# Patient Record
Sex: Male | Born: 1935 | Race: White | Hispanic: No | Smoking: Former smoker
Health system: Southern US, Community
[De-identification: ages and names within clinical notes are randomized; demographics above are authoritative.]

## PROBLEM LIST (undated history)

## (undated) DIAGNOSIS — Z8739 Personal history of other diseases of the musculoskeletal system and connective tissue: Secondary | ICD-10-CM

## (undated) DIAGNOSIS — N400 Enlarged prostate without lower urinary tract symptoms: Secondary | ICD-10-CM

## (undated) DIAGNOSIS — G5600 Carpal tunnel syndrome, unspecified upper limb: Secondary | ICD-10-CM

## (undated) DIAGNOSIS — K219 Gastro-esophageal reflux disease without esophagitis: Secondary | ICD-10-CM

## (undated) DIAGNOSIS — F419 Anxiety disorder, unspecified: Secondary | ICD-10-CM

## (undated) DIAGNOSIS — M25521 Pain in right elbow: Secondary | ICD-10-CM

## (undated) DIAGNOSIS — I951 Orthostatic hypotension: Secondary | ICD-10-CM

## (undated) DIAGNOSIS — I480 Paroxysmal atrial fibrillation: Secondary | ICD-10-CM

## (undated) DIAGNOSIS — N529 Male erectile dysfunction, unspecified: Secondary | ICD-10-CM

## (undated) DIAGNOSIS — R51 Headache: Secondary | ICD-10-CM

## (undated) DIAGNOSIS — G8929 Other chronic pain: Secondary | ICD-10-CM

## (undated) DIAGNOSIS — W57XXXA Bitten or stung by nonvenomous insect and other nonvenomous arthropods, initial encounter: Secondary | ICD-10-CM

## (undated) DIAGNOSIS — E782 Mixed hyperlipidemia: Secondary | ICD-10-CM

## (undated) DIAGNOSIS — M542 Cervicalgia: Secondary | ICD-10-CM

## (undated) DIAGNOSIS — E039 Hypothyroidism, unspecified: Secondary | ICD-10-CM

## (undated) DIAGNOSIS — M1990 Unspecified osteoarthritis, unspecified site: Secondary | ICD-10-CM

## (undated) DIAGNOSIS — R001 Bradycardia, unspecified: Secondary | ICD-10-CM

## (undated) DIAGNOSIS — I10 Essential (primary) hypertension: Secondary | ICD-10-CM

## (undated) DIAGNOSIS — I2510 Atherosclerotic heart disease of native coronary artery without angina pectoris: Secondary | ICD-10-CM

## (undated) DIAGNOSIS — E119 Type 2 diabetes mellitus without complications: Secondary | ICD-10-CM

## (undated) HISTORY — DX: Type 2 diabetes mellitus without complications: E11.9

## (undated) HISTORY — PX: CARPAL TUNNEL RELEASE: SHX101

## (undated) HISTORY — PX: OTHER SURGICAL HISTORY: SHX169

## (undated) HISTORY — DX: Male erectile dysfunction, unspecified: N52.9

## (undated) HISTORY — DX: Bradycardia, unspecified: R00.1

## (undated) HISTORY — DX: Essential (primary) hypertension: I10

## (undated) HISTORY — DX: Hypothyroidism, unspecified: E03.9

## (undated) HISTORY — PX: HERNIA REPAIR: SHX51

## (undated) HISTORY — PX: CARDIAC CATHETERIZATION: SHX172

## (undated) HISTORY — DX: Atherosclerotic heart disease of native coronary artery without angina pectoris: I25.10

## (undated) HISTORY — PX: CERVICAL FUSION: SHX112

## (undated) HISTORY — DX: Orthostatic hypotension: I95.1

## (undated) HISTORY — DX: Mixed hyperlipidemia: E78.2

## (undated) HISTORY — DX: Paroxysmal atrial fibrillation: I48.0

---

## 1999-11-19 ENCOUNTER — Encounter: Payer: Self-pay | Admitting: Neurosurgery

## 1999-11-21 ENCOUNTER — Inpatient Hospital Stay (HOSPITAL_COMMUNITY): Admission: RE | Admit: 1999-11-21 | Discharge: 1999-11-22 | Payer: Self-pay | Admitting: Neurosurgery

## 1999-11-21 ENCOUNTER — Encounter: Payer: Self-pay | Admitting: Neurosurgery

## 2000-03-01 ENCOUNTER — Encounter: Admission: RE | Admit: 2000-03-01 | Discharge: 2000-03-01 | Payer: Self-pay | Admitting: Neurosurgery

## 2000-03-01 ENCOUNTER — Encounter: Payer: Self-pay | Admitting: Neurosurgery

## 2004-02-28 ENCOUNTER — Inpatient Hospital Stay (HOSPITAL_BASED_OUTPATIENT_CLINIC_OR_DEPARTMENT_OTHER): Admission: RE | Admit: 2004-02-28 | Discharge: 2004-02-28 | Payer: Self-pay | Admitting: Cardiology

## 2004-03-03 ENCOUNTER — Inpatient Hospital Stay (HOSPITAL_COMMUNITY): Admission: RE | Admit: 2004-03-03 | Discharge: 2004-03-06 | Payer: Self-pay | Admitting: *Deleted

## 2004-03-19 ENCOUNTER — Ambulatory Visit: Payer: Self-pay | Admitting: Cardiology

## 2004-03-24 ENCOUNTER — Ambulatory Visit: Payer: Self-pay | Admitting: Cardiology

## 2004-06-24 ENCOUNTER — Ambulatory Visit: Payer: Self-pay | Admitting: Cardiology

## 2004-10-17 ENCOUNTER — Ambulatory Visit: Payer: Self-pay | Admitting: *Deleted

## 2004-10-17 ENCOUNTER — Inpatient Hospital Stay (HOSPITAL_BASED_OUTPATIENT_CLINIC_OR_DEPARTMENT_OTHER): Admission: RE | Admit: 2004-10-17 | Discharge: 2004-10-17 | Payer: Self-pay | Admitting: *Deleted

## 2004-10-31 ENCOUNTER — Ambulatory Visit: Payer: Self-pay | Admitting: Cardiology

## 2005-01-30 ENCOUNTER — Ambulatory Visit: Payer: Self-pay | Admitting: Cardiology

## 2005-05-27 ENCOUNTER — Ambulatory Visit: Payer: Self-pay | Admitting: Cardiology

## 2006-08-16 ENCOUNTER — Ambulatory Visit: Payer: Self-pay | Admitting: Physician Assistant

## 2006-08-16 ENCOUNTER — Encounter: Payer: Self-pay | Admitting: Cardiology

## 2007-04-14 ENCOUNTER — Ambulatory Visit: Payer: Self-pay | Admitting: Cardiology

## 2007-04-18 ENCOUNTER — Encounter: Payer: Self-pay | Admitting: Cardiology

## 2007-05-02 ENCOUNTER — Encounter: Payer: Self-pay | Admitting: Cardiology

## 2007-08-11 ENCOUNTER — Encounter: Payer: Self-pay | Admitting: Cardiology

## 2008-03-07 ENCOUNTER — Ambulatory Visit: Payer: Self-pay | Admitting: Cardiology

## 2009-03-28 ENCOUNTER — Ambulatory Visit: Payer: Self-pay | Admitting: Cardiology

## 2009-03-28 DIAGNOSIS — F528 Other sexual dysfunction not due to a substance or known physiological condition: Secondary | ICD-10-CM | POA: Insufficient documentation

## 2009-03-28 DIAGNOSIS — E785 Hyperlipidemia, unspecified: Secondary | ICD-10-CM

## 2009-03-28 DIAGNOSIS — I2510 Atherosclerotic heart disease of native coronary artery without angina pectoris: Secondary | ICD-10-CM | POA: Insufficient documentation

## 2009-03-28 DIAGNOSIS — I259 Chronic ischemic heart disease, unspecified: Secondary | ICD-10-CM | POA: Insufficient documentation

## 2009-03-28 DIAGNOSIS — I10 Essential (primary) hypertension: Secondary | ICD-10-CM | POA: Insufficient documentation

## 2009-03-28 DIAGNOSIS — E119 Type 2 diabetes mellitus without complications: Secondary | ICD-10-CM | POA: Insufficient documentation

## 2009-03-28 DIAGNOSIS — E039 Hypothyroidism, unspecified: Secondary | ICD-10-CM | POA: Insufficient documentation

## 2009-10-21 ENCOUNTER — Ambulatory Visit: Payer: Self-pay | Admitting: Cardiology

## 2010-06-12 NOTE — Assessment & Plan Note (Signed)
Summary: 6 mo fu per may reminder   Visit Type:  Follow-up Primary Provider:  none   History of Present Illness: the patient is a 77 year old male with a history of drug-eluting stent placement in 2005. The patient had nonobstructive coronary disease by catheterization in 2006. He is doing quite well. He denies any chest pain shortness of breath orthopnea PND. 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 He has not noticed any decrease in exercise tolerance. He reports no palpitations or syncope. The patient does report that he has frequent tick bites, but reports no fever or chills. He also reports no headache. The patient is intolerant to statins due to severe myalgia and weakness. His blood pressure is well under control. He was elevated in the office today but the patient takes his blood pressure at home several times during the week and it has been within normal limits.  Preventive Screening-Counseling & Management  Alcohol-Tobacco     Smoking Status: quit     Year Quit: 1970  Current Medications (verified): 1)  Actos 45 Mg Tabs (Pioglitazone Hcl) .... Take 1  Tablet By Mouth Once A Day 2)  Aspirin 81 Mg Tbec (Aspirin) .... Take 1 Tablet By Mouth Once A Day 3)  Nexium 40 Mg Cpdr (Esomeprazole Magnesium) .... Take 1 Tablet By Mouth Once A Day 4)  Levothyroxine Sodium 75 Mcg Tabs (Levothyroxine Sodium) .... Take 1 Tablet By Mouth Once A Day 5)  Cialis 10 Mg Tabs (Tadalafil) .... Take One Tab X One Prior To Sexual Activity.  Maximum of 20mg  in A 24 Hour Time Frame. 6)  Losartan Potassium 50 Mg Tabs (Losartan Potassium) .... Take 1 Tablet By Mouth Once A Day 7)  Hydrochlorothiazide 25 Mg Tabs (Hydrochlorothiazide) .... Take One Tablet By Mouth Daily. 8)  Amlodipine Besylate 5 Mg Tabs (Amlodipine Besylate) .... Take 1 Tablet By Mouth Once A Day  Allergies (verified): 1)  * Statins 2)  * Zetia  Comments:  Nurse/Medical Assistant: The patient's medication list and  allergies were reviewed with the patient and were updated in the Medication and Allergy Lists.  Past History:  Past Medical History: Last updated: 03/28/2009 1. Coronary artery disease.  The patient is stable.  He does not need     any testing at this time. 2. History of drug-eluting stent to the OM and right coronary in 2005     with followup cath in 2006 with only minimal disease and no     significant stent stenoses. 3. Dyslipidemia.  He is intolerant to statins and Zetia and red yeast     rice.  I have recommended fish oil to him. 4. Normal LV function. 5. Diabetes. 6. Asymptomatic sinus bradycardia and therefore he is off beta-blocker     therapy. 7. Hypothyroidism.  This is treated by Dr. Virgina Organ. 8. Hypertension.  Blood pressure is nicely controlled. 9. History of erectile dysfunction.   Family History: Last updated: 03/06/2008 noncontributory  Social History: Last updated: 03/06/2008 Tobacco Use - Yes.   Risk Factors: Smoking Status: quit (10/21/2009)  Vital Signs:  Patient profile:   77 year old male Height:      72 inches Weight:      237 pounds Pulse rate:   61 / minute BP sitting:   150 / 79  (left arm) Cuff size:   large  Vitals Entered By: Carlye Grippe (October 21, 2009 9:14 AM)  Physical Exam  Additional Exam:  General: Well-developed,  well-nourished in no distress head: Normocephalic and atraumatic eyes PERRLA/EOMI intact, conjunctiva and lids normal nose: No deformity or lesions mouth normal dentition, normal posterior pharynx neck: Supple, no JVD.  No masses, thyromegaly or abnormal cervical nodes lungs: Normal breath sounds bilaterally without wheezing.  Normal percussion heart: regular rate and rhythm with normal S1 and S2, no S3 or S4.  PMI is normal.  No pathological murmurs abdomen: Normal bowel sounds, abdomen is soft and nontender without masses, organomegaly or hernias noted.  No hepatosplenomegaly musculoskeletal: Back normal, normal  gait muscle strength and tone normal pulsus: Pulse is normal in all 4 extremities Extremities: No peripheral pitting edema neurologic: Alert and oriented x 3 skin: Intact without lesions or rashes cervical nodes: No significant adenopathy psychologic: Normal affect    Impression & Recommendations:  Problem # 1:  ESSENTIAL HYPERTENSION, BENIGN (ICD-401.1) controlled and continue current medical regimen. The following medications were removed from the medication list:    Nifedical Xl 60 Mg Xr24h-tab (Nifedipine) .Marland Kitchen... Take 1 tablet by mouth once a day    Diovan Hct 160-12.5 Mg Tabs (Valsartan-hydrochlorothiazide) .Marland Kitchen... Take 1 tablet by mouth once a day His updated medication list for this problem includes:    Aspirin 81 Mg Tbec (Aspirin) .Marland Kitchen... Take 1 tablet by mouth once a day    Losartan Potassium 50 Mg Tabs (Losartan potassium) .Marland Kitchen... Take 1 tablet by mouth once a day    Hydrochlorothiazide 25 Mg Tabs (Hydrochlorothiazide) .Marland Kitchen... Take one tablet by mouth daily.    Amlodipine Besylate 5 Mg Tabs (Amlodipine besylate) .Marland Kitchen... Take 1 tablet by mouth once a day  Problem # 2:  CORONARY ARTERY DISEASE, S/P PTCA (ICD-414.9) the patient has no symptoms of chest pain or shortness of breath. He has good exercise tolerance. I do not see any real reason to perform a stress test. The following medications were removed from the medication list:    Nifedical Xl 60 Mg Xr24h-tab (Nifedipine) .Marland Kitchen... Take 1 tablet by mouth once a day His updated medication list for this problem includes:    Aspirin 81 Mg Tbec (Aspirin) .Marland Kitchen... Take 1 tablet by mouth once a day    Amlodipine Besylate 5 Mg Tabs (Amlodipine besylate) .Marland Kitchen... Take 1 tablet by mouth once a day  Problem # 3:  DYSLIPIDEMIA (ICD-272.4) the patient is a statin intolerance is also intolerant to a red yeast rice.  Patient Instructions: 1)  Your physician wants you to follow-up in: 1 year. You will receive a reminder letter in the mail one-two months in  advance. If you don't receive a letter, please call our office to schedule the follow-up appointment.  2)  Your physician recommends that you continue on your current medications as directed. Please refer to the Current Medication list given to you today. Prescriptions: AMLODIPINE BESYLATE 5 MG TABS (AMLODIPINE BESYLATE) Take 1 tablet by mouth once a day  #31 x 12   Entered by:   Cyril Loosen, RN, BSN   Authorized by:   Lewayne Bunting, MD, [facility]   Signed by:   Cyril Loosen, RN, BSN on 10/21/2009   Method used:   Electronically to        [facility]* (retail)       [address]       [place], [place]  [address]       Ph: [phone]       Fax: [phone]   RxID:   [id number] HYDROCHLOROTHIAZIDE  25 MG TABS (HYDROCHLOROTHIAZIDE) Take one tablet by mouth daily.  #31 x 12   Entered by:   Cyril Loosen, RN, BSN   Authorized by:   Lewayne Bunting, MD, [facility]   Signed by:   Cyril Loosen, RN, BSN on 10/21/2009   Method used:   Electronically to        [facility]* (retail)       [address]       [place], [place]  [address]       Ph: [phone]       Fax: [phone]   RxID:   [id number] LOSARTAN POTASSIUM 50 MG TABS (LOSARTAN POTASSIUM) Take 1 tablet by mouth once a day  #31 x 12   Entered by:   Cyril Loosen, RN, BSN   Authorized by:   Lewayne Bunting, MD, [facility]   Signed by:   Cyril Loosen, RN, BSN on 10/21/2009   Method used:   Electronically to        [facility]* (retail)       [address]       [place], [place]  [address]       Ph: [phone]       Fax: [phone]   RxID:   [id number]

## 2010-09-23 NOTE — Assessment & Plan Note (Signed)
[facility]                          [facility] NOTE   NAME:Adam Peck, Adam Peck                        MRN:          [id number]  DATE:04/14/2007                            DOB:          Jul 23, 1933    REASON FOR VISIT:  Six-month followup.   HISTORY OF PRESENT ILLNESS:  [name] is a 77 year old male patient  with a history of coronary artery disease, status post drug-eluting  stent to the obtuse marginal and RCA in October 2005, who presents to  the office today for followup.  Overall, he is doing well.  He denies  any chest discomfort since last being seen.  He denies any significant  exertional dyspnea.  He describes NYHA Class I-II symptoms.  He denies  orthopnea, PND or pedal edema.  He denies any syncope or palpitations.  He does have some difficulty with erectile dysfunction.  He can obtain  an erection, but cannot maintain it.  He denies any numbness or  difficulty with ejaculation.   CURRENT MEDICATIONS:  1. Fenofibrate 160 mg daily.  2. Plavix 75 mg daily.  3. Actos 45 mg half tablet daily.  4. Nifedical XL 60 mg daily.  5. Aspirin 81 mg daily.  6. Levothyroxine 50 mcg daily.  7. Red yeast rice.  8. Cinnamon.  9. Benicar/HCTZ 40/12.5 mg half a tablet a day.  10.Nexium 40 mg daily.  11.Nitroglycerin p.r.n. chest pain.   ALLERGIES:  No known drug allergies.  He is intolerant to all STATINS  AND ZETIA.   PHYSICAL EXAMINATION:  GENERAL:  He is a well-developed, well-nourished  male.  VITAL SIGNS:  Blood pressure 151/79, pulse 54, weight 236.4 pounds.  HEENT:  Normal.  NECK:  Without JVD.  CARDIAC:  Normal S1, S2.  Regular rate and rhythm without murmur.  LUNGS:  Clear to auscultation bilaterally without wheezes, rhonchi or  rales.  ABDOMEN:  Soft, nontender with normoactive bowel sounds.  No  organomegaly.  EXTREMITIES:  Without edema.  NEUROLOGIC:  He is alert and oriented x3.  Cranial nerves 2-12 grossly  intact.  VASCULAR:  Without carotid bruits bilaterally.   IMPRESSION:  1. Coronary artery disease.      a.     Status post drug-eluting stent placement to the obtuse       marginal and right coronary artery in 2005.      b.     No evidence of restenosis by elective cardiac       catheterization in June 2006, as part of the [facility] protocol.  2. Dyslipidemia.  Intolerant to Statins and Zetia in the past.  3. Preserved left ventricular function with ejection fraction of 60%.  4. Diabetes mellitus.  5. Asymptomatic sinus bradycardia, off of beta-blocker now.  6. Treated hypothyroidism.  7. Hypertension.  8. Erectile dysfunction.   The patient is doing well from a cardiovascular standpoint.  He reports  no chest pain or significant shortness of breath.  He does have  difficulty with erectile dysfunction and is requesting Cialis at this  point in time.   PLAN:  1. Increase  his Benicar/HCTZ to 40/12.5 mg one tablet daily for better      blood pressure control.  2. Initiate Cialis 5 mg daily p.r.n., #10, one refill.  I have      explained to him that he cannot take nitrates with this.  He      understands the dangers.  3. He will need a BMET in a week to follow up on his renal function      and potassium with increase in his Benicar/HCTZ.  Will also check a      testosterone level at that time.  4. Follow up in 6 months or sooner p.r.n.      Tereso Newcomer, PA-C  Electronically Signed      Learta Codding, MD,FACC  Electronically Signed   SW/MedQ  DD: 04/14/2007  DT: 04/14/2007  Job #: [id number]   cc:   [place]

## 2010-09-23 NOTE — Assessment & Plan Note (Signed)
[facility]                          [facility]ICE NOTE   NAME:Silverman, BRAXSON HOLLINGSWORTH                        MRN:          [id number]  DATE:03/07/2008                            DOB:          09/26/1933    Mr. Lynes is followed primarily by Dr. Andee Lineman.  I am seeing him for  followup for our office and Dr. Andee Lineman today.  Mr. Mirsky is actually  doing well.  He is seen for the followup of coronary disease and  dyslipidemia and hypertension and asymptomatic sinus bradycardia.  The  patient has drug-eluting stents that were placed in 2005.  Followup cath  in 2006 revealed no major stenoses of the stents.  He is fully active.  He is not having any chest pain or significant shortness of breath.  Unfortunately, the patient is intolerant to statins and Zetia, and in  fact, he felt poorly with red yeast rice.  I have discussed this with  him.  See the note below.  The patient has a history of sinus  bradycardia.  This is asymptomatic.  He has not had any syncope or  presyncope.  The patient does have hypertension.  He is on appropriate  medications and feeling well.  See the physical exam below.   PAST MEDICAL HISTORY:   ALLERGIES:  He feels poorly with CHOLESTEROL MEDS, but has no definite  allergies.   MEDICATIONS:  1. Plavix 75 mg (to be stopped at this time).  2. Actos 45 mg one-half tablet daily.  3. Nifedical 60.  4. Aspirin 81.  5. Nexium 40.  6. Flomax 0.4.  7. Ramipril 10.  8. Levothyroxine 75 mcg.   OTHER MEDICAL PROBLEMS:  See the complete list below.   REVIEW OF SYSTEMS:  At this time, he is not having any GI or GU  symptoms.  He has no skin rashes or headaches.  His review of systems  otherwise is negative.  The patient is having some difficulty with one  of his knees.  He also mentions vague discomfort in his right wrist.  This does not seem to be vascular in origin.   PHYSICAL EXAMINATION:  VITAL SIGNS:  Blood pressure today is  112/60.  Heart rate is 84.  Weight is stable at 237 pounds.  GENERAL:  The patient is oriented to person, time, and place.  Affect is  normal.  HEENT:  No xanthelasma.  He has normal extraocular motion.  NECK:  There are no carotid bruits.  There is no jugular venous  distention.  LUNGS:  Clear.  Respiratory effort is not labored.  CARDIAC:  An S1 with an S2.  There are no clicks or significant murmurs.  ABDOMEN:  Soft.  There are no masses or bruits.  EXTREMITIES:  He has no peripheral edema.  The patient has normal pulse  at the right wrist.   LABORATORY DATA:  No labs are done today.   PROBLEMS:  1. Coronary artery disease.  The patient is stable.  He does not need      any testing at this time.  2.  History of drug-eluting stent to the OM and right coronary in 2005      with followup cath in 2006 with only minimal disease and no      significant stent stenoses.  3. Dyslipidemia.  He is intolerant to statins and Zetia and red yeast      rice.  I have recommended fish oil to him.  4. Normal LV function.  5. Diabetes.  6. Asymptomatic sinus bradycardia and therefore he is off beta-blocker      therapy.  7. Hypothyroidism.  This is treated by Dr. Virgina Organ.  8. Hypertension.  Blood pressure is nicely controlled.  9. History of erectile dysfunction.   I have had a very careful discussion about the pros and cons of  continuing Plavix.  He is now 4 years post procedure.  He did not have  complex bifurcation lesions or left main lesions.  Therefore, I feel it  is safe for him to stop his Plavix.  He will let his current dose run  out and not have it refilled.   He will obtain some fish oil and we will see him back in 1 year for  followup.     Luis Abed, MD, [facility]  Electronically Signed    JDK/MedQ  DD: 03/07/2008  DT: 03/07/2008  Job #: [id number]   cc:   Colon Branch, MD

## 2010-09-26 NOTE — Cardiovascular Report (Signed)
NAME:  KAULDER, ZAHNER                 ACCOUNT NO.:  [id number]   MEDICAL RECORD NO.:  [id number]          PATIENT TYPE:  OIB   LOCATION:  6501                         FACILITY:  [facility]   PHYSICIAN:  Carole Binning, M.D. LHCDATE OF BIRTH:  1934/01/02   DATE OF PROCEDURE:  10/17/2004  DATE OF DISCHARGE:                              CARDIAC CATHETERIZATION   PROCEDURE PERFORMED:  Left heart catheterization with coronary angiography  and left ventriculography.   INDICATIONS:  The patient is a 77 year old male coronary artery disease. In  October of 2005, he underwent two-vessel stent placement. He had a drug-  eluting stent placed in the first obtuse marginal branch. He then had a  staged intervention with a drug-eluting stent placed in the distal right  coronary artery. The stent in the distal right coronary was placed as part  of the COSTAR study. The patient returns today for study protocol mandated  coronary angiography. Since the stent placement, he has had occasional  intermittent chest pain which has been nonexertional in nature.   PROCEDURE NOTE:  A 6-French sheath was placed in the right femoral artery.  The left coronaries were imaged with a 6-French JL-5 catheter. Right  coronary artery is imaged with a 6-French JR-4 catheter. There is mild  catheter induced spasm of the proximal right coronary artery which was  relieved with intracoronary nitroglycerin. Left ventriculography was  performed with an angled pigtail catheter. Contrast was Omnipaque. At the  conclusion of procedure, a 6-French Angio-Seal vascular closure device was  placed in the right femoral artery with good hemostasis. There are no  complications.   RESULTS:  1.  Hemodynamics: Left ventricular pressure 134/22. Aortic pressure 136/78.      There is no aortic valve gradient.  2.  Left ventriculogram: Wall motion is normal, ejection fraction is      estimated greater than or equal to 60%. There is no mitral    regurgitation.  3.  Coronary arteriography:      1.  Left main is normal.      2.  Left anterior descending artery has a diffuse 50-60% stenosis in the          mid vessel. The distal vessel has a 20% stenosis. The LAD gives rise          to single large diagonal branch.      3.  Left circumflex has a 20% stenosis in the proximal vessel just after          the origin of the first obtuse marginal. The first obtuse marginal,          itself, is a large vessel. There is a stent in the very proximal          portion of the obtuse marginal which is widely patent with 0%          stenosis within the stent. Just distal to the stent, there is a 20%          stenosis. The circumflex also gives rise to large second obtuse  marginal branch. There is a 20% stenosis at the origin of the second          obtuse marginal and tubular 40% stenosis in the mid body of the          second obtuse marginal branch. There is a small third obtuse          marginal branch arising from the distal left circumflex.      4.  Right coronary artery is dominant vessel. There is significant          tortuosity of the proximal mid vessel. In the proximal vessel, there          is a 30% stenosis. In the mid vessel, there is a 40% stenosis. There          is a stent in the distal vessel at the acute margin. The stent is          widely patent with 0% stenosis within the stent. The distal right          coronary gives rise to large posterior descending artery and a          series of small posterolateral branches. The posterior descending          artery has a 20% stenosis proximally and a 30% stenosis in the mid          portion.   IMPRESSION:  1.  Normal left ventricular systolic function.  2.  Patent stents in the obtuse marginal branch in the right coronary      artery.  3.  Moderate residual but nonobstructive coronary artery disease.   PLAN:  The patient will be continued on medical therapy.        MWP/MEDQ  D:  10/17/2004  T:  10/17/2004  Job:  [id number]   cc:   [facility]   Earnestine Mealing, M.D.  [place], [place]

## 2010-09-26 NOTE — Assessment & Plan Note (Signed)
[name]                          [facility] OFFICE NOTE   NAME:[name], Adam Peck                        MRN:          [id number]  DATE:08/16/2006                            DOB:          29-Feb-1934    PRIMARY CARE PHYSICIANS:  Primary care physician is Dr. Prescott Parma.  Cardiologist is Learta Codding, M.D.   HISTORY OF PRESENT ILLNESS:  [name] is a very pleasant 77 year old  male patient followed by Dr. Andee Lineman with history of coronary artery  disease, status post drug-eluting stent placement to the obtuse marginal  and right coronary artery in October 2005 with a drug-eluting stent who  had no evidence of re-stenosis at followup catheterization June of 2006.  The patient presents back to the office today for routine followup.  He  is doing well overall.  He notes occasional left sided chest discomfort  that is atypical.  He says that this is associated with eating certain  types of meals, especially peanuts. He says he does have a history of  hiatal hernia.  He denies any exertional symptoms.  He denies any  exertional shortness of breath, syncope or near syncope.  He denies any  nausea or diaphoresis.  He denies any orthopnea or paroxysmal nocturnal  dyspnea.  He says he checks his blood pressures at home and they are  usually in the 120/80 range  He says he is cold a lot of the time. He  says he recently had laboratory work with Dr. Dyann Ruddle which included a  survey of his thyroid replacement.  He sees Dr. Dyann Ruddle back next week  for routine followup.   CURRENT MEDICATIONS:  1. Actos 45 mg one-half tablet daily.  2. Nexium 40 mg daily.  3. Plavix 75 mg daily.  4. Aspirin 81 mg daily.  5. Levothyroxine 50 mcg one-half tablet daily.  6. Metoprolol tartrate 50 mg; he is only taking this once a day.  7. Nifedical 60 mg daily.   ALLERGIES:  He has significant myalgias secondary to all STATIN's and  ZETIA.   PHYSICAL EXAMINATION:   GENERAL APPEARANCE:  He is a well-nourished, well-  developed male in no acute distress.  VITAL SIGNS:  Blood pressure 156/84, pulse 51, weight 238 pounds.  Repeat blood pressure by me is 150/90 on the left, 146/90 on the right.  HEENT:  Unremarkable.  NECK: Without jugular venous distention.  CARDIAC:  Normal S1 and S2.  Regular rate and rhythm without murmurs.  LUNGS:  Clear to auscultation bilaterally without wheezing, rhonchi or  rales.  Carotids without bruits bilaterally.  ABDOMEN:  Soft, nontender normal bowel sounds, no organomegaly.  EXTREMITIES:  Without edema.  Calves are soft and nontender.  SKIN:  Warm and dry.  NEUROLOGICAL:  He is alert and oriented x3.  Cranial nerves II-XII are  grossly intact.  Dorsalis pedis pulse and posterior tibialis pulses are  2+ bilaterally.   CLINICAL DATA:  Electrocardiogram reveals sinus bradycardia with heart  rate of 51.  No significant change since previous tracing.  Occasionally  PVC's.   IMPRESSION:  1.  Coronary artery disease.      a.     Status post drug-eluting stent to the obtuse marginal and       right coronary artery in October 2005.      b.     No evidence of re-stenosis by elective cardiac       catheterization June of 2006, as part of the [facility] protocol.  2. Dyslipidemia.      a.     INTOLERANT OF STATIN's and ZETIA secondary to severe       myalgias.  3. Good left ventricular function with ejection fraction of 60% by      last cardiac catheterization.  4. Diabetes mellitus.  5. Asymptomatic sinus bradycardia.  6. Treated hypothyroidism.  7. Hypertension, suboptimal control.  8. Atypical chest pain.   PLAN:  The patient presents back to the office today for routine  followup.  He does note some atypical chest pains.  These are clearly  associated with meals.  They are nothing like what he had prior to his  stent placement in 2005.  He denies any exertional symptoms.  At this  point in time no further work up is  warranted.  We will continue his  medical therapy.  I think his chest pain is probably related to his  gastroesophageal reflux disease and hiatal hernia.  He sees Dr. Dyann Ruddle  next week for routine followup.  He should discuss this further with  him.  I explained to the patient that if his symptoms should change over  time then he should contact us and we will see him back for earlier  follow-up.   He is taking his metoprolol tartrate 50 mg once a day.  He should  actually be taking this twice a day and I have recommended he change  that to half a tablet twice a day.  I do not think we should go up any  further on his dose of beta blocker as he is already bradycardic in the  50 range.  His blood pressures should be better controlled as he is a  diabetic.  His goal is less than 130/80.  He does see Dr. Dyann Ruddle next  week.  He apparently had some problems with what sounds like an ACE  inhibitor-induced cough.  He also says he stopped Micardis secondary to  itching, but then later found out that this was related to coffee cream  he was using.  He stopped that coffee cream and the itching subsided.  He does check his blood pressures at home and they sound to be optimal  at home.  I have asked him to keep a diary of his blood pressures, bring  his cuff in with him when he sees Dr. Dyann Ruddle next week.  Further  recommendations could be made regarding his blood pressure at that time.   He will continue on aspirin and Plavix indefinitely.  He is INTOLERANT  TO STATIN's and will continue him on red yeast rice.  Will try to obtain  lipid panel results from Dr. Rollen Sox office.  His goal LDL would be less than 70 with a history of coronary artery  disease.  Will have him followup in six months for routine followup or  sooner as necessary.      Tereso Newcomer, PA-C  Electronically Signed      Luis Abed, MD, [facility]  Electronically Signed   SW/MedQ  DD: 08/16/2006  DT: 08/16/2006  Job  #: [id number]  cc:   Nevin Bloodgood, MD,FACC

## 2010-09-26 NOTE — Cardiovascular Report (Signed)
NAMESOLON, ALBAN                 ACCOUNT NO.:  [id number]   MEDICAL RECORD NO.:  [id number]          PATIENT TYPE:  INP   LOCATION:  2899                         FACILITY:  [facility]   PHYSICIAN:  Carole Binning, M.D. LHCDATE OF BIRTH:  12-01-1933   DATE OF PROCEDURE:  03/03/2004  DATE OF DISCHARGE:                              CARDIAC CATHETERIZATION   PROCEDURE PERFORMED:  Percutaneous transluminal coronary angioplasty with  placement of a drug-eluting stent in the first obtuse marginal branch.   INDICATION:  Mr. Kabir is a 77 year old male with diabetes.  He has had  recurrent episodes of chest pain.  Cardiac catheterization performed last  week by Dr. Antoine Poche revealed two-vessel disease with 90% stenosis in the  first obtuse marginal branch and 80% stenosis in the right coronary artery.  The patient returned today for percutaneous coronary intervention.  Of note,  he has diabetes mellitus and his creatinine was upper range of normal.  The  patient was therefore pretreated with bicarbonate.   PROCEDURAL NOTE:  A 6 French sheath was placed in the right femoral artery.  We initially performed diagnostic left coronary angiography with a 6 French  JL-4 catheter.  This did confirm that there was a 90% stenosis in the first  obtuse marginal branch with TIMI-2 flow beyond this.  Angiomax was then  administered.  We then used a 6 [place] CLS 3.5 guiding catheter.  An Asahi  soft coronary guide wire was advanced under fluoroscopic guidance into the  distal aspect of the first obtuse marginal branch.  We then performed  percutaneous transluminal coronary angioplasty with a 2.25 x 12-mm Quantum  balloon inflated to 10 atmospheres.  We then deployed a  2.5 x 13-mm Cypher drug-eluting stent across the stenosis with care to place  the proximal edge of the stent in the ostium of the vessel.  This stent was  deployed at 8 atmospheres.  Following this we went in with a 2.25 x 12-mm  Quantum  balloon and inflated this to 16 atmospheres within the stent.  Intermittent doses of intracoronary nitroglycerin were administered.  Final  angiographic images were obtained revealing patency of the obtuse marginal  branch with 0% residual stenosis and TIMI-3 flow.   COMPLICATIONS:  None.   RESULTS:  Successful percutaneous transluminal coronary angioplasty with  placement of a drug-eluting stent in the first obtuse marginal branch.  A  90% stenosis with TIMI-2 flow was reduced to 0% residual with TIMI-3 flow.   PLAN:  Angiomax will be discontinued at this point.  The patient will be  treated with Plavix for recommended 6-12 months.  We anticipate proceeding  with staged percutaneous coronary intervention of the right coronary artery  in approximately 48 hours if his creatinine remains stable.       MWP/MEDQ  D:  03/03/2004  T:  03/03/2004  Job:  [id number]   cc:   Dr. Liliane Bade in [facility] in [place]

## 2010-09-26 NOTE — Op Note (Signed)
[facility]  Patient:    SHRAY, HUNLEY                        MRN: [id number] Proc. Date: 11/21/99 Adm. Date:  [id number] Attending:  Emeterio Reeve                           Operative Report  PREOPERATIVE DIAGNOSIS:  Herniated disk on the left side at L4-5.  POSTOPERATIVE DIAGNOSIS:  Herniated disk on the left side at L4-5.  OPERATIVE PROCEDURE:  L4-5 laminectomy and diskectomy.  SURGEON:  Payton Doughty, M.D.  ANESTHESIA:  General endotracheal.  PREPARATION:  Prepped with sterile Betadine, and prepped and scrubbed with alcohol wipe.  COMPLICATIONS:  None.  INDICATIONS:  A 77 year old gentleman with left L5 radiculopathy and a herniated disk at 4-5.  DESCRIPTION OF PROCEDURE:  The patient was taken to the operating room, smoothly anesthetized and intubated, and placed prone on the operating table. Following shave, prep, and drape in the usual sterile fashion, the skin was infiltrated with 1% lidocaine with 1:400,000 epinephrine.  The old skin incision was not used as it was off to the right side.  A skin incision was made from the bottom of L3 to the top of L5, and the lamina of L4 and the superior lamina of L5 were exposed on the left side in the subperiosteal plane.  Intraoperative x-ray confirmed correctness of level.  A hemi-semilaminectomy of L4 was carried out to the top of the ligamentum flavum which was removed in a retrograde fashion.  The L5 was under cut.  The L5 root was identified as it coursed around the 5 pedicle and it was gently dissected free.  Immediately underneath it demonstrated a large free fragment of disk extruding from the L4-5 disk space.  It was grasped and removed without difficulty _______ relaxation of the nerve.  Following removal of this disk fragment, the disk space was carefully explored and all graspable fragments removed.  The anterior epidural space was compressed all the way around to try and milk  fragments out if they existed. None were found.  The nerve root was carefully explored in all quadrants and found to be open.  The lateral recesses explored and found to be open.  The wound was therefore irrigated and hemostasis was assured.  Depo-Medrol soaked fat was used to cover the laminectomy defect.  The fascia was reapproximated with 0 Vicryl in an interrupted fashion.  The subcutaneous tissue was reapproximated with 0 Vicryl in an interrupted fashion.  The subcuticular tissues were reapproximated with 3-0 Vicryl in an interrupted fashion.  The skin was closed with a 4-0 Vicryl in a running subcuticular fashion.  Benzoin and Steri-Strips were placed and made occlusive with Telfa and Op-Site.  The patient was then returned to the recovery room in good condition. DD:  11/21/99 TD:  11/22/99 Job: [id number]

## 2010-09-26 NOTE — Cardiovascular Report (Signed)
NAME:  Adam Peck, Adam Peck                 ACCOUNT NO.:  [id number]   MEDICAL RECORD NO.:  [id number]          PATIENT TYPE:  OIB   LOCATION:  6501                         FACILITY:  [facility]   PHYSICIAN:  Rollene Rotunda, M.D.   DATE OF BIRTH:  [birth year]   DATE OF PROCEDURE:  02/28/2004  DATE OF DISCHARGE:                              CARDIAC CATHETERIZATION   PROCEDURE:  Elective heart catheterization/coronary arteriography.   INDICATIONS FOR PROCEDURE:  Evaluate the patient with chest pain and  abnormal Cardiolite, suggesting mild inferolateral ischemia.   DESCRIPTION OF PROCEDURE:  The left heart catheterization was performed via  the right femoral artery. The artery was cannulated using a femoral  puncture. A #4 French arterial sheath was inserted via the modified  Seldinger technique. A preformed Judkins and a pigtail catheter were  utilized. The patient tolerated the procedure well and left the lab in  stable condition.   HEMODYNAMIC RESULTS:  Left ventricular 134/18, AO 135/94.   CORONARIES:  The left main was normal. The left anterior descending had  proximal long 40% to 50% stenosis. There was mid long 30% stenosis. Her  first diagonal was large with diffuse luminal irregularities. The circumflex  in the AV groove was normal. There was a ramus intermediate, which was a  somewhat narrow caliber 1.5 to 2 mm vessel with long proximal 75% stenosis.  There was an OM-1, which was large and normal. Her posterolateral was  moderate size and normal. The right coronary artery was a dominant vessel.  There were diffuse luminal irregularities. There was a proximal 30%  stenosis. There was mid 75% to 80% stenosis. There was a moderate size PDA  and small posterolateral's, which were free of high grade disease.   LEFT VENTRICULOGRAM:  The left ventriculogram was obtained in the RAO  projection. The ejection fraction was 65% with normal wall motion.   CONCLUSION:  Three vessel coronary artery  disease with abnormal Cardiolite.   PLAN:  I will review the films for possible percutaneous revascularization  of the right coronary artery and possibly the ramus intermediate as well.      JH/MEDQ  D:  02/28/2004  T:  02/28/2004  Job:  [id number]   cc:   Learta Codding, M.D. [facility], M.D.

## 2010-09-26 NOTE — Discharge Summary (Signed)
NAMEJACERE, Adam Peck                 ACCOUNT NO.:  [id number]   MEDICAL RECORD NO.:  [id number]          PATIENT TYPE:  INP   LOCATION:  6523                         FACILITY:  [facility]   PHYSICIAN:  Carole Binning, M.D. LHCDATE OF BIRTH:  [birth year]   DATE OF ADMISSION:  03/03/2004  DATE OF DISCHARGE:  03/05/2004                           DISCHARGE SUMMARY - REFERRING   PROCEDURES:  1.  Percutaneous intervention of first obtuse marginal on March 03, 2004.  2.  Percutaneous intervention of right coronary artery on March 05, 2004.   REASON FOR ADMISSION:  [name] is a 77 year old male with recent  diagnostic coronary angiography revealing significant two-vessel coronary  artery disease, who returned for elective staged percutaneous intervention.   LABORATORY DATA:  BUN 22 and creatinine 1.5 on admission and 23/1.3,  respectively, predischarge.  Potassium low at 3.3-3.6 predischarge.  Normal  liver enzymes.  Normal hemoglobin/hematocrit, marginally elevated WBC at  11.5 with normal platelets.  Cardiac enzymes:  CPK-MB and troponin normal (x  2).   HOSPITAL COURSE:  The patient presented for elective percutaneous  intervention on March 03, 2004, and was pretreated with sodium bicarbonate  given the elevated creatinine (125) on admission.   The patient underwent successful stenting (Cypher) of a 90% first obtuse  marginal lesion to 0% residual stenosis.  There were no noted complications.   The patient was kept in the hospital for 48 hours to allow for adequate  hydration and stabilization of renal insufficiency.  He then was pretreated  with sodium bicarbonate once again prior to returning for PCI of the RCA.   The patient underwent successful stenting (Costar versus Taxus) of an 80%  distal RCA lesion to 0% residual stenosis.  There were no noted  complications.   The patient will need to remain on Plavix for one year.   The patient is cleared for tentative discharge on  March 06, 2004.   MEDICATIONS AT DISCHARGE:  1.  Plavix 75 mg daily (at least one year).  2.  Coated aspirin 325 mg daily.  3.  Toprol 50 mg daily.  4.  Micardis HCT 80/12.5 mg daily.  5.  DynaCirc 5 mg daily.  6.  Actos 45 mg one-half tablet daily.  7.  Vytorin 10/20 mg daily.  8.  Nexium 40 mg daily.  9.  Nitrostat 0.4 mg p.r.n.   ACTIVITY:  No heavy lifting (greater than 10 pounds) x 1 week.   WOUND CARE:  Call the office if there is any swelling/bleeding of the groin.   FOLLOWUP:  The patient will follow up with Learta Codding, M.D., on  Wednesday, February 17, 2004, at 1:30 p.m. at the [facility] in  [place].   DISCHARGE DIAGNOSES:  1.  Severe two-vessel coronary artery disease.      1.  Status post Cypher stenting of 90% first obtuse marginal on March 03, 2004, and stenting (Costar study) of 80% distal right coronary          artery on March 05, 2004.      2.  Normal left ventricular function.  2.  Type 2 diabetes mellitus.  3.  Dyslipidemia.  4.  Hypertension.  5.  Remote tobacco.       GS/MEDQ  D:  03/05/2004  T:  03/05/2004  Job:  [id number]   cc:   [facility]  [address]  [address]  [place], [place] [address]   Prescott Parma  [address] [place]  [place] [address]  Fax: [phone]

## 2010-09-26 NOTE — Cardiovascular Report (Signed)
NAME:  Adam Peck, Adam Peck                 ACCOUNT NO.:  [id number]   MEDICAL RECORD NO.:  [id number]          PATIENT TYPE:  INP   LOCATION:  6523                         FACILITY:  [facility]   PHYSICIAN:  Carole Binning, M.D. LHCDATE OF BIRTH:  07/07/1933   DATE OF PROCEDURE:  03/05/2004  DATE OF DISCHARGE:                              CARDIAC CATHETERIZATION   PROCEDURE PERFORMED:  1.  Selective coronary angiography of the left coronary artery.  2.  Percutaneous transluminal coronary angioplasty (PTCA) with placement of      a drug-eluting stent in the distal right coronary artery.   INDICATIONS:  Mr. Molina is a 77 year old male who has had progressive angina  including episodes of angina at rest.  He had a cardiac catheterization late  last week which revealed 2-vessel coronary artery disease.  Two days ago he  underwent placement of a drug-eluting stent in the first obtuse marginal  branch.  Because of mild renal insufficiency, we opted to stage intervention  of the right coronary artery for daily.  His creatinine did remain stable  over the past 48 hours.   PROCEDURE NOTE:  A 7 French sheath was placed in the left femoral artery.  We initially performed selective coronary angiography of the left coronary  artery with a 6 French JL-4 catheter.  This revealed a widely patent stent  in the first obtuse marginal branch with 0% stenosis within the stent.   We then turned our attention to the right coronary artery.  Angiomax was  administered per protocol.  We used a 7 [place] JR-4 guiding catheter with  side holes.  We initially attempted to across the lesion with an Asahi soft  coronary guidewire; however, due to excessive proximal tortuosity of the  vessel we were unable to cross beyond the lesion.  Likewise we were  unsuccessful in attempting to cross with an Office manager.  We then  used a Luge wire and we were successful in crossing the lesion with a Luge  wire.  We advanced the  Luge into the distal portion of the distal right  coronary artery.   The patient was enrolled in the [facility] and treated with randomized  stent per protocol.  We initially performed PTCA with a 3.0 x 12 mm quantum  balloon inflated to 10 atmospheres.  We then positioned a 3.0 x 16 mm study  stent across the stenosis and deployed this stent at 13 atmospheres.  Following this we went back in with  3.25 x 15 mm Quantum balloon within the  stent and inflated this balloon to 16 atmospheres.  Intermittent doses of  intracoronary nitroglycerin and verapamil were administered to improve  coronary perfusion.  Final angiographic images are obtained revealing  patency to the right coronary artery with 0% residual stenosis at the stent  site and a TIMI 3 flow into the distal vessel.   COMPLICATIONS:  None.   RESULTS:  Successful PTCA with placement of a drug-eluting stent in the  distal right coronary artery and 80% stenosis was reduced to 0% residual  TIMI 3 flow.  PLAN:  Angiomax will be discontinued.  The patient should remain on Plavix  and aspirin combination for a total of 1 year followed by life-long aspirin  therapy.       MWP/MEDQ  D:  03/05/2004  T:  03/05/2004  Job:  [id number]   cc:   Cardiac Cath Lab   Lynn Ito, M.D.  [place], [place].

## 2010-10-23 ENCOUNTER — Encounter: Payer: Self-pay | Admitting: Cardiology

## 2010-10-24 ENCOUNTER — Other Ambulatory Visit: Payer: Self-pay | Admitting: *Deleted

## 2010-10-24 MED ORDER — LOSARTAN POTASSIUM 50 MG PO TABS: 50.0000 mg | ORAL_TABLET | Freq: Every day | ORAL | Status: DC

## 2010-10-24 MED ORDER — AMLODIPINE BESYLATE 5 MG PO TABS: 5.0000 mg | ORAL_TABLET | Freq: Every day | ORAL | Status: DC

## 2010-10-24 MED ORDER — HYDROCHLOROTHIAZIDE 25 MG PO TABS: 25.0000 mg | ORAL_TABLET | Freq: Every day | ORAL | Status: DC

## 2010-12-01 ENCOUNTER — Ambulatory Visit (INDEPENDENT_AMBULATORY_CARE_PROVIDER_SITE_OTHER): Payer: Medicare Other | Admitting: Cardiology

## 2010-12-01 ENCOUNTER — Encounter: Payer: Self-pay | Admitting: Cardiology

## 2010-12-01 VITALS — BP 129/76 | HR 64 | Resp 18 | Wt 228.1 lb

## 2010-12-01 DIAGNOSIS — E785 Hyperlipidemia, unspecified: Secondary | ICD-10-CM

## 2010-12-01 DIAGNOSIS — F528 Other sexual dysfunction not due to a substance or known physiological condition: Secondary | ICD-10-CM

## 2010-12-01 DIAGNOSIS — I259 Chronic ischemic heart disease, unspecified: Secondary | ICD-10-CM

## 2010-12-01 DIAGNOSIS — J329 Chronic sinusitis, unspecified: Secondary | ICD-10-CM | POA: Insufficient documentation

## 2010-12-01 MED ORDER — VARDENAFIL HCL 10 MG PO TABS: ORAL_TABLET | ORAL | Status: DC

## 2010-12-01 NOTE — Assessment & Plan Note (Signed)
The patient is wondering if this could be related to losartan. I think this is highly unlikely. I recommended for him to take Zyrtec 10 mg by mouth daily. 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

## 2010-12-01 NOTE — Progress Notes (Signed)
HPI The patient is a 77 year old male who drug-eluting stent placement in 2005 followed by diagnostic catheterization 2006 showing nonobstructive coronary artery disease. The patient continues to do well. He reports no chest pain or shortness of breath. He has no orthopnea PND. He complains of some chronic sinus drainage and congestion. He is wondering if this is related to losartan. However reviewing history the patient states that for years he does have sinus problems several months of the year. He is compliant with aspirin, but is unable to statin therapy due to severe myalgias and muscle stiffness. From a cardiac standpoint the patient otherwise is doing well. He reports no other complications.  Allergies  Allergen Reactions  . Ezetimibe   . Statins     Current Outpatient Prescriptions on File Prior to Visit  Medication Sig Dispense Refill  . amLODipine (NORVASC) 5 MG tablet Take 1 tablet (5 mg total) by mouth daily.  30 tablet  6  . aspirin 81 MG EC tablet Take 81 mg by mouth daily.        Marland Kitchen esomeprazole (NEXIUM) 40 MG capsule Take 40 mg by mouth daily.        . hydrochlorothiazide 25 MG tablet Take 1 tablet (25 mg total) by mouth daily.  30 tablet  6  . levothyroxine (SYNTHROID, LEVOTHROID) 75 MCG tablet Take 75 mcg by mouth daily.        Marland Kitchen losartan (COZAAR) 50 MG tablet Take 1 tablet (50 mg total) by mouth daily.  30 tablet  6  . pioglitazone (ACTOS) 45 MG tablet Take 45 mg by mouth daily.          Past Medical History  Diagnosis Date  . CAD (coronary artery disease)     Pt is stable, he does not need any testing at this time. hx of drug-eluting stent to the OM and right coronary in 2005 with followup cath in 2006 w/omly minimal disease and no significant stenoses.   . Dyslipidemia     intolerant to statins and Zetia and red yeast rice. I have recommended fish oil to him.  . Diabetes insipidus     type not specified  . Sinus bradycardia     asymptomatic and therefore is off  beta-blocker therapy  . Hypothyroidism     treated by Dr. Virgina Organ  . HTN (hypertension)     blood pressure is nicely controlled  . ED (erectile dysfunction)     Hx     No past surgical history on file.  No family history on file.  History   Social History  . Marital Status: Single    Spouse Name: N/A    Number of Children: N/A  . Years of Education: N/A   Occupational History  . Not on file.   Social History Main Topics  . Smoking status: Smoker, Current Status Unknown  . Smokeless tobacco: Not on file  . Alcohol Use: Not on file  . Drug Use: Not on file  . Sexually Active: Not on file   Other Topics Concern  . Not on file   Social History Narrative  . No narrative on file    WUX:[id number] positives as outlined above. The remainder of the 18  point review of systems is negative  PHYSICAL EXAM BP 129/76  Pulse 64  Resp 18  Wt 228 lb 1.9 oz (103.475 kg)  SpO2 95%  General: Well-developed, well-nourished in no distress Head: Normocephalic and atraumatic Eyes:PERRLA/EOMI intact, conjunctiva and lids normal Ears: No  deformity or lesions Mouth:normal dentition, normal posterior pharynx Neck: Supple, no JVD.  No masses, thyromegaly or abnormal cervical nodes Lungs: Normal breath sounds bilaterally without wheezing.  Normal percussion Cardiac: regular rate and rhythm with normal S1 and S2, no S3 or S4.  PMI is normal.  Soft systolic murmur 2/6 at the right upper sternal border. Abdomen: Normal bowel sounds, abdomen is soft and nontender without masses, organomegaly or hernias noted.  No hepatosplenomegaly MSK: Back normal, normal gait muscle strength and tone normal Vascular: Pulse is normal in all 4 extremities Extremities: No peripheral pitting edema Neurologic: Alert and oriented x 3 Skin: Intact without lesions or rashes Lymphatics: No significant adenopathy Psychologic: Normal affect   ECG: Not available  ASSESSMENT AND PLAN

## 2010-12-01 NOTE — Assessment & Plan Note (Signed)
I refilled the patient's prescription but he wanted Levitra instead of Cialis. Recommendation was to start at 5 mg initially given his age greater than 77 years of age

## 2010-12-01 NOTE — Patient Instructions (Signed)
   Levitra - use as needed  Your physician wants you to follow up in:  1 year.  You will receive a reminder letter in the mail one-two months in advance.  If you don't receive a letter, please call our office to schedule the follow up appointment

## 2010-12-01 NOTE — Assessment & Plan Note (Signed)
Laboratory work followed by the patient's primary care physician.

## 2010-12-01 NOTE — Assessment & Plan Note (Signed)
No recurrent chest pain. The patient has good exercise tolerance. 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 Continue current medical therapy with aspirin and beta blocker therapy

## 2011-06-29 ENCOUNTER — Other Ambulatory Visit: Payer: Self-pay | Admitting: *Deleted

## 2011-06-29 MED ORDER — HYDROCHLOROTHIAZIDE 25 MG PO TABS: 25.0000 mg | ORAL_TABLET | Freq: Every day | ORAL | Status: DC

## 2011-06-29 MED ORDER — AMLODIPINE BESYLATE 5 MG PO TABS: 5.0000 mg | ORAL_TABLET | Freq: Every day | ORAL | Status: DC

## 2011-07-27 DIAGNOSIS — R079 Chest pain, unspecified: Secondary | ICD-10-CM

## 2011-09-28 DIAGNOSIS — R0602 Shortness of breath: Secondary | ICD-10-CM

## 2011-12-01 ENCOUNTER — Ambulatory Visit: Payer: Medicare Other | Admitting: Cardiology

## 2012-01-15 ENCOUNTER — Telehealth: Payer: Self-pay | Admitting: Cardiology

## 2012-01-15 NOTE — Telephone Encounter (Signed)
Left message for patient; advised of openings today and future to reschedule appt from 11-2011 for Dr. Andee Lineman.

## 2012-01-27 ENCOUNTER — Other Ambulatory Visit: Payer: Self-pay | Admitting: *Deleted

## 2012-01-27 MED ORDER — AMLODIPINE BESYLATE 5 MG PO TABS: 5.0000 mg | ORAL_TABLET | Freq: Every day | ORAL | Status: DC

## 2012-01-27 MED ORDER — HYDROCHLOROTHIAZIDE 25 MG PO TABS: 25.0000 mg | ORAL_TABLET | Freq: Every day | ORAL | Status: DC

## 2012-02-15 ENCOUNTER — Other Ambulatory Visit: Payer: Self-pay | Admitting: Cardiology

## 2012-02-15 MED ORDER — LOSARTAN POTASSIUM 50 MG PO TABS: 50.0000 mg | ORAL_TABLET | Freq: Every day | ORAL | Status: DC

## 2012-03-15 ENCOUNTER — Ambulatory Visit (INDEPENDENT_AMBULATORY_CARE_PROVIDER_SITE_OTHER): Payer: Medicare Other | Admitting: Cardiology

## 2012-03-15 ENCOUNTER — Encounter: Payer: Self-pay | Admitting: Cardiology

## 2012-03-15 VITALS — BP 158/87 | HR 64 | Ht 71.0 in | Wt 229.1 lb

## 2012-03-15 DIAGNOSIS — I10 Essential (primary) hypertension: Secondary | ICD-10-CM

## 2012-03-15 DIAGNOSIS — E785 Hyperlipidemia, unspecified: Secondary | ICD-10-CM

## 2012-03-15 DIAGNOSIS — I2510 Atherosclerotic heart disease of native coronary artery without angina pectoris: Secondary | ICD-10-CM

## 2012-03-15 NOTE — Assessment & Plan Note (Signed)
Has had several medication intolerances, lipids followed by Dr. Leandrew Koyanagi. Discussed diet and basic walking regimen.

## 2012-03-15 NOTE — Assessment & Plan Note (Signed)
Blood pressure elevated today. He reports medication compliance, took his doses this morning. I asked them to keep a close eye on this and followup with Dr. Leandrew Koyanagi as he may need further medication up titration.

## 2012-03-15 NOTE — Patient Instructions (Addendum)

## 2012-03-15 NOTE — Progress Notes (Signed)
Clinical Summary Adam Peck is a 78 y.o.male presenting to the office for followup. He is a former patient of Dr. Andee Lineman, prefers to stay with [facility]. He was last seen in July 2012. He reports no active angina symptoms, no nitroglycerin use.  States he had a tick bite and has felt weak and has intermittent headaches, treated with antibiotics for possible Lyme disease per Dr. Leandrew Koyanagi based on his description.  He has history of statin and other lipid lowering medication intolerances. Lipids have been followed by Dr. Leandrew Koyanagi.  ECG reviewed and stable. He has generally preferred observation in terms of his CAD over time, no recent ischemic testing.   Allergies  Allergen Reactions  . Ezetimibe   . Statins     Current Outpatient Prescriptions  Medication Sig Dispense Refill  . amLODipine (NORVASC) 5 MG tablet Take 1 tablet (5 mg total) by mouth daily.  30 tablet  0  . aspirin 81 MG EC tablet Take 81 mg by mouth daily.        Marland Kitchen esomeprazole (NEXIUM) 40 MG capsule Take 40 mg by mouth daily.        . hydrochlorothiazide (HYDRODIURIL) 25 MG tablet Take 1 tablet (25 mg total) by mouth daily.  30 tablet  1  . levothyroxine (SYNTHROID, LEVOTHROID) 75 MCG tablet Take 75 mcg by mouth daily.        Marland Kitchen losartan (COZAAR) 50 MG tablet Take 1 tablet (50 mg total) by mouth daily.  30 tablet  1  . metFORMIN (GLUCOPHAGE) 500 MG tablet Take 500 mg by mouth 2 (two) times daily.      . Tamsulosin HCl (FLOMAX) 0.4 MG CAPS Take 0.4 mg by mouth daily.      . nitroGLYCERIN (NITROSTAT) 0.4 MG SL tablet Place 0.4 mg under the tongue every 5 (five) minutes as needed.        Past Medical History  Diagnosis Date  . Coronary atherosclerosis of native coronary artery     DES to the OM and RCA 2005  . Dyslipidemia     Intolerant to statins, Zetia, red yeast rice  . Type 2 diabetes mellitus   . Sinus bradycardia   . Hypothyroidism   . Essential hypertension, benign   . ED (erectile dysfunction)     Social  History Mr. Felkins reports that he quit smoking about 43 years ago. His smoking use included Cigarettes. He does not have any smokeless tobacco history on file. Mr. Devereux reports that he does not drink alcohol.  Review of Systems No palpitations or syncope. No orthopnea or PND. Feels fatigued. No fevers or chills. Otherwise negative.  Physical Examination Filed Vitals:   03/15/12 1500  BP: 158/87  Pulse: 64   Filed Weights   03/15/12 1500  Weight: 229 lb 1.9 oz (103.928 kg)   No acute distress. HEENT: Conjunctiva and lids normal, oropharynx clear. Neck: Supple, no elevated JVP or carotid bruits, no thyromegaly. Lungs: Clear to auscultation, diminished, nonlabored breathing at rest. Cardiac: Regular rate and rhythm, no S3, soft systolic murmur, no pericardial rub. Abdomen: Soft, nontender, bowel sounds present. Extremities: Trace edema, distal pulses 2+. Skin: Warm and dry. Musculoskeletal: No kyphosis. Neuropsychiatric: Alert and oriented x3, affect grossly appropriate.   Problem List and Plan   Coronary atherosclerosis of native coronary artery Symptomatically stable on medical therapy. Has not needed any nitroglycerin, and ECG is stable. He prefers conservative observation at this time. No changes made to his medical regimen.  DYSLIPIDEMIA Has had  several medication intolerances, lipids followed by Dr. Leandrew Koyanagi. Discussed diet and basic walking regimen.  ESSENTIAL HYPERTENSION, BENIGN Blood pressure elevated today. He reports medication compliance, took his doses this morning. I asked them to keep a close eye on this and followup with Dr. Leandrew Koyanagi as he may need further medication up titration.    Jonelle Sidle, M.D., F.A.C.C.

## 2012-03-15 NOTE — Assessment & Plan Note (Signed)
Symptomatically stable on medical therapy. Has not needed any nitroglycerin, and ECG is stable. He prefers conservative observation at this time. No changes made to his medical regimen.

## 2012-03-25 ENCOUNTER — Other Ambulatory Visit: Payer: Self-pay | Admitting: Cardiology

## 2012-03-25 MED ORDER — AMLODIPINE BESYLATE 5 MG PO TABS: 5.0000 mg | ORAL_TABLET | Freq: Every day | ORAL | Status: DC

## 2012-06-11 DIAGNOSIS — W57XXXA Bitten or stung by nonvenomous insect and other nonvenomous arthropods, initial encounter: Secondary | ICD-10-CM

## 2012-06-11 HISTORY — DX: Bitten or stung by nonvenomous insect and other nonvenomous arthropods, initial encounter: W57.XXXA

## 2012-07-26 ENCOUNTER — Encounter: Payer: Self-pay | Admitting: Cardiology

## 2012-08-11 ENCOUNTER — Encounter: Payer: Self-pay | Admitting: Physician Assistant

## 2012-08-11 ENCOUNTER — Encounter: Payer: Self-pay | Admitting: Cardiology

## 2012-08-11 ENCOUNTER — Ambulatory Visit (INDEPENDENT_AMBULATORY_CARE_PROVIDER_SITE_OTHER): Payer: Medicare Other | Admitting: Cardiology

## 2012-08-11 ENCOUNTER — Other Ambulatory Visit: Payer: Self-pay | Admitting: Cardiology

## 2012-08-11 ENCOUNTER — Encounter: Payer: Self-pay | Admitting: *Deleted

## 2012-08-11 ENCOUNTER — Telehealth: Payer: Self-pay | Admitting: Cardiology

## 2012-08-11 VITALS — BP 130/77 | HR 60 | Ht 71.0 in | Wt 226.0 lb

## 2012-08-11 DIAGNOSIS — Z01810 Encounter for preprocedural cardiovascular examination: Secondary | ICD-10-CM

## 2012-08-11 DIAGNOSIS — R9439 Abnormal result of other cardiovascular function study: Secondary | ICD-10-CM

## 2012-08-11 DIAGNOSIS — N182 Chronic kidney disease, stage 2 (mild): Secondary | ICD-10-CM | POA: Insufficient documentation

## 2012-08-11 DIAGNOSIS — I2510 Atherosclerotic heart disease of native coronary artery without angina pectoris: Secondary | ICD-10-CM

## 2012-08-11 DIAGNOSIS — R0602 Shortness of breath: Secondary | ICD-10-CM

## 2012-08-11 LAB — PROTIME-INR

## 2012-08-11 NOTE — Patient Instructions (Signed)
Your physician has requested that you have a cardiac catheterization on Tuesday, August 16, 2012 @10 :00 am. Cardiac catheterization is used to diagnose and/or treat various heart conditions. Doctors may recommend this procedure for a number of different reasons. The most common reason is to evaluate chest pain. Chest pain can be a symptom of coronary artery disease (CAD), and cardiac catheterization can show whether plaque is narrowing or blocking your heart's arteries. This procedure is also used to evaluate the valves, as well as measure the blood flow and oxygen levels in different parts of your heart. For further information please visit [email or web address] Please follow instruction sheet, as given. Your physician recommends that you return for lab work in: today at [facility] for [facility]. A chest x-ray takes a picture of the organs and structures inside the chest, including the heart, lungs, and blood vessels. This test can show several things, including, whether the heart is enlarges; whether fluid is building up in the lungs; and whether pacemaker / defibrillator leads are still in place.

## 2012-08-11 NOTE — Telephone Encounter (Signed)
Left heart cath JV lab Tuesday, 08/16/12 @10 :00 am Dr. [place] f/u after cath

## 2012-08-11 NOTE — Progress Notes (Signed)
 Clinical Summary Adam Peck is a 79 y.o.male last seen in November 2013. He is referred back to the clinic by Dr. Burdine. Adam Peck reports a feeling of progressive weakness, also intermittent shortness of breath. No clear-cut anginal chest pain. He states he was concerned he might have Lyme disease after a tick bite, although he was treated with antibiotics by Dr. Burdine.  He was referred for an exercise Cardiolite by Dr. Burdine recently. The study demonstrated no acute ST segment abnormalities, PACs and PVCs, and mid to basal inferior ischemia with LVEF 56%.   He previously saw Dr. DeGent, has a known history of CAD status post DES to the OM and RCA 2005. Previous lab work did show creatinine 1.4, GFR 50. This was in 2008.   Allergies  Allergen Reactions  . Ezetimibe   . Statins     Current Outpatient Prescriptions  Medication Sig Dispense Refill  . amLODipine (NORVASC) 5 MG tablet Take 1 tablet (5 mg total) by mouth daily.  30 tablet  9  . aspirin 81 MG EC tablet Take 81 mg by mouth daily.        . ergocalciferol (VITAMIN D2) 50000 UNITS capsule Take 50,000 Units by mouth once a week.      . esomeprazole (NEXIUM) 40 MG capsule Take 40 mg by mouth daily.        . levothyroxine (SYNTHROID, LEVOTHROID) 75 MCG tablet Take 75 mcg by mouth daily.        . losartan (COZAAR) 50 MG tablet Take 1 tablet (50 mg total) by mouth daily.  30 tablet  1  . metFORMIN (GLUCOPHAGE) 500 MG tablet Take 500 mg by mouth 2 (two) times daily.      . nitroGLYCERIN (NITROSTAT) 0.4 MG SL tablet Place 0.4 mg under the tongue every 5 (five) minutes as needed.      . Tamsulosin HCl (FLOMAX) 0.4 MG CAPS Take 0.4 mg by mouth daily.       No current facility-administered medications for this visit.    Past Medical History  Diagnosis Date  . Coronary atherosclerosis of native coronary artery     DES to the OM and RCA 2005  . Dyslipidemia     Intolerant to statins, Zetia, red yeast rice  . Type 2 diabetes  mellitus   . Sinus bradycardia   . Hypothyroidism   . Essential hypertension, benign   . ED (erectile dysfunction)     Social History Adam Peck reports that he quit smoking about 44 years ago. His smoking use included Cigarettes. He smoked 0.00 packs per day. He quit smokeless tobacco use about 34 years ago. His smokeless tobacco use included Chew. Adam Peck reports that he does not drink alcohol.  Review of Systems No fevers or chills. Appetite is stable. Has not used any nitroglycerin recently. No syncope. No orthopnea or PND.  Physical Examination Filed Vitals:   08/11/12 1516  BP: 130/77  Pulse: 60   Filed Weights   08/11/12 1516  Weight: 226 lb (102.513 kg)    No acute distress.  HEENT: Conjunctiva and lids normal, oropharynx clear.  Neck: Supple, no elevated JVP or carotid bruits, no thyromegaly.  Lungs: Clear to auscultation, diminished, nonlabored breathing at rest.  Cardiac: Regular rate and rhythm, no S3, 2/6 systolic murmur, no pericardial rub.  Abdomen: Soft, nontender, bowel sounds present.  Extremities: Trace edema, distal pulses 2+.  Skin: Warm and dry.  Musculoskeletal: No kyphosis.  Neuropsychiatric: Alert and oriented x3,   affect grossly appropriate.   Problem List and Plan   Abnormal myocardial perfusion study Study indicative of inferior ischemia. Patient has a history of DES to the OM and RCA 2005. He has been experiencing weakness intermittent shortness of breath, no definite anginal chest pain. Plan will be diagnostic cardiac catheterization to reassess coronary anatomy, exclude any in-stent restenosis or other de novo stenoses that might require revascularization.  Coronary atherosclerosis of native coronary artery Status post DES to the OM and RCA in 2005.  CKD (chronic kidney disease) stage 2, GFR 60-89 ml/min Will reassess lab work prior cardiac catheterization.  ESSENTIAL HYPERTENSION, BENIGN No change to current  regimen.  DYSLIPIDEMIA Statin intolerance.    Adam Brinton G. Adam Peck, M.D., F.A.C.C.   

## 2012-08-11 NOTE — Assessment & Plan Note (Signed)
Will reassess lab work prior cardiac catheterization.

## 2012-08-11 NOTE — Assessment & Plan Note (Signed)
Study indicative of inferior ischemia. Patient has a history of DES to the OM and RCA 2005. He has been experiencing weakness intermittent shortness of breath, no definite anginal chest pain. Plan will be diagnostic cardiac catheterization to reassess coronary anatomy, exclude any in-stent restenosis or other de novo stenoses that might require revascularization.

## 2012-08-11 NOTE — Assessment & Plan Note (Signed)
No change to current regimen. 

## 2012-08-11 NOTE — Assessment & Plan Note (Signed)
Status post DES to the OM and RCA in 2005.

## 2012-08-11 NOTE — Assessment & Plan Note (Signed)
Statin intolerance 

## 2012-08-16 ENCOUNTER — Encounter (HOSPITAL_BASED_OUTPATIENT_CLINIC_OR_DEPARTMENT_OTHER)
Admission: RE | Disposition: A | Discharge status: Home / Self Care | Payer: Self-pay | Source: Ambulatory Visit | Attending: Cardiology

## 2012-08-16 ENCOUNTER — Inpatient Hospital Stay (HOSPITAL_BASED_OUTPATIENT_CLINIC_OR_DEPARTMENT_OTHER)
Admission: RE | Admit: 2012-08-16 | Discharge: 2012-08-16 | Disposition: A | Discharge status: Home / Self Care | Payer: Medicare Other | Source: Ambulatory Visit | Attending: Cardiology | Admitting: Cardiology

## 2012-08-16 DIAGNOSIS — I129 Hypertensive chronic kidney disease with stage 1 through stage 4 chronic kidney disease, or unspecified chronic kidney disease: Secondary | ICD-10-CM | POA: Insufficient documentation

## 2012-08-16 DIAGNOSIS — I2510 Atherosclerotic heart disease of native coronary artery without angina pectoris: Secondary | ICD-10-CM | POA: Insufficient documentation

## 2012-08-16 DIAGNOSIS — E785 Hyperlipidemia, unspecified: Secondary | ICD-10-CM | POA: Insufficient documentation

## 2012-08-16 DIAGNOSIS — R9439 Abnormal result of other cardiovascular function study: Secondary | ICD-10-CM | POA: Insufficient documentation

## 2012-08-16 DIAGNOSIS — N182 Chronic kidney disease, stage 2 (mild): Secondary | ICD-10-CM | POA: Insufficient documentation

## 2012-08-16 DIAGNOSIS — R0602 Shortness of breath: Secondary | ICD-10-CM | POA: Insufficient documentation

## 2012-08-16 DIAGNOSIS — Z9861 Coronary angioplasty status: Secondary | ICD-10-CM | POA: Insufficient documentation

## 2012-08-16 SURGERY — JV LEFT HEART CATHETERIZATION WITH CORONARY ANGIOGRAM
Anesthesia: Moderate Sedation

## 2012-08-16 MED ORDER — METFORMIN HCL 500 MG PO TABS: 500.0000 mg | ORAL_TABLET | Freq: Two times a day (BID) | ORAL | Status: DC

## 2012-08-16 MED ORDER — SODIUM CHLORIDE 0.9 % IV SOLN: 1.0000 mL/kg/h | INTRAVENOUS | Status: DC

## 2012-08-16 MED ORDER — ACETAMINOPHEN 325 MG PO TABS: 650.0000 mg | ORAL_TABLET | ORAL | Status: DC | PRN

## 2012-08-16 MED ORDER — SODIUM CHLORIDE 0.9 % IV SOLN: INTRAVENOUS | Status: DC

## 2012-08-16 MED ORDER — ASPIRIN 81 MG PO CHEW: 324.0000 mg | CHEWABLE_TABLET | ORAL | Status: DC

## 2012-08-16 MED ORDER — ONDANSETRON HCL 4 MG/2ML IJ SOLN: 4.0000 mg | Freq: Four times a day (QID) | INTRAMUSCULAR | Status: DC | PRN

## 2012-08-16 NOTE — Progress Notes (Signed)
Dr P [place] in to talk to pt and family about the results of test and plan of care

## 2012-08-16 NOTE — OR Nursing (Signed)
+  Allen's test right hand 

## 2012-08-16 NOTE — H&P (View-Only) (Signed)
Clinical Summary Mr. Chalmers is a 79 y.o.male last seen in November 2013. He is referred back to the clinic by Dr. Leandrew Koyanagi. Mr. Mallek reports a feeling of progressive weakness, also intermittent shortness of breath. No clear-cut anginal chest pain. He states he was concerned he might have Lyme disease after a tick bite, although he was treated with antibiotics by Dr. Leandrew Koyanagi.  He was referred for an exercise Cardiolite by Dr. Leandrew Koyanagi recently. The study demonstrated no acute ST segment abnormalities, PACs and PVCs, and mid to basal inferior ischemia with LVEF 56%.   He previously saw Dr. Andee Lineman, has a known history of CAD status post DES to the OM and RCA 2005. Previous lab work did show creatinine 1.4, GFR 50. This was in 2008.   Allergies  Allergen Reactions  . Ezetimibe   . Statins     Current Outpatient Prescriptions  Medication Sig Dispense Refill  . amLODipine (NORVASC) 5 MG tablet Take 1 tablet (5 mg total) by mouth daily.  30 tablet  9  . aspirin 81 MG EC tablet Take 81 mg by mouth daily.        . ergocalciferol (VITAMIN D2) 50000 UNITS capsule Take 50,000 Units by mouth once a week.      . esomeprazole (NEXIUM) 40 MG capsule Take 40 mg by mouth daily.        Marland Kitchen levothyroxine (SYNTHROID, LEVOTHROID) 75 MCG tablet Take 75 mcg by mouth daily.        Marland Kitchen losartan (COZAAR) 50 MG tablet Take 1 tablet (50 mg total) by mouth daily.  30 tablet  1  . metFORMIN (GLUCOPHAGE) 500 MG tablet Take 500 mg by mouth 2 (two) times daily.      . nitroGLYCERIN (NITROSTAT) 0.4 MG SL tablet Place 0.4 mg under the tongue every 5 (five) minutes as needed.      . Tamsulosin HCl (FLOMAX) 0.4 MG CAPS Take 0.4 mg by mouth daily.       No current facility-administered medications for this visit.    Past Medical History  Diagnosis Date  . Coronary atherosclerosis of native coronary artery     DES to the OM and RCA 2005  . Dyslipidemia     Intolerant to statins, Zetia, red yeast rice  . Type 2 diabetes  mellitus   . Sinus bradycardia   . Hypothyroidism   . Essential hypertension, benign   . ED (erectile dysfunction)     Social History Mr. Winegar reports that he quit smoking about 44 years ago. His smoking use included Cigarettes. He smoked 0.00 packs per day. He quit smokeless tobacco use about 34 years ago. His smokeless tobacco use included Chew. Mr. Fero reports that he does not drink alcohol.  Review of Systems No fevers or chills. Appetite is stable. Has not used any nitroglycerin recently. No syncope. No orthopnea or PND.  Physical Examination Filed Vitals:   08/11/12 1516  BP: 130/77  Pulse: 60   Filed Weights   08/11/12 1516  Weight: 226 lb (102.513 kg)    No acute distress.  HEENT: Conjunctiva and lids normal, oropharynx clear.  Neck: Supple, no elevated JVP or carotid bruits, no thyromegaly.  Lungs: Clear to auscultation, diminished, nonlabored breathing at rest.  Cardiac: Regular rate and rhythm, no S3, 2/6 systolic murmur, no pericardial rub.  Abdomen: Soft, nontender, bowel sounds present.  Extremities: Trace edema, distal pulses 2+.  Skin: Warm and dry.  Musculoskeletal: No kyphosis.  Neuropsychiatric: Alert and oriented x3,  affect grossly appropriate.   Problem List and Plan   Abnormal myocardial perfusion study Study indicative of inferior ischemia. Patient has a history of DES to the OM and RCA 2005. He has been experiencing weakness intermittent shortness of breath, no definite anginal chest pain. Plan will be diagnostic cardiac catheterization to reassess coronary anatomy, exclude any in-stent restenosis or other de novo stenoses that might require revascularization.  Coronary atherosclerosis of native coronary artery Status post DES to the OM and RCA in 2005.  CKD (chronic kidney disease) stage 2, GFR 60-89 ml/min Will reassess lab work prior cardiac catheterization.  ESSENTIAL HYPERTENSION, BENIGN No change to current  regimen.  DYSLIPIDEMIA Statin intolerance.    Jonelle Sidle, M.D., F.A.C.C.

## 2012-08-16 NOTE — CV Procedure (Signed)
   Cardiac Catheterization Procedure Note  Name: Adam Peck MRN: [id number] DOB: [birth year]  Procedure: Left Heart Cath, Selective Coronary Angiography, LV angiography  Indication: 79 year-old white male who presents with symptoms of increased fatigue, weakness, and dyspnea on exertion. He has a history remote stenting of the first obtuse marginal vessel and mid RCA in 2005. Recent stress Myoview study showed very poor exercise tolerance. He had a moderate inferior defect consistent with ischemia.   Procedural Details: The right wrist was prepped, draped, and anesthetized with 1% lidocaine. Using the modified Seldinger technique, a 5 French sheath was introduced into the right radial artery. 3 mg of verapamil was administered through the sheath, weight-based unfractionated heparin was administered intravenously. Standard Judkins catheters were used for selective coronary angiography and left ventriculography. Catheter exchanges were performed over an exchange length guidewire. There were no immediate procedural complications. A TR band was used for radial hemostasis at the completion of the procedure.  The patient was transferred to the post catheterization recovery area for further monitoring.  Procedural Findings: Hemodynamics: AO 147/69 with a mean of 104 mmHg LV 148/30 mmHg  Coronary angiography: Coronary dominance: right  Left mainstem:  There is mild narrowing at the distal left main less than 20%.  Left anterior descending (LAD):  the left anterior descending artery is moderately calcified throughout the proximal and mid vessel. In the mid LAD immediately after the takeoff of the first diagonal there is a 90% stenosis. This is a fairly long segment of disease. The first septal perforator also has a 90% stenosis at its origin.  Left circumflex (LCx):  the left circumflex gives rise to 3 marginal branches. The first marginal branch has a stent in the proximal vessel that is widely  patent. In the mid left circumflex there is a 90% focal stenosis prior to the takeoff of the second marginal branch.  Right coronary artery (RCA):  the right coronary is occluded proximally. There are mild left to right and right to right collaterals.  Left ventriculography: Left ventricular systolic function is normal, LVEF is estimated at 55-65%, there is no significant mitral regurgitation   Final Conclusions:   1. Severe three-vessel obstructive coronary disease. 2. Normal LV function.  Recommendations:  This patient has severe progression of disease compared to 2005. I would recommend coronary bypass surgery given his symptoms and poor results on stress testing.  Theron Arista [facility] 08/16/2012, 10:29 AM

## 2012-08-16 NOTE — Interval H&P Note (Signed)
History and Physical Interval Note:  08/16/2012 9:54 AM  Adam Peck  has presented today for surgery, with the diagnosis of abn cardiolite  The various methods of treatment have been discussed with the patient and family. After consideration of risks, benefits and other options for treatment, the patient has consented to  Procedure(s): JV LEFT HEART CATHETERIZATION WITH CORONARY ANGIOGRAM (N/A) as a surgical intervention .  The patient's history has been reviewed, patient examined, no change in status, stable for surgery.  I have reviewed the patient's chart and labs.  Questions were answered to the patient's satisfaction.     Theron Arista [facility] 08/16/2012 9:54 AM

## 2012-08-16 NOTE — OR Nursing (Signed)
TR band removed, pressure dressing and wrist splint applied, distal circulation intact, discharge instructions reviewed and signed, pt stated understanding, ambulate in hall without difficulty, transported to friend's car via wheelchair

## 2012-08-18 LAB — POCT I-STAT GLUCOSE: Operator id: 141321

## 2012-08-22 ENCOUNTER — Other Ambulatory Visit: Payer: Self-pay | Admitting: *Deleted

## 2012-08-22 ENCOUNTER — Other Ambulatory Visit: Payer: Self-pay

## 2012-08-22 ENCOUNTER — Encounter: Payer: Medicare Other | Admitting: Cardiothoracic Surgery

## 2012-08-22 DIAGNOSIS — I2510 Atherosclerotic heart disease of native coronary artery without angina pectoris: Secondary | ICD-10-CM

## 2012-08-22 NOTE — Telephone Encounter (Signed)
DONE

## 2012-08-25 ENCOUNTER — Other Ambulatory Visit (INDEPENDENT_AMBULATORY_CARE_PROVIDER_SITE_OTHER): Payer: Medicare Other

## 2012-08-25 ENCOUNTER — Other Ambulatory Visit: Payer: Self-pay | Admitting: *Deleted

## 2012-08-25 ENCOUNTER — Institutional Professional Consult (permissible substitution) (INDEPENDENT_AMBULATORY_CARE_PROVIDER_SITE_OTHER): Payer: Medicare Other | Admitting: Cardiothoracic Surgery

## 2012-08-25 ENCOUNTER — Other Ambulatory Visit: Payer: Self-pay

## 2012-08-25 ENCOUNTER — Encounter: Payer: Self-pay | Admitting: Cardiothoracic Surgery

## 2012-08-25 VITALS — BP 156/90 | HR 67 | Resp 20 | Ht 71.0 in | Wt 226.0 lb

## 2012-08-25 DIAGNOSIS — I2510 Atherosclerotic heart disease of native coronary artery without angina pectoris: Secondary | ICD-10-CM

## 2012-08-25 DIAGNOSIS — E119 Type 2 diabetes mellitus without complications: Secondary | ICD-10-CM | POA: Insufficient documentation

## 2012-08-25 DIAGNOSIS — T82897A Other specified complication of cardiac prosthetic devices, implants and grafts, initial encounter: Secondary | ICD-10-CM | POA: Insufficient documentation

## 2012-08-25 NOTE — Progress Notes (Signed)
PCP is Juliette Alcide, MD Referring Provider is [place], Peter M, MD  Chief Complaint  Patient presents with  . 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  In 2007 the patient had a PC I.-stent to the RCA and ramus.   A myocardial perfusion test showed ischemic changes. The patient underwent coronary arteriography by Dr. Swaziland which demonstrated chronic occlusion of the RCA, high-grade stenosis of the LAD, high-grade stenosis of the circumflex and circumflex marginal with EF of 55%.  2-D echocardiogram performed today demonstrates no significant valvular disease, moderate LVH with diastolic dysfunction, no pericardial effusion, EF 55%.   The patient denies any resting symptoms other than some occasional orthopnea. Past Medical History  Diagnosis Date  . Coronary atherosclerosis of native coronary artery     DES to the OM and RCA 2005  . Dyslipidemia     Intolerant to statins, Zetia, red yeast rice  . Type 2 diabetes mellitus   . Sinus bradycardia   . Hypothyroidism   . Essential hypertension, benign   . ED (erectile dysfunction)     Past Surgical History  Procedure Laterality Date  . L4-5 laminectomy     status post left cervical laminectomy No family history on file. Family history positive for CHF, positive for diabetes in his parents the father had hypertension. Social History History  Substance Use Topics  . Smoking status: Former Smoker    Types: Cigarettes    Quit date: 05/11/1968  . Smokeless tobacco: Former Neurosurgeon    Types: Chew    Quit date: 05/11/1978  . Alcohol Use: No   The patient currently lives alone. He would like to go to the [place] [facility] for skilled nursing care  after discharge following CABG. Current Outpatient Prescriptions  Medication Sig Dispense Refill  . amLODipine (NORVASC) 5 MG tablet Take 1 tablet (5 mg total) by mouth daily.  30 tablet  9  . aspirin 81 MG EC tablet Take 81 mg by mouth daily.        . ergocalciferol (VITAMIN D2) 50000 UNITS capsule Take 50,000 Units by mouth once a week.      . esomeprazole (NEXIUM) 40 MG capsule Take 40 mg by mouth daily.        Marland Kitchen levothyroxine (SYNTHROID, LEVOTHROID) 75 MCG tablet Take 75 mcg by mouth daily.        Marland Kitchen losartan (COZAAR) 50 MG tablet Take 1 tablet (50 mg total) by mouth daily.  30 tablet  1  . nitroGLYCERIN (NITROSTAT) 0.4 MG SL tablet Place 0.4 mg under the tongue every 5 (five) minutes as needed.      . Tamsulosin HCl (FLOMAX) 0.4 MG CAPS Take 0.4 mg by mouth daily.       No current facility-administered medications for this visit.    Allergies  Allergen Reactions  . Ezetimibe   . Statins     Review of Systems No weight loss or fever No history of thoracic trauma or pneumothorax No previous thoracic surgery, status post previous inguinal hernia repair No anesthetic complications or bleeding disorders Mild neuropathy of left foot No history DVT or PE No history of mini stroke seizure Right-hand dominant BP 156/90  Pulse 67  Resp 20  Ht 5\' 11"  (1.803  m)  Wt 226 lb (102.513 kg)  BMI 31.53 kg/m2  SpO2 97% Physical Exam General appearance elderly Caucasian male no acute distress accompanied by his granddaughter HEENT normocephalic full upper plate partial lower plate Neck without bruit or JVD or mass Lymph nodes no palpable adenopathy the neck Cardiac regular rhythm without murmur or gallop Thorax clear breath sounds no thoracic deformity Abdomen soft nontender without pulsatile mass Extremities warm no clubbing cyanosis or edema Vascular 1+ pedal pulses Neuro no focal motor deficit   Diagnostic Tests: Cord tear grams reviewed, 2-D echocardiogram  reviewed Impression: Severe three-vessel disease, symptomatic class III class for  Plan: CABG scheduled for April 22 at [place]. Procedure indications benefits and risks discussed with patient.

## 2012-08-26 ENCOUNTER — Encounter (HOSPITAL_COMMUNITY): Payer: Self-pay | Admitting: Pharmacy Technician

## 2012-08-29 ENCOUNTER — Encounter (HOSPITAL_COMMUNITY)
Admission: RE | Admit: 2012-08-29 | Discharge: 2012-08-29 | Disposition: A | Discharge status: Home / Self Care | Payer: Medicare Other | Source: Ambulatory Visit | Attending: Cardiothoracic Surgery | Admitting: Cardiothoracic Surgery

## 2012-08-29 ENCOUNTER — Encounter (HOSPITAL_COMMUNITY): Payer: Self-pay

## 2012-08-29 ENCOUNTER — Encounter (HOSPITAL_COMMUNITY): Payer: Self-pay | Admitting: *Deleted

## 2012-08-29 ENCOUNTER — Inpatient Hospital Stay (HOSPITAL_COMMUNITY)
Admission: RE | Admit: 2012-08-29 | Discharge: 2012-08-29 | Disposition: A | Discharge status: Home / Self Care | Payer: Medicare Other | Source: Ambulatory Visit | Attending: Cardiothoracic Surgery | Admitting: Cardiothoracic Surgery

## 2012-08-29 ENCOUNTER — Encounter (HOSPITAL_COMMUNITY): Payer: Self-pay | Admitting: Radiology

## 2012-08-29 ENCOUNTER — Ambulatory Visit (HOSPITAL_COMMUNITY)
Admission: RE | Admit: 2012-08-29 | Discharge: 2012-08-29 | Disposition: A | Discharge status: Home / Self Care | Payer: Medicare Other | Source: Ambulatory Visit | Attending: Family Medicine | Admitting: Family Medicine

## 2012-08-29 ENCOUNTER — Telehealth: Payer: Self-pay | Admitting: *Deleted

## 2012-08-29 VITALS — BP 155/86 | HR 67 | Temp 97.7°F | Resp 20 | Ht 71.0 in | Wt 221.4 lb

## 2012-08-29 DIAGNOSIS — Z01810 Encounter for preprocedural cardiovascular examination: Secondary | ICD-10-CM

## 2012-08-29 DIAGNOSIS — I2510 Atherosclerotic heart disease of native coronary artery without angina pectoris: Secondary | ICD-10-CM

## 2012-08-29 HISTORY — DX: Gastro-esophageal reflux disease without esophagitis: K21.9

## 2012-08-29 HISTORY — DX: Headache: R51

## 2012-08-29 HISTORY — DX: Unspecified osteoarthritis, unspecified site: M19.90

## 2012-08-29 HISTORY — DX: Personal history of other diseases of the musculoskeletal system and connective tissue: Z87.39

## 2012-08-29 HISTORY — DX: Benign prostatic hyperplasia without lower urinary tract symptoms: N40.0

## 2012-08-29 HISTORY — DX: Bitten or stung by nonvenomous insect and other nonvenomous arthropods, initial encounter: W57.XXXA

## 2012-08-29 HISTORY — DX: Carpal tunnel syndrome, unspecified upper limb: G56.00

## 2012-08-29 HISTORY — DX: Anxiety disorder, unspecified: F41.9

## 2012-08-29 LAB — CBC
HCT: 44.4 % (ref 39.0–52.0)
Hemoglobin: 14.9 g/dL (ref 13.0–17.0)
MCH: 27.5 pg (ref 26.0–34.0)
MCHC: 33.6 g/dL (ref 30.0–36.0)
MCV: 82.1 fL (ref 78.0–100.0)
Platelets: 206 10*3/uL (ref 150–400)
RBC: 5.41 MIL/uL (ref 4.22–5.81)
RDW: 13.9 % (ref 11.5–15.5)
WBC: 8.2 10*3/uL (ref 4.0–10.5)

## 2012-08-29 LAB — BLOOD GAS, ARTERIAL
Acid-Base Excess: 0.9 mmol/L (ref 0.0–2.0)
Bicarbonate: 24.8 mEq/L — ABNORMAL HIGH (ref 20.0–24.0)
Drawn by: 344381
FIO2: 0.21 %
O2 Saturation: 94.6 %
Patient temperature: 98.6
TCO2: 26 mmol/L (ref 0–100)
pCO2 arterial: 38.9 mmHg (ref 35.0–45.0)
pH, Arterial: 7.421 (ref 7.350–7.450)
pO2, Arterial: 66 mmHg — ABNORMAL LOW (ref 80.0–100.0)

## 2012-08-29 LAB — ABO/RH: ABO/RH(D): A POS

## 2012-08-29 LAB — PULMONARY FUNCTION TEST

## 2012-08-29 LAB — COMPREHENSIVE METABOLIC PANEL
ALT: 24 U/L (ref 0–53)
AST: 29 U/L (ref 0–37)
Albumin: 3.9 g/dL (ref 3.5–5.2)
Alkaline Phosphatase: 58 U/L (ref 39–117)
BUN: 13 mg/dL (ref 6–23)
CO2: 22 mEq/L (ref 19–32)
Calcium: 9.6 mg/dL (ref 8.4–10.5)
Chloride: 104 mEq/L (ref 96–112)
Creatinine, Ser: 1.07 mg/dL (ref 0.50–1.35)
GFR calc Af Amer: 76 mL/min — ABNORMAL LOW (ref >90)
GFR calc non Af Amer: 65 mL/min — ABNORMAL LOW (ref >90)
Glucose, Bld: 138 mg/dL — ABNORMAL HIGH (ref 70–99)
Potassium: 4.1 mEq/L (ref 3.5–5.1)
Sodium: 138 mEq/L (ref 135–145)
Total Bilirubin: 0.7 mg/dL (ref 0.3–1.2)
Total Protein: 7.6 g/dL (ref 6.0–8.3)

## 2012-08-29 LAB — PROTIME-INR
INR: 0.98 (ref 0.00–1.49)
Prothrombin Time: 12.9 seconds (ref 11.6–15.2)

## 2012-08-29 LAB — SURGICAL PCR SCREEN
MRSA, PCR: NEGATIVE
Staphylococcus aureus: NEGATIVE

## 2012-08-29 LAB — HEMOGLOBIN A1C
Hgb A1c MFr Bld: 6.6 % — ABNORMAL HIGH (ref <5.7)
Mean Plasma Glucose: 143 mg/dL — ABNORMAL HIGH (ref <117)

## 2012-08-29 LAB — URINALYSIS, ROUTINE W REFLEX MICROSCOPIC
Bilirubin Urine: NEGATIVE
Glucose, UA: NEGATIVE mg/dL
Hgb urine dipstick: NEGATIVE
Ketones, ur: NEGATIVE mg/dL
Leukocytes, UA: NEGATIVE
Nitrite: NEGATIVE
Protein, ur: NEGATIVE mg/dL
Specific Gravity, Urine: 1.011 (ref 1.005–1.030)
Urobilinogen, UA: 0.2 mg/dL (ref 0.0–1.0)
pH: 6.5 (ref 5.0–8.0)

## 2012-08-29 LAB — APTT: aPTT: 36 seconds (ref 24–37)

## 2012-08-29 MED ORDER — CEFUROXIME SODIUM 1.5 G IJ SOLR
1.5000 g | INTRAMUSCULAR | Status: AC
  Administered 2012-08-30: 1.5 g via INTRAVENOUS
  Administered 2012-08-30: .75 g via INTRAVENOUS
  Filled 2012-08-29 (×2): qty 1.5

## 2012-08-29 MED ORDER — SODIUM CHLORIDE 0.9 % IV SOLN
INTRAVENOUS | Status: AC
  Administered 2012-08-30: 2.5 [IU]/h via INTRAVENOUS
  Filled 2012-08-29: qty 1

## 2012-08-29 MED ORDER — ALBUTEROL SULFATE (5 MG/ML) 0.5% IN NEBU
2.5000 mg | INHALATION_SOLUTION | Freq: Once | RESPIRATORY_TRACT | Status: AC
  Administered 2012-08-29: 2.5 mg via RESPIRATORY_TRACT

## 2012-08-29 MED ORDER — DEXTROSE 5 % IV SOLN
750.0000 mg | INTRAVENOUS | Status: DC
  Filled 2012-08-29: qty 750

## 2012-08-29 MED ORDER — PLASMA-LYTE 148 IV SOLN
INTRAVENOUS | Status: AC
  Administered 2012-08-30: 09:00:00
  Filled 2012-08-29: qty 2.5

## 2012-08-29 MED ORDER — CHLORHEXIDINE GLUCONATE 4 % EX LIQD: 30.0000 mL | CUTANEOUS | Status: DC

## 2012-08-29 MED ORDER — METOPROLOL TARTRATE 12.5 MG HALF TABLET
12.5000 mg | ORAL_TABLET | Freq: Once | ORAL | Status: AC
  Administered 2012-08-30: 12.5 mg via ORAL
  Filled 2012-08-29: qty 1

## 2012-08-29 MED ORDER — DOPAMINE-DEXTROSE 3.2-5 MG/ML-% IV SOLN
2.0000 ug/kg/min | INTRAVENOUS | Status: AC
  Administered 2012-08-30: 3 ug/kg/min via INTRAVENOUS
  Filled 2012-08-29: qty 250

## 2012-08-29 MED ORDER — EPINEPHRINE HCL 1 MG/ML IJ SOLN
0.5000 ug/min | INTRAVENOUS | Status: DC
  Filled 2012-08-29: qty 4

## 2012-08-29 MED ORDER — MAGNESIUM SULFATE 50 % IJ SOLN
40.0000 meq | INTRAMUSCULAR | Status: DC
  Filled 2012-08-29: qty 10

## 2012-08-29 MED ORDER — PHENYLEPHRINE HCL 10 MG/ML IJ SOLN
30.0000 ug/min | INTRAVENOUS | Status: DC
  Filled 2012-08-29: qty 2

## 2012-08-29 MED ORDER — AMINOCAPROIC ACID 250 MG/ML IV SOLN
INTRAVENOUS | Status: AC
  Administered 2012-08-30: 70 mL/h via INTRAVENOUS
  Filled 2012-08-29: qty 40

## 2012-08-29 MED ORDER — DEXMEDETOMIDINE HCL IN NACL 400 MCG/100ML IV SOLN
0.1000 ug/kg/h | INTRAVENOUS | Status: DC
  Filled 2012-08-29: qty 100

## 2012-08-29 MED ORDER — POTASSIUM CHLORIDE 2 MEQ/ML IV SOLN
80.0000 meq | INTRAVENOUS | Status: DC
  Filled 2012-08-29: qty 40

## 2012-08-29 MED ORDER — VANCOMYCIN HCL 10 G IV SOLR
1250.0000 mg | INTRAVENOUS | Status: AC
  Administered 2012-08-30: 1250 mg via INTRAVENOUS
  Filled 2012-08-29 (×2): qty 1250

## 2012-08-29 MED ORDER — NITROGLYCERIN IN D5W 200-5 MCG/ML-% IV SOLN
2.0000 ug/min | INTRAVENOUS | Status: AC
  Administered 2012-08-30: 5 ug/min via INTRAVENOUS
  Filled 2012-08-29: qty 250

## 2012-08-29 NOTE — Telephone Encounter (Signed)
Completed heart surgery education with patient last week at office visit.  Book was given at that time.  Answered all questions and told to call office if any other concerns came up.

## 2012-08-29 NOTE — Progress Notes (Signed)
Cardiologist is Dr.Scott Diona Browner with last office visit in epic  Echo report in epic from 08/25/12  Stress test in epic from 07-26-12  Heart cath report in epic from 08-16-12  Dr.Burdine with [facility] is Medical Md

## 2012-08-29 NOTE — Progress Notes (Signed)
08/29/12 1112  OBSTRUCTIVE SLEEP APNEA  Have you ever been diagnosed with sleep apnea through a sleep study? No  Do you snore loudly (loud enough to be heard through closed doors)?  0  Do you often feel tired, fatigued, or sleepy during the daytime? 0  Has anyone observed you stop breathing during your sleep? 0  Do you have, or are you being treated for high blood pressure? 1  BMI more than 35 kg/m2? 0  Age over 77 years old? 1  Neck circumference greater than 40 cm/18 inches? 1  Gender: 1  Obstructive Sleep Apnea Score 4  Score 4 or greater  Results sent to PCP

## 2012-08-29 NOTE — Progress Notes (Signed)
Pre-op Cardiac Surgery  Carotid Findings:  No ICA stenosis.  Vertebral artery flow is antegrade.  Upper Extremity Right Left  Brachial Pressures 154 T 150 T  Radial Waveforms T T  Ulnar Waveforms T T  Palmar Arch (Allen's Test) Doppler signal obliterates with radial compression and remains normal with ulnar compression WNL   Findings:      Lower  Extremity Right Left  Dorsalis Pedis 197 B 153B  Anterior Tibial      Posterior Tibial 127 B 127 B  Ankle/Brachial Indices >1 0.99    Findings:  WNL

## 2012-08-29 NOTE — Progress Notes (Signed)
Confirmed that PFT's are at 12 Dopplers at 1400

## 2012-08-29 NOTE — Pre-Procedure Instructions (Signed)
Adam Peck  08/29/2012   Your procedure is scheduled on:  Tues, April 22 @ 7:30 AM  Report to [facility] at 5:30 AM.  Call this number if you have problems the morning of surgery: [phone]   Remember:   Do not eat food or drink liquids after midnight.   Take these medicines the morning of surgery with A SIP OF WATER: Amlodipine(Norvasc),Esomeprazole(Nexium),Levothyroxine(Synthroid),and Tamsulosin(Flomax)   Do not wear jewelry  Do not wear lotions, powders, or colognes. You may wear deodorant.  Men may shave face and neck.  Do not bring valuables to the hospital.  Contacts, dentures or bridgework may not be worn into surgery.  Leave suitcase in the car. After surgery it may be brought to your room.  For patients admitted to the hospital, checkout time is 11:00 AM the day of  discharge.   Patients discharged the day of surgery will not be allowed to drive  home.    Special Instructions: Shower using CHG 2 nights before surgery and the night before surgery.  If you shower the day of surgery use CHG.  Use special wash - you have one bottle of CHG for all showers.  You should use approximately 1/3 of the bottle for each shower.   Please read over the following fact sheets that you were given: Pain Booklet, Coughing and Deep Breathing, Blood Transfusion Information, Open Heart Packet, MRSA Information and Surgical Site Infection Prevention

## 2012-08-30 ENCOUNTER — Encounter (HOSPITAL_COMMUNITY): Payer: Self-pay | Admitting: Certified Registered"

## 2012-08-30 ENCOUNTER — Encounter (HOSPITAL_COMMUNITY)
Admission: RE | Disposition: A | Discharge status: Skilled Nursing Facility | Payer: Self-pay | Source: Ambulatory Visit | Attending: Cardiothoracic Surgery

## 2012-08-30 ENCOUNTER — Inpatient Hospital Stay (HOSPITAL_COMMUNITY): Payer: Medicare Other

## 2012-08-30 ENCOUNTER — Inpatient Hospital Stay (HOSPITAL_COMMUNITY): Payer: Medicare Other | Admitting: Anesthesiology

## 2012-08-30 ENCOUNTER — Inpatient Hospital Stay (HOSPITAL_COMMUNITY)
Admission: RE | Admit: 2012-08-30 | Discharge: 2012-09-07 | DRG: 236 | Disposition: A | Discharge status: Skilled Nursing Facility | Payer: Medicare Other | Source: Ambulatory Visit | Attending: Cardiothoracic Surgery | Admitting: Cardiothoracic Surgery

## 2012-08-30 ENCOUNTER — Other Ambulatory Visit (HOSPITAL_COMMUNITY): Payer: Medicare Other

## 2012-08-30 ENCOUNTER — Encounter (HOSPITAL_COMMUNITY): Payer: Self-pay | Admitting: Anesthesiology

## 2012-08-30 DIAGNOSIS — I498 Other specified cardiac arrhythmias: Secondary | ICD-10-CM | POA: Diagnosis not present

## 2012-08-30 DIAGNOSIS — E876 Hypokalemia: Secondary | ICD-10-CM | POA: Diagnosis not present

## 2012-08-30 DIAGNOSIS — I2510 Atherosclerotic heart disease of native coronary artery without angina pectoris: Principal | ICD-10-CM | POA: Diagnosis present

## 2012-08-30 DIAGNOSIS — Z981 Arthrodesis status: Secondary | ICD-10-CM

## 2012-08-30 DIAGNOSIS — Z9861 Coronary angioplasty status: Secondary | ICD-10-CM

## 2012-08-30 DIAGNOSIS — E119 Type 2 diabetes mellitus without complications: Secondary | ICD-10-CM | POA: Diagnosis present

## 2012-08-30 DIAGNOSIS — Z951 Presence of aortocoronary bypass graft: Secondary | ICD-10-CM

## 2012-08-30 DIAGNOSIS — K219 Gastro-esophageal reflux disease without esophagitis: Secondary | ICD-10-CM | POA: Diagnosis present

## 2012-08-30 DIAGNOSIS — J90 Pleural effusion, not elsewhere classified: Secondary | ICD-10-CM | POA: Diagnosis not present

## 2012-08-30 DIAGNOSIS — Z79899 Other long term (current) drug therapy: Secondary | ICD-10-CM

## 2012-08-30 DIAGNOSIS — Z7982 Long term (current) use of aspirin: Secondary | ICD-10-CM

## 2012-08-30 DIAGNOSIS — J988 Other specified respiratory disorders: Secondary | ICD-10-CM | POA: Diagnosis not present

## 2012-08-30 DIAGNOSIS — R443 Hallucinations, unspecified: Secondary | ICD-10-CM | POA: Diagnosis not present

## 2012-08-30 DIAGNOSIS — K5900 Constipation, unspecified: Secondary | ICD-10-CM | POA: Diagnosis not present

## 2012-08-30 DIAGNOSIS — D62 Acute posthemorrhagic anemia: Secondary | ICD-10-CM | POA: Diagnosis not present

## 2012-08-30 DIAGNOSIS — IMO0002 Reserved for concepts with insufficient information to code with codable children: Secondary | ICD-10-CM | POA: Diagnosis not present

## 2012-08-30 DIAGNOSIS — E039 Hypothyroidism, unspecified: Secondary | ICD-10-CM | POA: Diagnosis present

## 2012-08-30 DIAGNOSIS — Z01812 Encounter for preprocedural laboratory examination: Secondary | ICD-10-CM

## 2012-08-30 DIAGNOSIS — I10 Essential (primary) hypertension: Secondary | ICD-10-CM | POA: Diagnosis present

## 2012-08-30 DIAGNOSIS — F411 Generalized anxiety disorder: Secondary | ICD-10-CM | POA: Diagnosis not present

## 2012-08-30 DIAGNOSIS — N289 Disorder of kidney and ureter, unspecified: Secondary | ICD-10-CM | POA: Diagnosis not present

## 2012-08-30 DIAGNOSIS — N400 Enlarged prostate without lower urinary tract symptoms: Secondary | ICD-10-CM | POA: Diagnosis present

## 2012-08-30 DIAGNOSIS — E785 Hyperlipidemia, unspecified: Secondary | ICD-10-CM | POA: Diagnosis present

## 2012-08-30 DIAGNOSIS — I4891 Unspecified atrial fibrillation: Secondary | ICD-10-CM | POA: Diagnosis not present

## 2012-08-30 DIAGNOSIS — D696 Thrombocytopenia, unspecified: Secondary | ICD-10-CM | POA: Diagnosis not present

## 2012-08-30 DIAGNOSIS — Z87891 Personal history of nicotine dependence: Secondary | ICD-10-CM

## 2012-08-30 DIAGNOSIS — J9819 Other pulmonary collapse: Secondary | ICD-10-CM | POA: Diagnosis not present

## 2012-08-30 DIAGNOSIS — E8779 Other fluid overload: Secondary | ICD-10-CM | POA: Diagnosis not present

## 2012-08-30 HISTORY — PX: INTRAOPERATIVE TRANSESOPHAGEAL ECHOCARDIOGRAM: SHX5062

## 2012-08-30 HISTORY — PX: CORONARY ARTERY BYPASS GRAFT: SHX141

## 2012-08-30 LAB — POCT I-STAT 3, ART BLOOD GAS (G3+)
Acid-base deficit: 1 mmol/L (ref 0.0–2.0)
Acid-base deficit: 3 mmol/L — ABNORMAL HIGH (ref 0.0–2.0)
Acid-base deficit: 1 mmol/L (ref 0.0–2.0)
Bicarbonate: 26.6 mEq/L — ABNORMAL HIGH (ref 20.0–24.0)
Bicarbonate: 24.7 mEq/L — ABNORMAL HIGH (ref 20.0–24.0)
Bicarbonate: 23.8 mEq/L (ref 20.0–24.0)
Bicarbonate: 22.7 mEq/L (ref 20.0–24.0)
Bicarbonate: 24.2 mEq/L — ABNORMAL HIGH (ref 20.0–24.0)
O2 Saturation: 100 %
O2 Saturation: 100 %
O2 Saturation: 100 %
O2 Saturation: 93 %
O2 Saturation: 94 %
Patient temperature: 36.3
TCO2: 28 mmol/L (ref 0–100)
TCO2: 26 mmol/L (ref 0–100)
TCO2: 25 mmol/L (ref 0–100)
TCO2: 24 mmol/L (ref 0–100)
TCO2: 25 mmol/L (ref 0–100)
pCO2 arterial: 48 mmHg — ABNORMAL HIGH (ref 35.0–45.0)
pCO2 arterial: 39.8 mmHg (ref 35.0–45.0)
pCO2 arterial: 41.4 mmHg (ref 35.0–45.0)
pCO2 arterial: 41.6 mmHg (ref 35.0–45.0)
pCO2 arterial: 42.1 mmHg (ref 35.0–45.0)
pH, Arterial: 7.351 (ref 7.350–7.450)
pH, Arterial: 7.401 (ref 7.350–7.450)
pH, Arterial: 7.368 (ref 7.350–7.450)
pH, Arterial: 7.342 — ABNORMAL LOW (ref 7.350–7.450)
pH, Arterial: 7.367 (ref 7.350–7.450)
pO2, Arterial: 430 mmHg — ABNORMAL HIGH (ref 80.0–100.0)
pO2, Arterial: 356 mmHg — ABNORMAL HIGH (ref 80.0–100.0)
pO2, Arterial: 255 mmHg — ABNORMAL HIGH (ref 80.0–100.0)
pO2, Arterial: 68 mmHg — ABNORMAL LOW (ref 80.0–100.0)
pO2, Arterial: 73 mmHg — ABNORMAL LOW (ref 80.0–100.0)

## 2012-08-30 LAB — POCT I-STAT 4, (NA,K, GLUC, HGB,HCT)
Glucose, Bld: 143 mg/dL — ABNORMAL HIGH (ref 70–99)
Glucose, Bld: 108 mg/dL — ABNORMAL HIGH (ref 70–99)
Glucose, Bld: 121 mg/dL — ABNORMAL HIGH (ref 70–99)
Glucose, Bld: 151 mg/dL — ABNORMAL HIGH (ref 70–99)
Glucose, Bld: 175 mg/dL — ABNORMAL HIGH (ref 70–99)
Glucose, Bld: 159 mg/dL — ABNORMAL HIGH (ref 70–99)
HCT: 40 % (ref 39.0–52.0)
HCT: 28 % — ABNORMAL LOW (ref 39.0–52.0)
HCT: 35 % — ABNORMAL LOW (ref 39.0–52.0)
HCT: 31 % — ABNORMAL LOW (ref 39.0–52.0)
HCT: 29 % — ABNORMAL LOW (ref 39.0–52.0)
HCT: 37 % — ABNORMAL LOW (ref 39.0–52.0)
Hemoglobin: 13.6 g/dL (ref 13.0–17.0)
Hemoglobin: 11.9 g/dL — ABNORMAL LOW (ref 13.0–17.0)
Hemoglobin: 9.5 g/dL — ABNORMAL LOW (ref 13.0–17.0)
Hemoglobin: 10.5 g/dL — ABNORMAL LOW (ref 13.0–17.0)
Hemoglobin: 9.9 g/dL — ABNORMAL LOW (ref 13.0–17.0)
Hemoglobin: 12.6 g/dL — ABNORMAL LOW (ref 13.0–17.0)
Potassium: 3.8 mEq/L (ref 3.5–5.1)
Potassium: 3.7 mEq/L (ref 3.5–5.1)
Potassium: 3.9 mEq/L (ref 3.5–5.1)
Potassium: 3.7 mEq/L (ref 3.5–5.1)
Potassium: 3.6 mEq/L (ref 3.5–5.1)
Potassium: 3.3 mEq/L — ABNORMAL LOW (ref 3.5–5.1)
Sodium: 145 mEq/L (ref 135–145)
Sodium: 143 mEq/L (ref 135–145)
Sodium: 143 mEq/L (ref 135–145)
Sodium: 144 mEq/L (ref 135–145)
Sodium: 142 mEq/L (ref 135–145)
Sodium: 143 mEq/L (ref 135–145)

## 2012-08-30 LAB — CBC
HCT: 34.6 % — ABNORMAL LOW (ref 39.0–52.0)
HCT: 32.9 % — ABNORMAL LOW (ref 39.0–52.0)
Hemoglobin: 11 g/dL — ABNORMAL LOW (ref 13.0–17.0)
MCH: 27.1 pg (ref 26.0–34.0)
MCHC: 33.8 g/dL (ref 30.0–36.0)
MCHC: 33.4 g/dL (ref 30.0–36.0)
MCV: 81 fL (ref 78.0–100.0)
Platelets: 154 10*3/uL (ref 150–400)
RBC: 4.06 MIL/uL — ABNORMAL LOW (ref 4.22–5.81)
RDW: 13.8 % (ref 11.5–15.5)
RDW: 13.8 % (ref 11.5–15.5)
WBC: 16.3 10*3/uL — ABNORMAL HIGH (ref 4.0–10.5)

## 2012-08-30 LAB — POCT I-STAT GLUCOSE
Glucose, Bld: 130 mg/dL — ABNORMAL HIGH (ref 70–99)
Operator id: 156951

## 2012-08-30 LAB — GLUCOSE, CAPILLARY
Glucose-Capillary: 160 mg/dL — ABNORMAL HIGH (ref 70–99)
Glucose-Capillary: 112 mg/dL — ABNORMAL HIGH (ref 70–99)
Glucose-Capillary: 121 mg/dL — ABNORMAL HIGH (ref 70–99)

## 2012-08-30 LAB — CREATININE, SERUM
Creatinine, Ser: 1.04 mg/dL (ref 0.50–1.35)
GFR calc Af Amer: 78 mL/min — ABNORMAL LOW (ref >90)
GFR calc non Af Amer: 68 mL/min — ABNORMAL LOW (ref >90)

## 2012-08-30 LAB — MAGNESIUM: Magnesium: 2.6 mg/dL — ABNORMAL HIGH (ref 1.5–2.5)

## 2012-08-30 LAB — PROTIME-INR
INR: 1.31 (ref 0.00–1.49)
Prothrombin Time: 16 seconds — ABNORMAL HIGH (ref 11.6–15.2)

## 2012-08-30 LAB — HEMOGLOBIN AND HEMATOCRIT, BLOOD
HCT: 28.1 % — ABNORMAL LOW (ref 39.0–52.0)
Hemoglobin: 9.7 g/dL — ABNORMAL LOW (ref 13.0–17.0)

## 2012-08-30 LAB — PLATELET COUNT: Platelets: 138 10*3/uL — ABNORMAL LOW (ref 150–400)

## 2012-08-30 SURGERY — CORONARY ARTERY BYPASS GRAFTING (CABG)
Anesthesia: General | Site: Chest | Wound class: Clean

## 2012-08-30 MED ORDER — METOPROLOL TARTRATE 25 MG/10 ML ORAL SUSPENSION
12.5000 mg | Freq: Two times a day (BID) | ORAL | Status: DC
  Filled 2012-08-30 (×7): qty 5

## 2012-08-30 MED ORDER — METOPROLOL TARTRATE 1 MG/ML IV SOLN
2.5000 mg | INTRAVENOUS | Status: DC | PRN
  Administered 2012-09-01: 2.5 mg via INTRAVENOUS
  Filled 2012-08-30: qty 5

## 2012-08-30 MED ORDER — ERGOCALCIFEROL 1.25 MG (50000 UT) PO CAPS
50000.0000 [IU] | ORAL_CAPSULE | ORAL | Status: DC
  Administered 2012-09-01: 50000 [IU] via ORAL
  Filled 2012-08-30: qty 1

## 2012-08-30 MED ORDER — DOPAMINE-DEXTROSE 3.2-5 MG/ML-% IV SOLN: 0.0000 ug/kg/min | INTRAVENOUS | Status: DC

## 2012-08-30 MED ORDER — LACTATED RINGERS IV SOLN: 500.0000 mL | Freq: Once | INTRAVENOUS | Status: DC | PRN

## 2012-08-30 MED ORDER — VANCOMYCIN HCL IN DEXTROSE 1-5 GM/200ML-% IV SOLN
1000.0000 mg | Freq: Once | INTRAVENOUS | Status: AC
  Administered 2012-08-30: 1000 mg via INTRAVENOUS
  Filled 2012-08-30: qty 200

## 2012-08-30 MED ORDER — OXYCODONE HCL 5 MG PO TABS
5.0000 mg | ORAL_TABLET | ORAL | Status: DC | PRN
  Administered 2012-08-30 – 2012-09-01 (×9): 10 mg via ORAL
  Filled 2012-08-30 (×8): qty 2

## 2012-08-30 MED ORDER — ROCURONIUM BROMIDE 100 MG/10ML IV SOLN
INTRAVENOUS | Status: DC | PRN
  Administered 2012-08-30: 20 mg via INTRAVENOUS
  Administered 2012-08-30 (×3): 30 mg via INTRAVENOUS
  Administered 2012-08-30: 20 mg via INTRAVENOUS
  Administered 2012-08-30: 100 mg via INTRAVENOUS
  Administered 2012-08-30: 50 mg via INTRAVENOUS

## 2012-08-30 MED ORDER — PANTOPRAZOLE SODIUM 40 MG PO TBEC: 40.0000 mg | DELAYED_RELEASE_TABLET | Freq: Every day | ORAL | Status: DC

## 2012-08-30 MED ORDER — ACETAMINOPHEN 10 MG/ML IV SOLN
1000.0000 mg | Freq: Once | INTRAVENOUS | Status: AC
  Administered 2012-08-30: 1000 mg via INTRAVENOUS
  Filled 2012-08-30: qty 100

## 2012-08-30 MED ORDER — DEXMEDETOMIDINE HCL IN NACL 200 MCG/50ML IV SOLN: 0.1000 ug/kg/h | INTRAVENOUS | Status: DC

## 2012-08-30 MED ORDER — BISACODYL 5 MG PO TBEC
10.0000 mg | DELAYED_RELEASE_TABLET | Freq: Every day | ORAL | Status: DC
  Administered 2012-08-31 – 2012-09-07 (×6): 10 mg via ORAL
  Filled 2012-08-30 (×5): qty 2

## 2012-08-30 MED ORDER — DOCUSATE SODIUM 100 MG PO CAPS
200.0000 mg | ORAL_CAPSULE | Freq: Every day | ORAL | Status: DC
  Administered 2012-08-31 – 2012-09-07 (×7): 200 mg via ORAL
  Filled 2012-08-30 (×8): qty 2

## 2012-08-30 MED ORDER — SODIUM CHLORIDE 0.9 % IJ SOLN
3.0000 mL | Freq: Two times a day (BID) | INTRAMUSCULAR | Status: DC
  Administered 2012-08-31 – 2012-09-02 (×5): 3 mL via INTRAVENOUS

## 2012-08-30 MED ORDER — SODIUM CHLORIDE 0.9 % IV SOLN
INTRAVENOUS | Status: DC
  Administered 2012-08-30: 21:00:00 via INTRAVENOUS
  Filled 2012-08-30 (×2): qty 1

## 2012-08-30 MED ORDER — MORPHINE SULFATE 2 MG/ML IJ SOLN
1.0000 mg | INTRAMUSCULAR | Status: AC | PRN
  Filled 2012-08-30: qty 1

## 2012-08-30 MED ORDER — ACETAMINOPHEN 500 MG PO TABS
1000.0000 mg | ORAL_TABLET | Freq: Four times a day (QID) | ORAL | Status: AC
  Administered 2012-08-31 – 2012-09-04 (×18): 1000 mg via ORAL
  Filled 2012-08-30 (×22): qty 2

## 2012-08-30 MED ORDER — ALBUMIN HUMAN 5 % IV SOLN
250.0000 mL | INTRAVENOUS | Status: AC | PRN
  Administered 2012-08-30 (×2): 250 mL via INTRAVENOUS

## 2012-08-30 MED ORDER — ALPRAZOLAM 0.25 MG PO TABS
0.2500 mg | ORAL_TABLET | Freq: Every evening | ORAL | Status: DC | PRN
  Administered 2012-08-30 – 2012-09-06 (×8): 0.25 mg via ORAL
  Filled 2012-08-30 (×9): qty 1

## 2012-08-30 MED ORDER — METOPROLOL TARTRATE 12.5 MG HALF TABLET
12.5000 mg | ORAL_TABLET | Freq: Two times a day (BID) | ORAL | Status: DC
  Administered 2012-08-31 – 2012-09-05 (×10): 12.5 mg via ORAL
  Filled 2012-08-30 (×13): qty 1

## 2012-08-30 MED ORDER — HEPARIN SODIUM (PORCINE) 1000 UNIT/ML IJ SOLN
INTRAMUSCULAR | Status: DC | PRN
  Administered 2012-08-30: 36000 [IU] via INTRAVENOUS
  Administered 2012-08-30 (×2): 3000 [IU] via INTRAVENOUS

## 2012-08-30 MED ORDER — MORPHINE SULFATE 2 MG/ML IJ SOLN
2.0000 mg | INTRAMUSCULAR | Status: DC | PRN
  Administered 2012-08-30: 2 mg via INTRAVENOUS
  Administered 2012-08-31: 4 mg via INTRAVENOUS
  Administered 2012-08-31: 2 mg via INTRAVENOUS
  Filled 2012-08-30: qty 2
  Filled 2012-08-30: qty 1

## 2012-08-30 MED ORDER — PANTOPRAZOLE SODIUM 40 MG PO TBEC
80.0000 mg | DELAYED_RELEASE_TABLET | Freq: Every morning | ORAL | Status: DC
  Administered 2012-08-31 – 2012-09-07 (×8): 80 mg via ORAL
  Filled 2012-08-30 (×4): qty 2
  Filled 2012-08-30 (×2): qty 1
  Filled 2012-08-30: qty 2
  Filled 2012-08-30 (×4): qty 1

## 2012-08-30 MED ORDER — PHENYLEPHRINE HCL 10 MG/ML IJ SOLN
20.0000 mg | INTRAMUSCULAR | Status: DC | PRN
  Administered 2012-08-30: 25 ug/min via INTRAVENOUS

## 2012-08-30 MED ORDER — MAGNESIUM SULFATE 40 MG/ML IJ SOLN
4.0000 g | Freq: Once | INTRAMUSCULAR | Status: AC
  Administered 2012-08-30: 4 g via INTRAVENOUS
  Filled 2012-08-30: qty 100

## 2012-08-30 MED ORDER — ONDANSETRON HCL 4 MG/2ML IJ SOLN
4.0000 mg | Freq: Four times a day (QID) | INTRAMUSCULAR | Status: DC | PRN
  Administered 2012-08-30 – 2012-09-04 (×6): 4 mg via INTRAVENOUS
  Filled 2012-08-30 (×6): qty 2

## 2012-08-30 MED ORDER — PHENYLEPHRINE HCL 10 MG/ML IJ SOLN
0.0000 ug/min | INTRAVENOUS | Status: DC
  Filled 2012-08-30: qty 2

## 2012-08-30 MED ORDER — ACETAMINOPHEN 160 MG/5ML PO SOLN
975.0000 mg | Freq: Four times a day (QID) | ORAL | Status: AC
  Filled 2012-08-30: qty 40.6

## 2012-08-30 MED ORDER — SODIUM CHLORIDE 0.9 % IV SOLN
INTRAVENOUS | Status: DC
  Administered 2012-09-01: 18:00:00 via INTRAVENOUS

## 2012-08-30 MED ORDER — INSULIN REGULAR BOLUS VIA INFUSION
0.0000 [IU] | Freq: Three times a day (TID) | INTRAVENOUS | Status: DC
  Filled 2012-08-30: qty 10

## 2012-08-30 MED ORDER — TAMSULOSIN HCL 0.4 MG PO CAPS
0.4000 mg | ORAL_CAPSULE | Freq: Every day | ORAL | Status: DC
  Administered 2012-08-30 – 2012-09-06 (×8): 0.4 mg via ORAL
  Filled 2012-08-30 (×10): qty 1

## 2012-08-30 MED ORDER — LACTATED RINGERS IV SOLN
INTRAVENOUS | Status: DC | PRN
  Administered 2012-08-30: 07:00:00 via INTRAVENOUS

## 2012-08-30 MED ORDER — PROTAMINE SULFATE 10 MG/ML IV SOLN
INTRAVENOUS | Status: DC | PRN
  Administered 2012-08-30: 390 mg via INTRAVENOUS

## 2012-08-30 MED ORDER — ASPIRIN 81 MG PO CHEW: 324.0000 mg | CHEWABLE_TABLET | Freq: Every day | ORAL | Status: DC

## 2012-08-30 MED ORDER — BISACODYL 10 MG RE SUPP: 10.0000 mg | Freq: Every day | RECTAL | Status: DC

## 2012-08-30 MED ORDER — 0.9 % SODIUM CHLORIDE (POUR BTL) OPTIME
TOPICAL | Status: DC | PRN
  Administered 2012-08-30: 6000 mL

## 2012-08-30 MED ORDER — SODIUM CHLORIDE 0.9 % IJ SOLN
3.0000 mL | INTRAMUSCULAR | Status: DC | PRN
  Administered 2012-08-31: 3 mL via INTRAVENOUS

## 2012-08-30 MED ORDER — ASPIRIN EC 325 MG PO TBEC
325.0000 mg | DELAYED_RELEASE_TABLET | Freq: Every day | ORAL | Status: DC
  Administered 2012-08-31 – 2012-09-06 (×7): 325 mg via ORAL
  Filled 2012-08-30 (×7): qty 1

## 2012-08-30 MED ORDER — SODIUM CHLORIDE 0.9 % IV SOLN: 250.0000 mL | INTRAVENOUS | Status: DC

## 2012-08-30 MED ORDER — DEXMEDETOMIDINE HCL IN NACL 200 MCG/50ML IV SOLN
INTRAVENOUS | Status: DC | PRN
  Administered 2012-08-30: 0.3 ug/kg/h via INTRAVENOUS

## 2012-08-30 MED ORDER — POTASSIUM CHLORIDE 10 MEQ/50ML IV SOLN
10.0000 meq | INTRAVENOUS | Status: AC
  Administered 2012-08-30 (×3): 10 meq via INTRAVENOUS

## 2012-08-30 MED ORDER — DEXTROSE 5 % IV SOLN
1.5000 g | Freq: Two times a day (BID) | INTRAVENOUS | Status: AC
  Administered 2012-08-30 – 2012-09-01 (×4): 1.5 g via INTRAVENOUS
  Filled 2012-08-30 (×4): qty 1.5

## 2012-08-30 MED ORDER — LACTATED RINGERS IV SOLN
INTRAVENOUS | Status: DC | PRN
  Administered 2012-08-30 (×2): via INTRAVENOUS

## 2012-08-30 MED ORDER — NITROGLYCERIN IN D5W 200-5 MCG/ML-% IV SOLN: 0.0000 ug/min | INTRAVENOUS | Status: DC

## 2012-08-30 MED ORDER — SODIUM CHLORIDE 0.9 % IJ SOLN
OROMUCOSAL | Status: DC | PRN
  Administered 2012-08-30 (×3): via TOPICAL

## 2012-08-30 MED ORDER — HEMOSTATIC AGENTS (NO CHARGE) OPTIME
TOPICAL | Status: DC | PRN
  Administered 2012-08-30: 1 via TOPICAL

## 2012-08-30 MED ORDER — ALBUMIN HUMAN 5 % IV SOLN
INTRAVENOUS | Status: DC | PRN
  Administered 2012-08-30: 12:00:00 via INTRAVENOUS

## 2012-08-30 MED ORDER — LEVOTHYROXINE SODIUM 75 MCG PO TABS
75.0000 ug | ORAL_TABLET | Freq: Every day | ORAL | Status: DC
  Administered 2012-08-31 – 2012-09-07 (×8): 75 ug via ORAL
  Filled 2012-08-30 (×9): qty 1

## 2012-08-30 MED ORDER — SUFENTANIL CITRATE 50 MCG/ML IV SOLN
INTRAVENOUS | Status: DC | PRN
  Administered 2012-08-30 (×4): 10 ug via INTRAVENOUS
  Administered 2012-08-30: 40 ug via INTRAVENOUS
  Administered 2012-08-30: 10 ug via INTRAVENOUS
  Administered 2012-08-30: 20 ug via INTRAVENOUS
  Administered 2012-08-30: 10 ug via INTRAVENOUS

## 2012-08-30 MED ORDER — POTASSIUM CHLORIDE 10 MEQ/50ML IV SOLN
10.0000 meq | Freq: Once | INTRAVENOUS | Status: AC
  Administered 2012-08-30: 10 meq via INTRAVENOUS

## 2012-08-30 MED ORDER — PROPOFOL 10 MG/ML IV BOLUS
INTRAVENOUS | Status: DC | PRN
  Administered 2012-08-30: 50 mg via INTRAVENOUS

## 2012-08-30 MED ORDER — MIDAZOLAM HCL 5 MG/5ML IJ SOLN
INTRAMUSCULAR | Status: DC | PRN
  Administered 2012-08-30 (×5): 2 mg via INTRAVENOUS

## 2012-08-30 MED ORDER — FAMOTIDINE IN NACL 20-0.9 MG/50ML-% IV SOLN
20.0000 mg | Freq: Two times a day (BID) | INTRAVENOUS | Status: AC
  Administered 2012-08-30: 20 mg via INTRAVENOUS

## 2012-08-30 MED ORDER — MIDAZOLAM HCL 2 MG/2ML IJ SOLN: 2.0000 mg | INTRAMUSCULAR | Status: DC | PRN

## 2012-08-30 MED ORDER — SODIUM CHLORIDE 0.45 % IV SOLN
INTRAVENOUS | Status: DC
  Administered 2012-08-30 – 2012-08-31 (×2): via INTRAVENOUS

## 2012-08-30 MED ORDER — LACTATED RINGERS IV SOLN: INTRAVENOUS | Status: DC

## 2012-08-30 MED ORDER — METOCLOPRAMIDE HCL 5 MG/ML IJ SOLN
10.0000 mg | Freq: Four times a day (QID) | INTRAMUSCULAR | Status: AC
  Administered 2012-08-30 – 2012-08-31 (×3): 10 mg via INTRAVENOUS
  Filled 2012-08-30 (×4): qty 2

## 2012-08-30 SURGICAL SUPPLY — 119 items
ADAPTER CARDIO PERF ANTE/RETRO (ADAPTER) ×4 IMPLANT
ADPR PRFSN 84XANTGRD RTRGD (ADAPTER) ×2
ATTRACTOMAT 16X20 MAGNETIC DRP (DRAPES) ×4 IMPLANT
BAG DECANTER FOR FLEXI CONT (MISCELLANEOUS) ×4 IMPLANT
BANDAGE ELASTIC 4 VELCRO ST LF (GAUZE/BANDAGES/DRESSINGS) ×4 IMPLANT
BANDAGE ELASTIC 6 VELCRO ST LF (GAUZE/BANDAGES/DRESSINGS) ×4 IMPLANT
BANDAGE GAUZE ELAST BULKY 4 IN (GAUZE/BANDAGES/DRESSINGS) ×4 IMPLANT
BASKET HEART  (ORDER IN 25'S) (MISCELLANEOUS) ×1
BASKET HEART (ORDER IN 25'S) (MISCELLANEOUS) ×1
BASKET HEART (ORDER IN 25S) (MISCELLANEOUS) ×2 IMPLANT
BLADE STERNUM SYSTEM 6 (BLADE) ×4 IMPLANT
BLADE SURG 11 STRL SS (BLADE) ×2 IMPLANT
BLADE SURG 12 STRL SS (BLADE) ×4 IMPLANT
BLADE SURG ROTATE 9660 (MISCELLANEOUS) IMPLANT
CANISTER SUCTION 2500CC (MISCELLANEOUS) ×4 IMPLANT
CANN PRFSN .5XCNCT 15X34-48 (MISCELLANEOUS) ×2
CANNULA AORTIC HI-FLOW 6.5M20F (CANNULA) ×4 IMPLANT
CANNULA GUNDRY RCSP 15FR (MISCELLANEOUS) ×4 IMPLANT
CANNULA PRFSN .5XCNCT 15X34-48 (MISCELLANEOUS) IMPLANT
CANNULA VEN 2 STAGE (MISCELLANEOUS) ×4
CANNULA VENOUS LOW PROF 34X46 (CANNULA) ×2 IMPLANT
CANNULA VENOUS MAL SGL STG 40 (MISCELLANEOUS) IMPLANT
CANNULAE VENOUS MAL SGL STG 40 (MISCELLANEOUS)
CATH CPB KIT VANTRIGT (MISCELLANEOUS) ×4 IMPLANT
CATH ROBINSON RED A/P 18FR (CATHETERS) ×12 IMPLANT
CATH THORACIC 28FR (CATHETERS) IMPLANT
CATH THORACIC 28FR RT ANG (CATHETERS) IMPLANT
CATH THORACIC 36FR (CATHETERS) IMPLANT
CATH THORACIC 36FR RT ANG (CATHETERS) ×8 IMPLANT
CLIP FOGARTY SPRING 6M (CLIP) ×2 IMPLANT
CLIP TI WIDE RED SMALL 24 (CLIP) ×2 IMPLANT
CLOTH BEACON ORANGE TIMEOUT ST (SAFETY) ×4 IMPLANT
COVER SURGICAL LIGHT HANDLE (MISCELLANEOUS) ×4 IMPLANT
CRADLE DONUT ADULT HEAD (MISCELLANEOUS) ×4 IMPLANT
DRAIN CHANNEL 32F RND 10.7 FF (WOUND CARE) ×4 IMPLANT
DRAPE CARDIOVASCULAR INCISE (DRAPES) ×4
DRAPE SLUSH MACHINE 52X66 (DRAPES) IMPLANT
DRAPE SLUSH/WARMER DISC (DRAPES) ×2 IMPLANT
DRAPE SRG 135X102X78XABS (DRAPES) ×2 IMPLANT
DRSG COVADERM 4X14 (GAUZE/BANDAGES/DRESSINGS) ×4 IMPLANT
ELECT BLADE 4.0 EZ CLEAN MEGAD (MISCELLANEOUS) ×4
ELECT BLADE 6.5 EXT (BLADE) ×4 IMPLANT
ELECT CAUTERY BLADE 6.4 (BLADE) ×4 IMPLANT
ELECT REM PT RETURN 9FT ADLT (ELECTROSURGICAL) ×8
ELECTRODE BLDE 4.0 EZ CLN MEGD (MISCELLANEOUS) ×2 IMPLANT
ELECTRODE REM PT RTRN 9FT ADLT (ELECTROSURGICAL) ×4 IMPLANT
GLOVE BIO SURGEON STRL SZ 6 (GLOVE) ×12 IMPLANT
GLOVE BIO SURGEON STRL SZ 6.5 (GLOVE) ×4 IMPLANT
GLOVE BIO SURGEON STRL SZ7.5 (GLOVE) ×8 IMPLANT
GLOVE BIO SURGEON STRL SZ8 (GLOVE) ×2 IMPLANT
GLOVE BIO SURGEONS STRL SZ 6.5 (GLOVE) ×4
GLOVE BIOGEL PI IND STRL 7.0 (GLOVE) IMPLANT
GLOVE BIOGEL PI INDICATOR 7.0 (GLOVE) ×10
GOWN STRL NON-REIN LRG LVL3 (GOWN DISPOSABLE) ×24 IMPLANT
HEMOSTAT POWDER SURGIFOAM 1G (HEMOSTASIS) ×12 IMPLANT
HEMOSTAT SURGICEL 2X14 (HEMOSTASIS) ×4 IMPLANT
INSERT FOGARTY XLG (MISCELLANEOUS) ×2 IMPLANT
KIT BASIN OR (CUSTOM PROCEDURE TRAY) ×4 IMPLANT
KIT ROOM TURNOVER OR (KITS) ×4 IMPLANT
KIT SUCTION CATH 14FR (SUCTIONS) ×4 IMPLANT
KIT VASOVIEW W/TROCAR VH 2000 (KITS) ×4 IMPLANT
LEAD PACING MYOCARDI (MISCELLANEOUS) ×4 IMPLANT
MARKER GRAFT CORONARY BYPASS (MISCELLANEOUS) ×12 IMPLANT
MATRIX HEMOSTAT SURGIFLO (HEMOSTASIS) ×2 IMPLANT
NS IRRIG 1000ML POUR BTL (IV SOLUTION) ×22 IMPLANT
PACK OPEN HEART (CUSTOM PROCEDURE TRAY) ×4 IMPLANT
PAD ARMBOARD 7.5X6 YLW CONV (MISCELLANEOUS) ×8 IMPLANT
PAD ONESTEP ZOLL R SERIES ADT (MISCELLANEOUS) ×2 IMPLANT
PENCIL BUTTON HOLSTER BLD 10FT (ELECTRODE) ×4 IMPLANT
PUNCH AORTIC ROTATE 4.0MM (MISCELLANEOUS) IMPLANT
PUNCH AORTIC ROTATE 4.5MM 8IN (MISCELLANEOUS) ×2 IMPLANT
PUNCH AORTIC ROTATE 5MM 8IN (MISCELLANEOUS) IMPLANT
SET CARDIOPLEGIA MPS 5001102 (MISCELLANEOUS) ×2 IMPLANT
SOLUTION ANTI FOG 6CC (MISCELLANEOUS) ×2 IMPLANT
SPONGE GAUZE 4X4 12PLY (GAUZE/BANDAGES/DRESSINGS) ×10 IMPLANT
SPONGE LAP 18X18 X RAY DECT (DISPOSABLE) ×2 IMPLANT
SPONGE LAP 4X18 X RAY DECT (DISPOSABLE) ×2 IMPLANT
SUT BONE WAX W31G (SUTURE) ×4 IMPLANT
SUT MNCRL AB 4-0 PS2 18 (SUTURE) ×2 IMPLANT
SUT PROLENE 3 0 SH DA (SUTURE) IMPLANT
SUT PROLENE 3 0 SH1 36 (SUTURE) IMPLANT
SUT PROLENE 4 0 RB 1 (SUTURE) ×12
SUT PROLENE 4 0 SH DA (SUTURE) ×4 IMPLANT
SUT PROLENE 4-0 RB1 .5 CRCL 36 (SUTURE) ×2 IMPLANT
SUT PROLENE 5 0 C 1 36 (SUTURE) IMPLANT
SUT PROLENE 6 0 C 1 30 (SUTURE) ×2 IMPLANT
SUT PROLENE 6 0 CC (SUTURE) ×6 IMPLANT
SUT PROLENE 7 0 DA (SUTURE) IMPLANT
SUT PROLENE 7.0 RB 3 (SUTURE) ×12 IMPLANT
SUT PROLENE 8 0 BV175 6 (SUTURE) ×4 IMPLANT
SUT PROLENE BLUE 7 0 (SUTURE) ×6 IMPLANT
SUT PROLENE POLY MONO (SUTURE) ×4 IMPLANT
SUT SILK  1 MH (SUTURE)
SUT SILK 1 MH (SUTURE) IMPLANT
SUT SILK 2 0 SH CR/8 (SUTURE) ×2 IMPLANT
SUT SILK 3 0 SH CR/8 (SUTURE) IMPLANT
SUT STEEL 6MS V (SUTURE) ×8 IMPLANT
SUT STEEL STERNAL CCS#1 18IN (SUTURE) IMPLANT
SUT STEEL SZ 6 DBL 3X14 BALL (SUTURE) ×4 IMPLANT
SUT VIC AB 1 CTX 18 (SUTURE) ×4 IMPLANT
SUT VIC AB 1 CTX 36 (SUTURE) ×16
SUT VIC AB 1 CTX36XBRD ANBCTR (SUTURE) ×4 IMPLANT
SUT VIC AB 2-0 CT1 27 (SUTURE) ×4
SUT VIC AB 2-0 CT1 TAPERPNT 27 (SUTURE) IMPLANT
SUT VIC AB 2-0 CTX 27 (SUTURE) IMPLANT
SUT VIC AB 3-0 SH 27 (SUTURE)
SUT VIC AB 3-0 SH 27X BRD (SUTURE) IMPLANT
SUT VIC AB 3-0 X1 27 (SUTURE) IMPLANT
SUT VICRYL 4-0 PS2 18IN ABS (SUTURE) IMPLANT
SUTURE E-PAK OPEN HEART (SUTURE) ×4 IMPLANT
SYSTEM SAHARA CHEST DRAIN ATS (WOUND CARE) ×4 IMPLANT
TAPE CLOTH SURG 4X10 WHT LF (GAUZE/BANDAGES/DRESSINGS) ×4 IMPLANT
TOWEL OR 17X24 6PK STRL BLUE (TOWEL DISPOSABLE) ×8 IMPLANT
TOWEL OR 17X26 10 PK STRL BLUE (TOWEL DISPOSABLE) ×8 IMPLANT
TRAY FOLEY IC TEMP SENS 14FR (CATHETERS) ×4 IMPLANT
TUBE SUCT INTRACARD DLP 20F (MISCELLANEOUS) ×2 IMPLANT
TUBING INSUFFLATION 10FT LAP (TUBING) ×4 IMPLANT
UNDERPAD 30X30 INCONTINENT (UNDERPADS AND DIAPERS) ×4 IMPLANT
WATER STERILE IRR 1000ML POUR (IV SOLUTION) ×8 IMPLANT

## 2012-08-30 NOTE — Progress Notes (Signed)
T. CTS p.m. Rounds  Status post CABG x4 Extubated with stable hemodynamics 6 hour labs reviewed and are satisfactory Neuro intact

## 2012-08-30 NOTE — Anesthesia Procedure Notes (Addendum)
Procedure Name: Intubation Date/Time: 08/30/2012 7:41 AM Performed by: Sheppard Evens

## 2012-08-30 NOTE — OR Nursing (Signed)
12:30pm - 1st call to SICU.

## 2012-08-30 NOTE — Transfer of Care (Signed)
Immediate Anesthesia Transfer of Care Note  Patient: Adam Peck  Procedure(s) Performed: Procedure(s): CORONARY ARTERY BYPASS GRAFTING (CABG) (N/A) INTRAOPERATIVE TRANSESOPHAGEAL ECHOCARDIOGRAM (N/A)  Patient Location: ICU  Anesthesia Type:General  Level of Consciousness: unresponsive and Patient remains intubated per anesthesia plan  Airway & Oxygen Therapy: Patient remains intubated per anesthesia plan and Patient placed on Ventilator (see vital sign flow sheet for setting)  Post-op Assessment: Post -op Vital signs reviewed and stable  Post vital signs: Reviewed and stable  Complications: No apparent anesthesia complications

## 2012-08-30 NOTE — Progress Notes (Signed)
[address]            [place] [address]          [phone]       Adam Peck [facility] Medical Record #[id number] Date of Birth: May 28, 1933  No ref. provider found Juliette Alcide, MD  Chief Complaint:   No chief complaint on file.   History of Present Illness:     3v CAD   Current Activity/ Functional Status:    Past Medical History  Diagnosis Date  . Coronary atherosclerosis of native coronary artery     DES to the OM and RCA 2005  . Dyslipidemia     unable to take meds for this bc of cramps  . Sinus bradycardia   . ED (erectile dysfunction)   . GERD (gastroesophageal reflux disease)     takes Nexium daily  . Essential hypertension, benign     takes Amlodipine and Losartan daily  . Hypothyroidism     takes Synthroid daily  . Enlarged prostate     takes Flomax daily  . Type 2 diabetes mellitus     takes Metformin daily  . History of bronchitis     last time several yrs ago  . Headache     occasionally  . Tick bite 06/2012  . Carpal tunnel syndrome   . Arthritis   . Joint pain   . Joint swelling   . History of gout   . Urinary urgency   . Nocturia   . Anxiety     doesn't take any meds for this    Past Surgical History  Procedure Laterality Date  . L4-5 laminectomy      x 2   . Cervical fusion    . Hernia repair    . Cardiac catheterization    . Coronary angioplasty      2 stents    History  Smoking status  . Former Smoker  . Types: Cigarettes  . Quit date: 05/11/1968  Smokeless tobacco  . Former Neurosurgeon  . Types: Chew  . Quit date: 05/11/1978    History  Alcohol Use No    History   Social History  . Marital Status: Single    Spouse Name: N/A    Number of Children: N/A  . Years of Education: N/A   Occupational History  . Not on file.   Social History Main Topics  . Smoking status: Former Smoker    Types: Cigarettes    Quit date: 05/11/1968  . Smokeless tobacco: Former Neurosurgeon   Types: Chew    Quit date: 05/11/1978  . Alcohol Use: No  . Drug Use: No  . Sexually Active: No   Other Topics Concern  . Not on file   Social History Narrative  . No narrative on file    Allergies  Allergen Reactions  . Ezetimibe Other (See Comments)    Reaction:muscle cramps  . Statins Other (See Comments)    Reaction:muscle cramps    Current Facility-Administered Medications  Medication Dose Route Frequency Provider Last Rate Last Dose  . aminocaproic acid (AMICAR) 10 g in sodium chloride 0.9 % 100 mL infusion   Intravenous To OR Kerin Perna, MD      . cefUROXime (ZINACEF) 1.5 g in dextrose 5 % 50 mL IVPB  1.5 g Intravenous To OR Kerin Perna, MD      .  cefUROXime (ZINACEF) 750 mg in dextrose 5 % 50 mL IVPB  750 mg Intravenous To OR Kerin Perna, MD      . dexmedetomidine (PRECEDEX) 400 MCG/100ML infusion  0.1-0.7 mcg/kg/hr Intravenous To OR Kerin Perna, MD      . DOPamine (INTROPIN) 800 mg in dextrose 5 % 250 mL infusion  2-20 mcg/kg/min Intravenous To OR Kerin Perna, MD      . EPINEPHrine (ADRENALIN) 4,000 mcg in dextrose 5 % 250 mL infusion  0.5-20 mcg/min Intravenous To OR Kerin Perna, MD      . heparin 2,500 Units, papaverine 30 mg in electrolyte-148 (PLASMALYTE-148) 500 mL irrigation   Irrigation To OR Kerin Perna, MD      . insulin regular (NOVOLIN R,HUMULIN R) 1 Units/mL in sodium chloride 0.9 % 100 mL infusion   Intravenous To OR Kerin Perna, MD      . magnesium sulfate (IV Push/IM) injection 40 mEq  40 mEq Other To OR Kerin Perna, MD      . nitroGLYCERIN 0.2 mg/mL in dextrose 5 % infusion  2-200 mcg/min Intravenous To OR Kerin Perna, MD      . phenylephrine (NEO-SYNEPHRINE) 20,000 mcg in dextrose 5 % 250 mL infusion  30-200 mcg/min Intravenous To OR Kerin Perna, MD      . potassium chloride injection 80 mEq  80 mEq Other To OR Kerin Perna, MD       Facility-Administered Medications Ordered in Other Encounters  Medication  Dose Route Frequency Provider Last Rate Last Dose  . lactated ringers infusion    Continuous PRN Shireen Quan, CRNA      . lactated ringers infusion    Continuous PRN Shireen Quan, CRNA      . lactated ringers infusion    Continuous PRN Shireen Quan, CRNA      . midazolam (VERSED) 5 MG/5ML injection    PRN Shireen Quan, CRNA   2 mg at 08/30/12 0640  . SUFentanil (SUFENTA) injection    PRN Shireen Quan, CRNA   10 mcg at 08/30/12 1610     No family history on file.   Review of Systems:     Cardiac Review of Systems: Y or N  Chest Pain [    ]  Resting SOB [   ] Exertional SOB  [  ]  Orthopnea [  ]   Pedal Edema [   ]    Palpitations [  ] Syncope  [  ]   Presyncope [   ]  General Review of Systems: [Y] = yes [  ]=no Constitional: recent weight change [  ]; anorexia [  ]; fatigue [  ]; nausea [  ]; night sweats [  ]; fever [  ]; or chills [  ];  Dental: poor dentition[  ]; Last Dentist visit:   Eye : blurred vision [  ]; diplopia [   ]; vision changes [  ];  Amaurosis fugax[  ]; Resp: cough [  ];  wheezing[  ];  hemoptysis[  ]; shortness of breath[  ]; paroxysmal nocturnal dyspnea[  ]; dyspnea on exertion[  ]; or orthopnea[  ];  GI:  gallstones[  ], vomiting[  ];  dysphagia[  ]; melena[  ];  hematochezia [  ]; heartburn[  ];   Hx of  Colonoscopy[  ]; GU: kidney stones [  ]; hematuria[  ];   dysuria [  ];  nocturia[  ];  history of     obstruction [  ];                 Skin: rash, swelling[  ];, hair loss[  ];  peripheral edema[  ];  or itching[  ]; Musculosketetal: myalgias[  ];  joint swelling[  ];  joint erythema[  ];  joint pain[  ];  back pain[  ];  Heme/Lymph: bruising[  ];  bleeding[  ];  anemia[  ];  Neuro: TIA[  ];  headaches[  ];  stroke[  ];  vertigo[  ];  seizures[  ];   paresthesias[  ];  difficulty walking[  ];  Psych:depression[  ];  anxiety[  ];  Endocrine: diabetes[  ];  thyroid dysfunction[  ];  Immunizations: Flu [  ]; Pneumococcal[  ];  Other:  Physical Exam: BP 141/78  Pulse 66  Temp(Src) 97.8 F (36.6 C) (Oral)  Resp 18  Ht 5\' 11"  (1.803 m)  Wt 221 lb 6.4 oz (100.426 kg)  BMI 30.89 kg/m2  SpO2 94%  No change in exam   Diagnostic Studies & Laboratory data:     Recent Radiology Findings:   Dg Chest 2 View  08/29/2012  *RADIOLOGY REPORT*  Clinical Data: Precardiac surgery  CHEST - 2 VIEW  Comparison: 08/11/2012  Findings:  The heart size and mediastinal contours are within normal limits. There is no airspace consolidation identified.  Scar versus plate- like atelectasis is noted in the periphery of the left base.  The visualized skeletal structures are unremarkable.  IMPRESSION:  1.  Scar versus atelectasis at the left base.   Original Report Authenticated By: Signa Kell, M.D.       Recent Lab Findings: Lab Results  Component Value Date   WBC 8.2 08/29/2012   HGB 14.9 08/29/2012   HCT 44.4 08/29/2012   PLT 206 08/29/2012   GLUCOSE 138* 08/29/2012   ALT 24 08/29/2012   AST 29 08/29/2012   NA 138 08/29/2012   K 4.1 08/29/2012   CL 104 08/29/2012   CREATININE 1.07 08/29/2012   BUN 13 08/29/2012   CO2 22 08/29/2012   INR 0.98 08/29/2012   HGBA1C 6.6* 08/29/2012      Assessment / Plan:     Plan CABG today

## 2012-08-30 NOTE — Progress Notes (Signed)
  Echocardiogram Echocardiogram Transesophageal has been performed.  Adam Peck 08/30/2012, 12:21 PM

## 2012-08-30 NOTE — Preoperative (Signed)
Beta Blockers   Reason not to administer Beta Blockers:metoprolol @ 0611 hrs 08/30/12

## 2012-08-30 NOTE — OR Nursing (Signed)
2nd call to SICU at 1314

## 2012-08-30 NOTE — Anesthesia Preprocedure Evaluation (Addendum)
Anesthesia Evaluation  Patient identified by MRN, date of birth, ID band Patient awake    Reviewed: Allergy & Precautions, H&P , NPO status , Patient's Chart, lab work & pertinent test results, reviewed documented beta blocker date and time   Airway Mallampati: II TM Distance: >3 FB Neck ROM: full    Dental  (+) Edentulous Upper and Partial Lower   Pulmonary          Cardiovascular hypertension, Pt. on medications + CAD and + Cardiac Stents Rhythm:regular Rate:Normal     Neuro/Psych  Headaches, Anxiety    GI/Hepatic GERD-  ,  Endo/Other  diabetes, Well Controlled, Type 2, Oral Hypoglycemic AgentsHypothyroidism   Renal/GU Renal InsufficiencyRenal disease     Musculoskeletal   Abdominal   Peds  Hematology   Anesthesia Other Findings   Reproductive/Obstetrics                         Anesthesia Physical Anesthesia Plan  ASA: III  Anesthesia Plan: General   Post-op Pain Management:    Induction: Intravenous  Airway Management Planned: Oral ETT  Additional Equipment: Arterial line, CVP, PA Cath, Ultrasound Guidance Line Placement and TEE  Intra-op Plan:   Post-operative Plan: Post-operative intubation/ventilation  Informed Consent: I have reviewed the patients History and Physical, chart, labs and discussed the procedure including the risks, benefits and alternatives for the proposed anesthesia with the patient or authorized representative who has indicated his/her understanding and acceptance.   Dental advisory given  Plan Discussed with: CRNA, Anesthesiologist and Surgeon  Anesthesia Plan Comments:         Anesthesia Quick Evaluation

## 2012-08-30 NOTE — Progress Notes (Addendum)
Clinical Social Work Department CLINICAL SOCIAL WORK PLACEMENT NOTE 08/30/2012  Patient:  Adam Peck, Adam Peck  Account Number:  [id number] Admit date:  08/30/2012  Clinical Social Worker:  Salomon Fick, LCSW  Date/time:  08/30/2012 04:40 PM  Clinical Social Work is seeking post-discharge placement for this patient at the following level of care:   SKILLED NURSING   (*CSW will update this form in Epic as items are completed)   08/30/2012  Patient/family provided with [facility] Department of Clinical Social Work's list of facilities offering this level of care within the geographic area requested by the patient (or if unable, by the patient's family).  08/30/2012  Patient/family informed of their freedom to choose among providers that offer the needed level of care, that participate in Medicare, Medicaid or managed care program needed by the patient, have an available bed and are willing to accept the patient.  08/30/2012  Patient/family informed of [facility]' ownership interest in [facility], as well as of the fact that they are under no obligation to receive care at this facility.  PASARR submitted to EDS on  PASARR number received from EDS on   FL2 transmitted to all facilities in geographic area requested by pt/family on  08/30/2012 FL2 transmitted to all facilities within larger geographic area on 08/30/2012  Patient informed that his/her managed care company has contracts with or will negotiate with  certain facilities, including the following:     Patient/family informed of bed offers received:  09/01/2012  Patient chooses bed at  [facility] Physician recommends and patient chooses bed at    Patient to be transferred to [facility] on   09/07/2012 Patient to be transferred to facility by  Brother - family car  The following physician request were entered in Epic:   Additional Comments:

## 2012-08-30 NOTE — Progress Notes (Signed)
°  Clinical Social Work Department BRIEF PSYCHOSOCIAL ASSESSMENT 08/30/2012  Patient:  Adam Peck, Adam Peck     Account Number:  [id number]     Admit date:  08/30/2012  Clinical Social Worker:  Madaline Guthrie  Date/Time:  08/30/2012 04:45 PM  Referred by:  Care Management  Date Referred:  08/30/2012 Referred for  SNF Placement   Other Referral:   Interview type:  Family Other interview type:    PSYCHOSOCIAL DATA Living Status:  ALONE Admitted from facility:   Level of care:   Primary support name:  Adam Peck Primary support relationship to patient:  SIBLING Degree of support available:   speaking with admissions coordinator at [facility] for pts placement  helps out with brother when he can.    CURRENT CONCERNS Current Concerns  None Noted   Other Concerns:    SOCIAL WORK ASSESSMENT / PLAN CSW intern attempted to speak with patient but was unable to due to vent. CSW intern contacted patient's brother who stated that patient's first choice SNF would be [facility]. Patient's brother states that prior to patient's hospitalization he was living at home alone and very independent. Pt understands that he will need to go to SNF before returning home. Patient's brother Adam Peck) [phone]) has been in contact with [name] at [facility] and stated that she will be looking for patients information for bed offer. CSW intern completed FL2 and faxed out to [facility]   Assessment/plan status:  No Further Intervention Required Other assessment/ plan:   Information/referral to community resources:    PATIENTS/FAMILYS RESPONSE TO PLAN OF CARE: Family and patient understand that SNF will be needed before discharging home. Pt wants [facility] as first choice.

## 2012-08-30 NOTE — Brief Op Note (Signed)
08/30/2012  11:43 AM  PATIENT:  Adam Peck  79 y.o. male  PRE-OPERATIVE DIAGNOSIS:  CAD  POST-OPERATIVE DIAGNOSIS:  CAD  PROCEDURE:  Procedure(s): CORONARY ARTERY BYPASS GRAFTING x4 -LIMA to LAD -SVG Ramus Intermediate -SVG to OM1 -SVG to Distal RCA  ENDOSCOPIC SAPHENOUS VEIN HARVEST RIGHT LEG  INTRAOPERATIVE TRANSESOPHAGEAL ECHOCARDIOGRAM (N/A)  SURGEON:  Surgeon(s) and Role:    * Kerin Perna, MD - Primary  PHYSICIAN ASSISTANT: Lowella Dandy PA-C   ANESTHESIA:   general  EBL:  Total I/O In: 1070 [I.V.:1000; IV Piggyback:70] Out: 225 [Urine:225]  BLOOD ADMINISTERED: CELLSAVER  DRAINS: Left Pleural Chest tube, Mediastinal Chest drains   LOCAL MEDICATIONS USED:  NONE  SPECIMEN:  No Specimen  DISPOSITION OF SPECIMEN:  N/A  COUNTS:  YES  TOURNIQUET:  * No tourniquets in log *  DICTATION: .Dragon Dictation  PLAN OF CARE: Admit to inpatient   PATIENT DISPOSITION:  ICU - intubated and hemodynamically stable.   Delay start of Pharmacological VTE agent (>24hrs) due to surgical blood loss or risk of bleeding: yes

## 2012-08-30 NOTE — Progress Notes (Signed)
08/30/12  Extubated patient with Sabino Gasser. Placed patient on  2L [place]. Pt is stable RT will continue to monitor.

## 2012-08-30 NOTE — Care Management Note (Signed)
    Page 1 of 1   09/07/2012     3:42:00 PM   CARE MANAGEMENT NOTE 09/07/2012  Patient:  Adam Peck, Adam Peck   Account Number:  [id number]  Date Initiated:  08/30/2012  Documentation initiated by:  Avie Arenas  Subjective/Objective Assessment:   Post op CABG x4     Action/Plan:   Anticipated DC Date:  09/05/2012   Anticipated DC Plan:  SKILLED NURSING FACILITY  In-house referral  Clinical Social Worker      DC Planning Services  CM consult      Choice offered to / List presented to:             Status of service:  Completed, signed off Medicare Important Message given?   (If response is "NO", the following Medicare IM given date fields will be blank) Date Medicare IM given:   Date Additional Medicare IM given:    Discharge Disposition:  SKILLED NURSING FACILITY  Per UR Regulation:  Reviewed for med. necessity/level of care/duration of stay  If discussed at Long Length of Stay Meetings, dates discussed:    Comments:  ContactBryna Colander     [phone]                Galas,Robert Brother [phone]  09/07/12 Kaliopi Blyden,RN,BSN [phone] PT FOR DC TO [facility] SN[facility] TODAY, PER CSW ARRANGEMENTS. BROTHER TO TRANSPORT.  09/06/12 Elma Limas,RN,BSN [phone] CSW CONT TO FOLLOWING FOR SNF PLACEMENT WHEN STABLE.  08-30-12 2:14 pm Avie Arenas, RNBSN [phone] Patient plans to go to SNF on discharge - prefers [facility]. SW consult placed, also left message.  Will need PT/OT consults when able.

## 2012-08-30 NOTE — Anesthesia Postprocedure Evaluation (Signed)
  Anesthesia Post-op Note  Patient: Adam Peck  Procedure(s) Performed: Procedure(s): CORONARY ARTERY BYPASS GRAFTING (CABG) (N/A) INTRAOPERATIVE TRANSESOPHAGEAL ECHOCARDIOGRAM (N/A)  Patient Location: SICU  Anesthesia Type:General  Level of Consciousness: sedated and Patient remains intubated per anesthesia plan  Airway and Oxygen Therapy: Patient remains intubated per anesthesia plan and Patient placed on Ventilator (see vital sign flow sheet for setting)  Post-op Pain: none  Post-op Assessment: Post-op Vital signs reviewed, Patient's Cardiovascular Status Stable, Respiratory Function Stable, Patent Airway, No signs of Nausea or vomiting and Pain level controlled  Post-op Vital Signs: stable  Complications: No apparent anesthesia complications

## 2012-08-31 ENCOUNTER — Inpatient Hospital Stay (HOSPITAL_COMMUNITY): Payer: Medicare Other

## 2012-08-31 ENCOUNTER — Encounter: Payer: Self-pay | Admitting: *Deleted

## 2012-08-31 ENCOUNTER — Encounter (HOSPITAL_COMMUNITY): Payer: Self-pay | Admitting: Cardiothoracic Surgery

## 2012-08-31 LAB — BASIC METABOLIC PANEL
BUN: 13 mg/dL (ref 6–23)
CO2: 25 mEq/L (ref 19–32)
Calcium: 8.3 mg/dL — ABNORMAL LOW (ref 8.4–10.5)
Chloride: 108 mEq/L (ref 96–112)
Creatinine, Ser: 1.05 mg/dL (ref 0.50–1.35)

## 2012-08-31 LAB — POCT I-STAT, CHEM 8
BUN: 12 mg/dL (ref 6–23)
BUN: 15 mg/dL (ref 6–23)
Calcium, Ion: 1.19 mmol/L (ref 1.13–1.30)
Calcium, Ion: 1.2 mmol/L (ref 1.13–1.30)
Chloride: 107 mEq/L (ref 96–112)
Chloride: 102 mEq/L (ref 96–112)
Creatinine, Ser: 1.1 mg/dL (ref 0.50–1.35)
Creatinine, Ser: 1.2 mg/dL (ref 0.50–1.35)
Glucose, Bld: 165 mg/dL — ABNORMAL HIGH (ref 70–99)
Glucose, Bld: 177 mg/dL — ABNORMAL HIGH (ref 70–99)
HCT: 34 % — ABNORMAL LOW (ref 39.0–52.0)
HCT: 36 % — ABNORMAL LOW (ref 39.0–52.0)
Hemoglobin: 11.6 g/dL — ABNORMAL LOW (ref 13.0–17.0)
Hemoglobin: 12.2 g/dL — ABNORMAL LOW (ref 13.0–17.0)
Potassium: 4.1 mEq/L (ref 3.5–5.1)
Potassium: 4.1 mEq/L (ref 3.5–5.1)
Sodium: 143 mEq/L (ref 135–145)
Sodium: 137 mEq/L (ref 135–145)
TCO2: 22 mmol/L (ref 0–100)
TCO2: 24 mmol/L (ref 0–100)

## 2012-08-31 LAB — GLUCOSE, CAPILLARY
Glucose-Capillary: 100 mg/dL — ABNORMAL HIGH (ref 70–99)
Glucose-Capillary: 101 mg/dL — ABNORMAL HIGH (ref 70–99)
Glucose-Capillary: 102 mg/dL — ABNORMAL HIGH (ref 70–99)
Glucose-Capillary: 103 mg/dL — ABNORMAL HIGH (ref 70–99)
Glucose-Capillary: 105 mg/dL — ABNORMAL HIGH (ref 70–99)
Glucose-Capillary: 106 mg/dL — ABNORMAL HIGH (ref 70–99)
Glucose-Capillary: 108 mg/dL — ABNORMAL HIGH (ref 70–99)
Glucose-Capillary: 108 mg/dL — ABNORMAL HIGH (ref 70–99)
Glucose-Capillary: 109 mg/dL — ABNORMAL HIGH (ref 70–99)
Glucose-Capillary: 113 mg/dL — ABNORMAL HIGH (ref 70–99)
Glucose-Capillary: 116 mg/dL — ABNORMAL HIGH (ref 70–99)
Glucose-Capillary: 118 mg/dL — ABNORMAL HIGH (ref 70–99)
Glucose-Capillary: 120 mg/dL — ABNORMAL HIGH (ref 70–99)
Glucose-Capillary: 122 mg/dL — ABNORMAL HIGH (ref 70–99)
Glucose-Capillary: 122 mg/dL — ABNORMAL HIGH (ref 70–99)
Glucose-Capillary: 127 mg/dL — ABNORMAL HIGH (ref 70–99)
Glucose-Capillary: 133 mg/dL — ABNORMAL HIGH (ref 70–99)
Glucose-Capillary: 140 mg/dL — ABNORMAL HIGH (ref 70–99)
Glucose-Capillary: 162 mg/dL — ABNORMAL HIGH (ref 70–99)
Glucose-Capillary: 168 mg/dL — ABNORMAL HIGH (ref 70–99)
Glucose-Capillary: 94 mg/dL (ref 70–99)
Glucose-Capillary: 95 mg/dL (ref 70–99)
Glucose-Capillary: 97 mg/dL (ref 70–99)

## 2012-08-31 LAB — PREPARE PLATELET PHERESIS: Unit division: 0

## 2012-08-31 LAB — CBC
HCT: 32.2 % — ABNORMAL LOW (ref 39.0–52.0)
HCT: 33.2 % — ABNORMAL LOW (ref 39.0–52.0)
Hemoglobin: 11.3 g/dL — ABNORMAL LOW (ref 13.0–17.0)
MCH: 27.8 pg (ref 26.0–34.0)
MCH: 28 pg (ref 26.0–34.0)
MCHC: 34 g/dL (ref 30.0–36.0)
MCV: 81.5 fL (ref 78.0–100.0)
MCV: 82.2 fL (ref 78.0–100.0)
RDW: 13.9 % (ref 11.5–15.5)
WBC: 15.7 10*3/uL — ABNORMAL HIGH (ref 4.0–10.5)

## 2012-08-31 LAB — TYPE AND SCREEN
ABO/RH(D): A POS
Antibody Screen: NEGATIVE

## 2012-08-31 MED ORDER — INSULIN DETEMIR 100 UNIT/ML ~~LOC~~ SOLN
15.0000 [IU] | Freq: Every day | SUBCUTANEOUS | Status: DC
  Administered 2012-08-31: 15 [IU] via SUBCUTANEOUS
  Filled 2012-08-31 (×2): qty 0.15

## 2012-08-31 MED ORDER — INSULIN ASPART 100 UNIT/ML ~~LOC~~ SOLN
0.0000 [IU] | SUBCUTANEOUS | Status: DC
  Administered 2012-08-31 (×3): 2 [IU] via SUBCUTANEOUS
  Administered 2012-08-31: 4 [IU] via SUBCUTANEOUS
  Administered 2012-09-01 – 2012-09-02 (×8): 2 [IU] via SUBCUTANEOUS

## 2012-08-31 MED ORDER — FUROSEMIDE 10 MG/ML IJ SOLN
40.0000 mg | Freq: Once | INTRAMUSCULAR | Status: AC
  Administered 2012-08-31: 40 mg via INTRAVENOUS

## 2012-08-31 MED ORDER — POTASSIUM CHLORIDE CRYS ER 20 MEQ PO TBCR
20.0000 meq | EXTENDED_RELEASE_TABLET | Freq: Once | ORAL | Status: AC
  Administered 2012-08-31: 20 meq via ORAL
  Filled 2012-08-31: qty 1

## 2012-08-31 NOTE — Progress Notes (Addendum)
                   [address]            [place] [address]          [phone]      1 Day Post-Op Procedure(s) (LRB): CORONARY ARTERY BYPASS GRAFTING (CABG) (N/A) INTRAOPERATIVE TRANSESOPHAGEAL ECHOCARDIOGRAM (N/A)  Subjective: Patient awake and alert this morning. Has some incisional pain  Objective: Vital signs in last 24 hours: Temp:  [97.3 F (36.3 C)-100 F (37.8 C)] 98.8 F (37.1 C) (04/23 0730) Pulse Rate:  [86-91] 87 (04/23 0730) Cardiac Rhythm:  [-] Atrial paced (04/23 0600) Resp:  [12-33] 18 (04/23 0730) BP: (76-140)/(38-81) 123/75 mmHg (04/23 0730) SpO2:  [91 %-100 %] 96 % (04/23 0730) Arterial Line BP: (89-157)/(55-80) 138/73 mmHg (04/23 0730) FiO2 (%):  [40 %-50 %] 40 % (04/22 1630) Weight:  [100.45 kg (221 lb 7.2 oz)-101.7 kg (224 lb 3.3 oz)] 101.7 kg (224 lb 3.3 oz) (04/23 0500)  Pre op weight  100 kg Current Weight  08/31/12 101.7 kg (224 lb 3.3 oz)    Hemodynamic parameters for last 24 hours: PAP: (22-48)/(13-28) 34/23 mmHg CO:  [4.7 L/min-6.3 L/min] 5.4 L/min CI:  [2.1 L/min/m2-2.9 L/min/m2] 2.5 L/min/m2  Intake/Output from previous day: 04/22 0701 - 04/23 0700 In: 5975.7 [I.V.:3604.7; Blood:801; IV Piggyback:1570] Out: 2961 [Urine:2405; Chest Tube:556]   Physical Exam:  Cardiovascular: RRR, rub with chest tubes in place Pulmonary: Diminished at bases; no rales, wheezes, or rhonchi. Abdomen: Soft, non tender, bowel sounds present. Extremities: Mild RLE edema;SCD on LLE Wounds: Dressings are clean and dry.  Neurologic: Grossly intact without focal deficits  Lab Results: CBC: Recent Labs  08/30/12 2000 08/30/12 2005 08/31/12 0335  WBC 16.3*  --  15.7*  HGB 11.0* 11.6* 11.0*  HCT 32.9* 34.0* 32.2*  PLT 154  --  131*   BMET:  Recent Labs  08/29/12 1153  08/30/12 2005 08/31/12 0335  NA 138  < > 143 139  K 4.1  < > 4.1 4.0  CL 104  --  107 108  CO2 22  --   --  25  GLUCOSE 138*  < > 165* 102*  BUN 13  --  12  13  CREATININE 1.07  < > 1.10 1.05  CALCIUM 9.6  --   --  8.3*  < > = values in this interval not displayed.  PT/INR:  Lab Results  Component Value Date   INR 1.31 08/30/2012   INR 0.98 08/29/2012   ABG:  INR: Will add last result for INR, ABG once components are confirmed Will add last 4 CBG results once components are confirmed  Assessment/Plan:  1. CV - A paced at 80. On Lopressor 12.5 bid. EKG this am shows underlying bradycardia with occasional PVCs. Will hold Lopressor this am. 2.  Pulmonary - Chest tubes with 556 cc of output since surgery.CXR this am appears to show small left pleural effusion, cardiomegaly, no pneumothorax. Encourage incentive spirometer 3. Volume Overload - Begin diuresis 4.  Acute blood loss anemia - H and H stable at 11 and 32.2 5.DM-CBGs 95/97/108.Pre op HGA1C 6.6. Start Levemir.Will consider restarting Metformin in morning if tolerating diet 6.Mild thrombocytopenia-platelets 131,000 7. Please see progression orders  ZIMMERMAN,DONIELLE MPA-C 08/31/2012,7:46 AM  Resume preop losartan at 25mg  .patient examined and medical record reviewed,agree with above note. VAN TRIGT III,PETER 08/31/2012

## 2012-08-31 NOTE — Op Note (Signed)
Adam Peck, Adam Peck NO.:  [id number]  MEDICAL RECORD NO.:  [id number]  LOCATION:  2310                         FACILITY:  [facility]  PHYSICIAN:  Kerin Perna, M.D.  DATE OF BIRTH:  Aug 13, 1933  DATE OF PROCEDURE:  08/30/2012 DATE OF DISCHARGE:                              OPERATIVE REPORT   OPERATION: 1. Coronary artery bypass grafting x4 (left internal mammary artery to     the left anterior descending artery, saphenous vein graft to ramus     intermediate, saphenous vein graft to obtuse marginal 1, saphenous     vein graft to posterior descending). 2. Endoscopic harvest of right leg greater saphenous vein.  SURGEON:  Kerin Perna, M.D.  ASSISTANT:  Pauline Good, PA-C.  PREOPERATIVE DIAGNOSIS:  Severe 3-vessel coronary artery disease with class 4 progressive angina.  POSTOPERATIVE DIAGNOSIS:  Severe 3-vessel coronary artery disease with class 4 progressive angina.  INDICATIONS:  The patient is a 79 year old Caucasian male, diabetic with previous PCI stents on chronic Plavix, who returns with recurrent accelerating angina and was found by catheterization to have severe progressive CAD with total occlusion of the RCA, high-grade stenosis of the LAD, and circumflex and a moderate stenosis of the ramus intermediate distal to the stent.  The EF was with mild inferior wall hypokinesia, and he was felt to be a candidate for surgical revascularization.  Prior to surgery, I examined the patient in the office and reviewed the results of the cardiac catheterization with the patient.  I discussed the indications and expected benefits of coronary artery bypass surgery for treatment of his coronary artery disease.  I reviewed the alternatives to surgical therapy as well.  I discussed with the patient the risks to him of the operation including risks of stroke, bleeding, infection, death, MI, and ventilator dependence.  He understood these issues and agreed to  proceed with surgery under what I felt was an informed consent.  OPERATIVE FINDINGS: 1. Suboptimal targets, adequate conduit. 2. No intraoperative blood transfusion required. 3. Stable hemodynamics and improved global LV function following     separation from cardiopulmonary bypass.  OPERATIVE PROCEDURE:  The patient was brought to the operating room and placed supine on the operating table.  General anesthesia was induced. A proper time-out was performed.  The chest, abdomen, and legs were prepped and draped as a sterile field.  A sternal incision was made as the saphenous vein was harvested endoscopically from the right leg.  The left internal mammary artery was harvested as a pedicle graft from its origin at the subclavian vessels.  It was 1.5-mm vessel with good flow. The sternal retractor was placed, and the pericardium was opened and suspended.  Pursestrings were placed in the ascending aorta and right atrium and after the vein had been harvested and inspected, heparin was administered, and the ACT was documented as being therapeutic.  The patient was cannulated and placed on cardiopulmonary bypass.  The coronary arteries were identified for grafting, and cardioplegia cannulas were placed for both antegrade and retrograde cold blood cardioplegia.  The patient was cooled to 32 degrees and aortic cross- clamp was applied.  One liter of cold  blood cardioplegia was delivered in split doses between the antegrade aortic and retrograde coronary sinus catheters.  There was good cardioplegic arrest and septal temperature dropped to less than 15 degrees.  Cardioplegia was delivered every 20 minutes while the crossclamp was in place.  The distal coronary anastomoses were performed.  The first distal anastomosis was the posterior descending branch of the right coronary. There was a long segment of total occlusion proximally.  The target was suboptimal, and the distal vessel was 1 mm.  A  reverse saphenous vein was sewn end-to-side with running 7-0 Prolene with good flow through the graft.  The second distal anastomosis was to the OM branch of the circumflex. It was a 1.5-mm vessel with proximal 90% stenosis.  A reverse saphenous vein was sewn end-to-side with running 7-0 Prolene with good flow through the graft.  Cardioplegia was redosed.  Third distal anastomosis was to the ramus intermediate branch of the left coronary.  It had a proximal stent.  There was moderate 50% stenosis beyond the stent.  A reverse saphenous vein was sewn end-to- side to this 1.3 mm vessel with good flow through the graft. Cardioplegia was redosed.  The fourth distal anastomosis was to the mid LAD.  The LAD was intramyocardial.  Proximally, it had a proximal 90% stenosis.  The left IMA pedicle was brought through an opening, created in the left lateral pericardium, was brought down onto the LAD and sewn end-to-side with running 8-0 Prolene.  There was excellent flow through the anastomosis after briefly releasing the pedicle bulldog on the mammary artery.  The bulldog was reapplied, and the pedicle was secured to the epicardium with 6-0 Prolene.  Cardioplegia was redosed.  While the crossclamp was still in place, 3 proximal vein anastomoses were performed on the ascending aorta with a 4.5 mm punch running 6-0 Prolene.  Prior to releasing the crossclamp, air was vented from the coronaries with a dose of retrograde warm blood cardioplegia.  The heart resumed a spontaneous rhythm.  The vein grafts were de-aired and checked and each had good flow and was hemostatic.  Temporary pacing wires were applied.  The patient was rewarmed to 37 degrees.  The lungs were re-expanded.  The ventilator was resumed.  The patient was weaned off bypass without difficulty, and the 2D echo showed improved global LV function.  Protamine was administered without adverse reaction.  The cannulas were removed.  The  mediastinum was irrigated with warm saline.  The superior pericardial fat was closed over the aorta.  The anterior mediastinal and left pleural chest tube were placed and brought out through separate incisions.  The sternum was closed with interrupted steel wire.  The pectoralis fascia was closed with interrupted #1 Vicryl.  The subcutaneous and skin layers were closed in running Vicryl.  Sterile dressings were applied.  Total cardiopulmonary bypass time was 130 minutes.    Kerin Perna, M.D.    PV/MEDQ  D:  08/30/2012  T:  08/31/2012  Job:  [id number]  cc:   Carolanne Mercier M. [place], M.D.

## 2012-08-31 NOTE — Progress Notes (Signed)
POD # 1 CABG  Up in chair  No complaints  BP 108/63  Pulse 81  Temp(Src) 97.8 F (36.6 C) (Oral)  Resp 22  Ht 5\' 11"  (1.803 m)  Wt 224 lb 3.3 oz (101.7 kg)  BMI 31.28 kg/m2  SpO2 94%   Intake/Output Summary (Last 24 hours) at 08/31/12 1718 Last data filed at 08/31/12 1700  Gross per 24 hour  Intake   1882 ml  Output   2590 ml  Net   -708 ml   Creatinine 1.28 Hct 33 Platelets 136K- stable

## 2012-09-01 ENCOUNTER — Inpatient Hospital Stay (HOSPITAL_COMMUNITY): Payer: Medicare Other

## 2012-09-01 LAB — POCT I-STAT 3, ART BLOOD GAS (G3+)
Acid-base deficit: 4 mmol/L — ABNORMAL HIGH (ref 0.0–2.0)
Bicarbonate: 21.9 mEq/L (ref 20.0–24.0)
O2 Saturation: 95 %
TCO2: 23 mmol/L (ref 0–100)
pCO2 arterial: 40.1 mmHg (ref 35.0–45.0)
pH, Arterial: 7.345 — ABNORMAL LOW (ref 7.350–7.450)
pO2, Arterial: 80 mmHg (ref 80.0–100.0)

## 2012-09-01 LAB — BASIC METABOLIC PANEL
CO2: 27 mEq/L (ref 19–32)
Chloride: 99 mEq/L (ref 96–112)
Creatinine, Ser: 1.18 mg/dL (ref 0.50–1.35)
Potassium: 4.5 mEq/L (ref 3.5–5.1)

## 2012-09-01 LAB — GLUCOSE, CAPILLARY
Glucose-Capillary: 40 mg/dL — CL (ref 70–99)
Glucose-Capillary: 138 mg/dL — ABNORMAL HIGH (ref 70–99)
Glucose-Capillary: 142 mg/dL — ABNORMAL HIGH (ref 70–99)
Glucose-Capillary: 144 mg/dL — ABNORMAL HIGH (ref 70–99)
Glucose-Capillary: 142 mg/dL — ABNORMAL HIGH (ref 70–99)
Glucose-Capillary: 159 mg/dL — ABNORMAL HIGH (ref 70–99)

## 2012-09-01 LAB — CBC
HCT: 33 % — ABNORMAL LOW (ref 39.0–52.0)
Hemoglobin: 10.8 g/dL — ABNORMAL LOW (ref 13.0–17.0)
MCV: 83.1 fL (ref 78.0–100.0)
RBC: 3.97 MIL/uL — ABNORMAL LOW (ref 4.22–5.81)
RDW: 13.9 % (ref 11.5–15.5)
WBC: 18.9 10*3/uL — ABNORMAL HIGH (ref 4.0–10.5)

## 2012-09-01 MED ORDER — AMIODARONE HCL IN DEXTROSE 360-4.14 MG/200ML-% IV SOLN
60.0000 mg/h | INTRAVENOUS | Status: AC
  Administered 2012-09-01: 60 mg/h via INTRAVENOUS
  Filled 2012-09-01: qty 200

## 2012-09-01 MED ORDER — FUROSEMIDE 40 MG PO TABS
40.0000 mg | ORAL_TABLET | Freq: Every day | ORAL | Status: DC
  Administered 2012-09-01 – 2012-09-02 (×2): 40 mg via ORAL
  Filled 2012-09-01 (×3): qty 1

## 2012-09-01 MED ORDER — TRAMADOL HCL 50 MG PO TABS
50.0000 mg | ORAL_TABLET | Freq: Four times a day (QID) | ORAL | Status: DC
  Administered 2012-09-01 – 2012-09-02 (×5): 50 mg via ORAL
  Filled 2012-09-01 (×4): qty 1

## 2012-09-01 MED ORDER — AMIODARONE HCL IN DEXTROSE 360-4.14 MG/200ML-% IV SOLN
30.0000 mg/h | INTRAVENOUS | Status: DC
  Administered 2012-09-01 – 2012-09-02 (×2): 30 mg/h via INTRAVENOUS
  Filled 2012-09-01 (×5): qty 200

## 2012-09-01 MED ORDER — OXYCODONE HCL 5 MG PO TABS: 5.0000 mg | ORAL_TABLET | ORAL | Status: DC | PRN

## 2012-09-01 MED ORDER — TRAMADOL HCL 50 MG PO TABS
50.0000 mg | ORAL_TABLET | Freq: Four times a day (QID) | ORAL | Status: DC | PRN
  Filled 2012-09-01: qty 1

## 2012-09-01 MED ORDER — OXYCODONE HCL 5 MG PO TABS
5.0000 mg | ORAL_TABLET | Freq: Four times a day (QID) | ORAL | Status: DC | PRN
  Administered 2012-09-02: 5 mg via ORAL
  Filled 2012-09-01: qty 1

## 2012-09-01 MED ORDER — LOSARTAN POTASSIUM 25 MG PO TABS
25.0000 mg | ORAL_TABLET | Freq: Every day | ORAL | Status: DC
  Administered 2012-09-02: 25 mg via ORAL
  Filled 2012-09-01 (×2): qty 1

## 2012-09-01 MED ORDER — FUROSEMIDE 10 MG/ML IJ SOLN
20.0000 mg | Freq: Once | INTRAMUSCULAR | Status: AC
  Administered 2012-09-01: 20 mg via INTRAVENOUS

## 2012-09-01 MED ORDER — ENOXAPARIN SODIUM 30 MG/0.3ML ~~LOC~~ SOLN
30.0000 mg | SUBCUTANEOUS | Status: DC
  Administered 2012-09-01 – 2012-09-05 (×5): 30 mg via SUBCUTANEOUS
  Filled 2012-09-01 (×6): qty 0.3

## 2012-09-01 MED ORDER — METFORMIN HCL 500 MG PO TABS
500.0000 mg | ORAL_TABLET | Freq: Two times a day (BID) | ORAL | Status: DC
  Administered 2012-09-01 – 2012-09-07 (×13): 500 mg via ORAL
  Filled 2012-09-01 (×16): qty 1

## 2012-09-01 MED FILL — Potassium Chloride Inj 2 mEq/ML: INTRAVENOUS | Qty: 40 | Status: AC

## 2012-09-01 MED FILL — Magnesium Sulfate Inj 50%: INTRAMUSCULAR | Qty: 2 | Status: AC

## 2012-09-01 NOTE — Progress Notes (Addendum)
                   [address]            [place] [address]          [phone]      2 Days Post-Op Procedure(s) (LRB): CORONARY ARTERY BYPASS GRAFTING (CABG) (N/A) INTRAOPERATIVE TRANSESOPHAGEAL ECHOCARDIOGRAM (N/A)  Subjective: Patient was anxious and agitated last evening. Give Xanax for anxiety. Has occasional hallucinations  Objective: Vital signs in last 24 hours: Temp:  [97.4 F (36.3 C)-99 F (37.2 C)] 97.5 F (36.4 C) (04/24 0418) Pulse Rate:  [37-123] 109 (04/24 0715) Cardiac Rhythm:  [-] Ventricular paced (04/24 0400) Resp:  [19-27] 24 (04/24 0715) BP: (90-141)/(60-94) 108/71 mmHg (04/24 0700) SpO2:  [90 %-100 %] 93 % (04/24 0715) Arterial Line BP: (116-149)/(62-71) 149/65 mmHg (04/23 0900) Weight:  [102.8 kg (226 lb 10.1 oz)] 102.8 kg (226 lb 10.1 oz) (04/24 0500)  Pre op weight  100 kg Current Weight  09/01/12 102.8 kg (226 lb 10.1 oz)    Hemodynamic parameters for last 24 hours: PAP: (28-32)/(15-17) 31/16 mmHg CO:  [6.4 L/min] 6.4 L/min CI:  [2.9 L/min/m2] 2.9 L/min/m2  Intake/Output from previous day: 04/23 0701 - 04/24 0700 In: 1523.7 [P.O.:960; I.V.:513.7; IV Piggyback:50] Out: 1370 [Urine:1210; Chest Tube:160]   Physical Exam:  Cardiovascular: [facility] Pulmonary: Diminished at bases; no rales, wheezes, or rhonchi. Abdomen: Soft, non tender, bowel sounds present. Extremities: Mild RLE edema;SCD on LLE Wounds: Dressings are clean and dry.  Neurologic: Grossly intact without focal deficits  Lab Results: CBC:  Recent Labs  08/31/12 1625 09/01/12 0420  WBC 16.0* 18.9*  HGB 11.3* 10.8*  HCT 33.2* 33.0*  PLT 136* 118*   BMET:   Recent Labs  08/31/12 0335 08/31/12 1622 08/31/12 1625 09/01/12 0420  NA 139 137  --  134*  K 4.0 4.1  --  4.5  CL 108 102  --  99  CO2 25  --   --  27  GLUCOSE 102* 177*  --  135*  BUN 13 15  --  18  CREATININE 1.05 1.20 1.28 1.18  CALCIUM 8.3*  --   --  8.4    PT/INR:  Lab  Results  Component Value Date   INR 1.31 08/30/2012   INR 0.98 08/29/2012   ABG:  INR: Will add last result for INR, ABG once components are confirmed Will add last 4 CBG results once components are confirmed  Assessment/Plan:  1. CV - VVI back up at 70. Has had brief transient a fib with mostly controlled ventricular rate.On Lopressor 12.5 bid. Will start Amiodarone 2.  Pulmonary - Chest tubes with 160 cc of output last 24 hours.CXR this am appears to show patient rotated to thte left,small left pleural effusion, cardiomegaly, no pneumothorax. Remove chest tube.Encourage incentive spirometer 3. Volume Overload - Lasix 40 daily 4.  Acute blood loss anemia - H and H stable at 10.8 and 33 5.DM-CBGs 140/133/138.Pre op HGA1C 6.6. Restart Metformin  6.Mild thrombocytopenia-platelets 134,000 7. Regarding intermittent hallucinations, will try Ultram for pain as he is receiving Oxy every 3 hours 8.Remove foley 9.Remain in ICU for today  Tamu Golz MPA-C 09/01/2012,7:36 AM

## 2012-09-01 NOTE — Progress Notes (Signed)
Clinical Social Work: English as a second language teacher received bed offers for pt and notified patient that his first choice Adam Peck was a yes. CSW intern left a message for patient's brother Adam Peck notifying him of bed offer. CSW intern contacted [name] at [facility] [phone]) Ex [phone] and she stated pt would have bed opening at least until Monday. If there are any questions or patient d/c early she asked that we contact [name] [phone] Ex [phone] otherwise she will be back in her office Monday morning.

## 2012-09-01 NOTE — Progress Notes (Signed)
T. CTS p.m. Rounds  Continues in atrial fibrillation heart rate 101-110 Walked in the hallway Blood pressure and notch and saturation stable Has residual edema we'll repeat dose of IV Lasix this p.m.

## 2012-09-02 ENCOUNTER — Inpatient Hospital Stay (HOSPITAL_COMMUNITY): Payer: Medicare Other

## 2012-09-02 LAB — BASIC METABOLIC PANEL WITH GFR
BUN: 28 mg/dL — ABNORMAL HIGH (ref 6–23)
CO2: 27 meq/L (ref 19–32)
Calcium: 8.3 mg/dL — ABNORMAL LOW (ref 8.4–10.5)
Chloride: 96 meq/L (ref 96–112)
Creatinine, Ser: 1.37 mg/dL — ABNORMAL HIGH (ref 0.50–1.35)
GFR calc Af Amer: 56 mL/min — ABNORMAL LOW
GFR calc non Af Amer: 49 mL/min — ABNORMAL LOW
Glucose, Bld: 132 mg/dL — ABNORMAL HIGH (ref 70–99)
Potassium: 4.3 meq/L (ref 3.5–5.1)
Sodium: 131 meq/L — ABNORMAL LOW (ref 135–145)

## 2012-09-02 LAB — GLUCOSE, CAPILLARY
Glucose-Capillary: 118 mg/dL — ABNORMAL HIGH (ref 70–99)
Glucose-Capillary: 125 mg/dL — ABNORMAL HIGH (ref 70–99)
Glucose-Capillary: 131 mg/dL — ABNORMAL HIGH (ref 70–99)
Glucose-Capillary: 133 mg/dL — ABNORMAL HIGH (ref 70–99)
Glucose-Capillary: 148 mg/dL — ABNORMAL HIGH (ref 70–99)

## 2012-09-02 LAB — CBC
HCT: 31.2 % — ABNORMAL LOW (ref 39.0–52.0)
Hemoglobin: 10.6 g/dL — ABNORMAL LOW (ref 13.0–17.0)
MCH: 28 pg (ref 26.0–34.0)
MCHC: 34 g/dL (ref 30.0–36.0)
MCV: 82.3 fL (ref 78.0–100.0)
Platelets: 120 K/uL — ABNORMAL LOW (ref 150–400)
RBC: 3.79 MIL/uL — ABNORMAL LOW (ref 4.22–5.81)
RDW: 14.2 % (ref 11.5–15.5)
WBC: 13 K/uL — ABNORMAL HIGH (ref 4.0–10.5)

## 2012-09-02 MED ORDER — AMIODARONE HCL 200 MG PO TABS
200.0000 mg | ORAL_TABLET | Freq: Two times a day (BID) | ORAL | Status: DC
  Administered 2012-09-02 – 2012-09-07 (×11): 200 mg via ORAL
  Filled 2012-09-02 (×12): qty 1

## 2012-09-02 MED ORDER — LOSARTAN POTASSIUM 25 MG PO TABS
25.0000 mg | ORAL_TABLET | Freq: Every day | ORAL | Status: DC
  Administered 2012-09-03 – 2012-09-07 (×5): 25 mg via ORAL
  Filled 2012-09-02 (×6): qty 1

## 2012-09-02 MED ORDER — SODIUM CHLORIDE 0.9 % IJ SOLN
3.0000 mL | Freq: Two times a day (BID) | INTRAMUSCULAR | Status: DC
  Administered 2012-09-02 – 2012-09-06 (×10): 3 mL via INTRAVENOUS

## 2012-09-02 MED ORDER — TRAMADOL HCL 50 MG PO TABS
50.0000 mg | ORAL_TABLET | ORAL | Status: DC | PRN
  Administered 2012-09-03: 100 mg via ORAL
  Filled 2012-09-02: qty 2

## 2012-09-02 MED ORDER — MOVING RIGHT ALONG BOOK
Freq: Once | Status: AC
  Administered 2012-09-02: 09:00:00
  Filled 2012-09-02: qty 1

## 2012-09-02 MED ORDER — INSULIN ASPART 100 UNIT/ML ~~LOC~~ SOLN
0.0000 [IU] | Freq: Four times a day (QID) | SUBCUTANEOUS | Status: DC
  Administered 2012-09-02: 2 [IU] via SUBCUTANEOUS

## 2012-09-02 MED ORDER — SODIUM CHLORIDE 0.9 % IV SOLN: 250.0000 mL | INTRAVENOUS | Status: DC | PRN

## 2012-09-02 MED ORDER — SODIUM CHLORIDE 0.9 % IJ SOLN: 3.0000 mL | INTRAMUSCULAR | Status: DC | PRN

## 2012-09-02 MED FILL — Heparin Sodium (Porcine) Inj 1000 Unit/ML: INTRAMUSCULAR | Qty: 30 | Status: AC

## 2012-09-02 MED FILL — Mannitol IV Soln 20%: INTRAVENOUS | Qty: 500 | Status: AC

## 2012-09-02 MED FILL — Sodium Chloride Irrigation Soln 0.9%: Qty: 3000 | Status: AC

## 2012-09-02 MED FILL — Sodium Chloride IV Soln 0.9%: INTRAVENOUS | Qty: 1000 | Status: AC

## 2012-09-02 MED FILL — Sodium Bicarbonate IV Soln 8.4%: INTRAVENOUS | Qty: 50 | Status: AC

## 2012-09-02 MED FILL — Heparin Sodium (Porcine) Inj 1000 Unit/ML: INTRAMUSCULAR | Qty: 20 | Status: AC

## 2012-09-02 MED FILL — Electrolyte-R (PH 7.4) Solution: INTRAVENOUS | Qty: 4000 | Status: AC

## 2012-09-02 NOTE — Evaluation (Signed)
Physical Therapy Evaluation Patient Details Name: Adam Peck MRN: [id number] DOB: [birth year] Today's Date: 09/02/2012 Time: 4098-1191 PT Time Calculation (min): 22 min  PT Assessment / Plan / Recommendation Clinical Impression  Pt is a 79 y.o. male s/p CABG. Pt demonstrates deficits in functional mobility as indicated. will continue to see acutely. Recommend SNF upon discharge.    PT Assessment  Patient needs continued PT services    Follow Up Recommendations  SNF    Does the patient have the potential to tolerate intense rehabilitation      Barriers to Discharge Decreased caregiver support      Equipment Recommendations  None recommended by PT    Recommendations for Other Services     Frequency Min 2X/week    Precautions / Restrictions Precautions Precautions: Sternal Restrictions Weight Bearing Restrictions: Yes (midsternal)   Pertinent Vitals/Pain 4/10; BP 121/83       Mobility  Bed Mobility Bed Mobility: Sit to Supine Sit to Supine: 1: +2 Total assist;HOB flat Sit to Supine: Patient Percentage: 30% Details for Bed Mobility Assistance: Assist to support trunk/LEs into bed. Transfers Transfers: Sit to Stand;Stand to Sit Sit to Stand: 3: Mod assist;From chair/3-in-1;Without upper extremity assist Stand to Sit: 4: Min assist;To bed;Without upper extremity assist Details for Transfer Assistance: Assist for power up from chair. Cueing to avoid use of UEs in order to maintain sternal precautions. Ambulation/Gait Ambulation/Gait Assistance: 4: Min assist Ambulation Distance (Feet): 12 Feet Assistive device: Rolling walker Ambulation/Gait Assistance Details: Pt requires assist secondary to nausea and poor activity tolerance at this time Gait Pattern: Step-through pattern;Shuffle Gait velocity: decreased        PT Diagnosis: Difficulty walking;Generalized weakness;Acute pain  PT Problem List: Decreased strength;Decreased range of motion;Decreased activity  tolerance;Decreased balance;Decreased mobility PT Treatment Interventions: DME instruction;Gait training;Stair training;Functional mobility training;Therapeutic activities;Therapeutic exercise;Patient/family education   PT Goals Acute Rehab PT Goals PT Goal Formulation: With patient Time For Goal Achievement: 09/16/12 Potential to Achieve Goals: Good Pt will go Supine/Side to Sit: Independently PT Goal: Supine/Side to Sit - Progress: Goal set today Pt will go Sit to Supine/Side: Independently PT Goal: Sit to Supine/Side - Progress: Goal set today Pt will go Sit to Stand: with modified independence PT Goal: Sit to Stand - Progress: Goal set today Pt will go Stand to Sit: with modified independence PT Goal: Stand to Sit - Progress: Goal set today Pt will Ambulate: >150 feet;with modified independence PT Goal: Ambulate - Progress: Goal set today  Visit Information  Last PT Received On: 09/02/12 Assistance Needed: +1    Subjective Data  Subjective: I feel sick Patient Stated Goal: get better   Prior Functioning  Home Living Lives With: Alone Available Help at Discharge: Skilled Nursing Facility Prior Function Level of Independence: Independent Able to Take Stairs?: Yes Driving: Yes    Cognition  Cognition Arousal/Alertness: Awake/alert Behavior During Therapy: WFL for tasks assessed/performed Overall Cognitive Status: Within Functional Limits for tasks assessed    Extremity/Trunk Assessment Right Upper Extremity Assessment RUE ROM/Strength/Tone: [facility] for tasks assessed;Unable to fully assess;Due to precautions (sternal precautions) Left Upper Extremity Assessment LUE ROM/Strength/Tone: [facility] for tasks assessed;Unable to fully assess;Due to precautions (sternal) Right Lower Extremity Assessment RLE ROM/Strength/Tone: [facility] for tasks assessed Left Lower Extremity Assessment LLE ROM/Strength/Tone: [facility] for tasks assessed   Balance Balance Balance Assessed: Yes Static Sitting  Balance Static Sitting - Balance Support: Feet supported;No upper extremity supported Static Sitting - Level of Assistance: 4: Min assist Static Sitting - Comment/# of  Minutes: Min assist to prevent posterior lean.  Pt hugging pillow and unable to maintain forward balance sitting EOB. Static Standing Balance Static Standing - Balance Support: Bilateral upper extremity supported Static Standing - Level of Assistance: 4: Min assist Static Standing - Comment/# of Minutes: Bil UEs supported on RW.  End of Session PT - End of Session Equipment Utilized During Treatment: Gait belt;Oxygen Activity Tolerance: Patient limited by fatigue;Patient limited by pain (nausea) Patient left: in bed;with call bell/phone within reach;with nursing in room Nurse Communication: Mobility status;Other (comment) (nauseated)  GP     Fabio Asa 09/02/2012, 10:23 AM Charlotte Crumb, PT DPT  [phone]

## 2012-09-02 NOTE — Progress Notes (Signed)
Pt transferred to 2015 RN received in room, pt on monitor and stable smiling.

## 2012-09-02 NOTE — Evaluation (Signed)
Occupational Therapy Evaluation Patient Details Name: Adam Peck MRN: [id number] DOB: Sep 09, 1933 Today's Date: 09/02/2012 Time: 5784-6962 OT Time Calculation (min): 22 min  OT Assessment / Plan / Recommendation Clinical Impression  Pt s/p CABG.  Will continue to follow acutely in order to address below problem list. Recommending SNF to further progress rehab before eventual return home.    OT Assessment  Patient needs continued OT Services    Follow Up Recommendations  SNF    Barriers to Discharge Decreased caregiver support    Equipment Recommendations  None recommended by OT    Recommendations for Other Services    Frequency  Min 2X/week    Precautions / Restrictions Precautions Precautions: Sternal Restrictions Weight Bearing Restrictions: Yes (midsternal)   Pertinent Vitals/Pain See vitals    ADL  Upper Body Bathing: Simulated;Minimal assistance Where Assessed - Upper Body Bathing: Supported sitting Lower Body Bathing: Simulated;Maximal assistance Where Assessed - Lower Body Bathing: Supported sit to stand Upper Body Dressing: Simulated;Minimal assistance Where Assessed - Upper Body Dressing: Supported sitting Lower Body Dressing: Simulated;+1 Total assistance Where Assessed - Lower Body Dressing: Supported sit to Pharmacist, hospital: Simulated;Moderate assistance Toilet Transfer Method: Sit to stand Toilet Transfer Equipment:  (chair ambulating to bed) Equipment Used: Rolling walker (4L O2) Transfers/Ambulation Related to ADLs: min assist with RW ADL Comments: limited due to nausea.      OT Diagnosis: Generalized weakness;Acute pain  OT Problem List: Decreased strength;Decreased activity tolerance;Impaired balance (sitting and/or standing);Decreased knowledge of use of DME or AE;Decreased knowledge of precautions;Pain OT Treatment Interventions: Self-care/ADL training;DME and/or AE instruction;Therapeutic activities;Patient/family education;Balance training    OT Goals Acute Rehab OT Goals OT Goal Formulation: With patient Time For Goal Achievement: 09/16/12 Potential to Achieve Goals: Good ADL Goals Pt Will Perform Grooming: with supervision;Unsupported;Standing at sink ADL Goal: Grooming - Progress: Goal set today Pt Will Transfer to Toilet: with supervision;Ambulation;with DME;Comfort height toilet ADL Goal: Toilet Transfer - Progress: Goal set today Miscellaneous OT Goals Miscellaneous OT Goal #1: Pt will perform bed mobility with min assist as precursor for EOB ADLs. OT Goal: Miscellaneous Goal #1 - Progress: Goal set today Miscellaneous OT Goal #2: Pt will independently maintain sternal precautions during all functional mobility and ADLs. OT Goal: Miscellaneous Goal #2 - Progress: Goal set today  Visit Information  Last OT Received On: 09/02/12 Assistance Needed: +1 PT/OT Co-Evaluation/Treatment: Yes    Subjective Data      Prior Functioning     Home Living Lives With: Alone Available Help at Discharge: Skilled Nursing Facility Prior Function Level of Independence: Independent Able to Take Stairs?: Yes Driving: Yes         Vision/Perception     Cognition  Cognition Arousal/Alertness: Awake/alert Behavior During Therapy: WFL for tasks assessed/performed Overall Cognitive Status: Within Functional Limits for tasks assessed    Extremity/Trunk Assessment Right Upper Extremity Assessment RUE ROM/Strength/Tone: [facility] for tasks assessed;Unable to fully assess;Due to precautions (sternal precautions) Left Upper Extremity Assessment LUE ROM/Strength/Tone: [facility] for tasks assessed;Unable to fully assess;Due to precautions (sternal)     Mobility Bed Mobility Bed Mobility: Sit to Supine Sit to Supine: 1: +2 Total assist;HOB flat Sit to Supine: Patient Percentage: 30% Details for Bed Mobility Assistance: Assist to support trunk/LEs into bed. Transfers Transfers: Sit to Stand;Stand to Sit Sit to Stand: 3: Mod  assist;From chair/3-in-1;Without upper extremity assist Stand to Sit: 4: Min assist;To bed;Without upper extremity assist Details for Transfer Assistance: Assist for power up from chair. Cueing to avoid use  of UEs in order to maintain sternal precautions.     Exercise     Balance Balance Balance Assessed: Yes Static Sitting Balance Static Sitting - Balance Support: Feet supported;No upper extremity supported Static Sitting - Level of Assistance: 4: Min assist Static Sitting - Comment/# of Minutes: Min assist to prevent posterior lean.  Pt hugging pillow and unable to maintain forward balance sitting EOB. Static Standing Balance Static Standing - Balance Support: Bilateral upper extremity supported Static Standing - Level of Assistance: 4: Min assist Static Standing - Comment/# of Minutes: Bil UEs supported on RW.   End of Session OT - End of Session Equipment Utilized During Treatment:  (RW) Activity Tolerance: Other (comment) (limited by nausea) Patient left: in bed;with call bell/phone within reach Nurse Communication: Mobility status (nausea)  GO    09/02/2012 Cipriano Mile OTR/L Pager [phone] Office [phone]   Cipriano Mile 09/02/2012, 9:45 AM

## 2012-09-02 NOTE — Progress Notes (Signed)
                   [address]            Adam Peck [address]          [phone]      3 Days Post-Op Procedure(s) (LRB): CORONARY ARTERY BYPASS GRAFTING (CABG) (N/A) INTRAOPERATIVE TRANSESOPHAGEAL ECHOCARDIOGRAM (N/A)  Subjective: Patient sitting in chair. Had a fairly good night. Starting to pass flatus.  Objective: Vital signs in last 24 hours: Temp:  [97.6 F (36.4 C)-98.1 F (36.7 C)] 98.1 F (36.7 C) (04/25 0400) Pulse Rate:  [32-116] 70 (04/25 0700) Cardiac Rhythm:  [-] Normal sinus rhythm (04/25 0700) Resp:  [19-29] 23 (04/25 0700) BP: (80-127)/(55-86) 125/86 mmHg (04/25 0700) SpO2:  [92 %-95 %] 95 % (04/25 0700) Weight:  [103.511 kg (228 lb 3.2 oz)] 103.511 kg (228 lb 3.2 oz) (04/25 0500)  Pre op weight  100 kg Current Weight  09/02/12 103.511 kg (228 lb 3.2 oz)      Intake/Output from previous day: 04/24 0701 - 04/25 0700 In: 1472 [P.O.:665; I.V.:807] Out: 760 [Urine:760]   Physical Exam:  Cardiovascular: RRR Pulmonary: Diminished at left base; right lung is clear;no rales, wheezes, or rhonchi. Abdomen: Soft, non tender, some distention,bowel sounds present. Extremities: Mild lower extremity edema Wounds: Sternal dressing is clean and dry. Right lower extremity wound is dry, no sign of infection.  Lab Results: CBC:  Recent Labs  09/01/12 0420 09/02/12 0417  WBC 18.9* 13.0*  HGB 10.8* 10.6*  HCT 33.0* 31.2*  PLT 118* 120*   BMET:   Recent Labs  09/01/12 0420 09/02/12 0417  NA 134* 131*  K 4.5 4.3  CL 99 96  CO2 27 27  GLUCOSE 135* 132*  BUN 18 28*  CREATININE 1.18 1.37*  CALCIUM 8.4 8.3*    PT/INR:  Lab Results  Component Value Date   INR 1.31 08/30/2012   INR 0.98 08/29/2012   ABG:  INR: Will add last result for INR, ABG once components are confirmed Will add last 4 CBG results once components are confirmed  Assessment/Plan:  1. CV - Afib with rate into 110's yesterday. SR in the 60's this am. On  Lopressor 12.5 bid, Losartan 25 daily, and Amiodarone gttp. Will stop Amiodarone gttp and start oral. Will give 200 bid as HR in the 60's. 2.  Pulmonary - CXR this am appears to show left pleural effusion and atelectasis, cardiomegaly, no pneumothorax. Encourage incentive spirometer and flutter valve.Wean oxygen (on 3 liters via [place]) as tolerates. 3. Volume Overload - Lasix 40 oral daily 4.  Acute blood loss anemia - H and H stable at 10.6 and 31.2 5.DM-CBGs 159/118/125.Pre op HGA1C 6.6.Continue Metformin  6.Mild thrombocytopenia-platelets 120,000 7.Creatinine slightly increased to 1.37. Was given IV lasix twice yesterday. Monitor 8.Possible transfer to PTCU  Annely Sliva MPA-C 09/02/2012,7:23 AM

## 2012-09-03 ENCOUNTER — Inpatient Hospital Stay (HOSPITAL_COMMUNITY): Payer: Medicare Other

## 2012-09-03 LAB — GLUCOSE, CAPILLARY
Glucose-Capillary: 103 mg/dL — ABNORMAL HIGH (ref 70–99)
Glucose-Capillary: 104 mg/dL — ABNORMAL HIGH (ref 70–99)
Glucose-Capillary: 112 mg/dL — ABNORMAL HIGH (ref 70–99)
Glucose-Capillary: 115 mg/dL — ABNORMAL HIGH (ref 70–99)
Glucose-Capillary: 125 mg/dL — ABNORMAL HIGH (ref 70–99)

## 2012-09-03 LAB — CBC
HCT: 30.4 % — ABNORMAL LOW (ref 39.0–52.0)
MCH: 27.8 pg (ref 26.0–34.0)
MCV: 82.2 fL (ref 78.0–100.0)
RBC: 3.7 MIL/uL — ABNORMAL LOW (ref 4.22–5.81)
WBC: 9.7 10*3/uL (ref 4.0–10.5)

## 2012-09-03 MED ORDER — FUROSEMIDE 10 MG/ML IJ SOLN
40.0000 mg | Freq: Once | INTRAMUSCULAR | Status: AC
  Administered 2012-09-03: 40 mg via INTRAVENOUS
  Filled 2012-09-03: qty 4

## 2012-09-03 MED ORDER — FUROSEMIDE 40 MG PO TABS
40.0000 mg | ORAL_TABLET | Freq: Every day | ORAL | Status: DC
  Administered 2012-09-04 – 2012-09-07 (×4): 40 mg via ORAL
  Filled 2012-09-03 (×4): qty 1

## 2012-09-03 MED ORDER — LACTULOSE 10 GM/15ML PO SOLN
20.0000 g | Freq: Once | ORAL | Status: AC
  Administered 2012-09-03: 20 g via ORAL
  Filled 2012-09-03: qty 30

## 2012-09-03 MED ORDER — INSULIN ASPART 100 UNIT/ML ~~LOC~~ SOLN
0.0000 [IU] | Freq: Three times a day (TID) | SUBCUTANEOUS | Status: DC
  Administered 2012-09-03 – 2012-09-06 (×2): 2 [IU] via SUBCUTANEOUS

## 2012-09-03 NOTE — Progress Notes (Signed)
CARDIAC REHAB PHASE I   PRE:  Rate/Rhythm: Sinus 60 occasional PVC  BP:  Supine: 118/60   SaO2: 94% 2l/min  MODE:  Ambulation: 90 ft   POST:  Rate/Rhythem: Sinus 60  BP:    Sitting: 114/80     SaO2: 95 2l/min 1540-1630 Patient walked a few feet in room complained of feeling lightheaded.  Patient placed in chair to rest a few minutes. Patient's RN aware in room to assist with ambulating patient. Patient also complained of feeling nauseated. Patient assisted to recliner with call bell within reach. Adam Peck used his incentive spirometer! Encouraged the patient to use his IS every 1-2 hours while awake. No further symptoms after walk completed.  Will follow up with patient on Monday.  Whitaker, Arta Bruce RN

## 2012-09-03 NOTE — Discharge Summary (Signed)
Physician Discharge Summary  Patient ID: Adam Peck MRN: [id number] DOB/AGE: 77/07/1933 77 y.o.  Admit date: 08/30/2012 Discharge date: 09/06/2012  Admission Diagnoses: 1.History of CAD (s/p PCI with DES stents to OM and RCA) 2.History of DM 3.History of dyslipidemia 4.History of essential,benin hypertension 5.History of tobacco abuse 6.History of hypothyroidism  Discharge Diagnoses:  1.History of CAD (s/p PCI with DES stents to OM and RCA) 2.History of DM 3.History of dyslipidemia 4.History of essential,benin hypertension 5.History of tobacco abuse 6.History of hypothyroidism 7.Mild ABL anemia   Procedure (s):  1. Coronary artery bypass grafting x4 (left internal mammary artery to the left anterior descending artery, saphenous vein graft to ramus intermediate, saphenous vein graft to obtuse marginal 1, saphenous vein graft to posterior descending).  2. Endoscopic harvest of right leg greater saphenous vein by Dr. Donata Clay on 08/30/2012.  History of Presenting Illness: This is a 79 year old Caucasian male, retired Visual merchandiser, with severe hypertension and fairly well controlled diabetes who presents with exertional dyspnea, decreased exercise tolerance, increasing fatigue, and some mild chest discomfort. In 2007 the patient had a PCI with stent to the RCA and OM. A myocardial perfusion test showed ischemic changes. The patient underwent coronary arteriography by Dr. [place] which demonstrated chronic occlusion of the RCA, high-grade stenosis of the LAD, high-grade stenosis of the circumflex and circumflex marginal with EF of 55%.  2-D echocardiogram demonstrated no significant valvular disease, moderate LVH with diastolic dysfunction, no pericardial effusion, and LVEF 55%. The patient denies any resting symptoms other than some occasional orthopnea. He was seen in consultation by Dr. Donata Clay for consideration of coronary artery bypass grafting surgery. Potential risks, benefits, and  complications were discussed with the patient and she agreed to proceed. Pre operative carotid duplex US showed no significant internal carotid artery stenosis. ABI's were >1 on the right and 0.99 on the left. He underwent a CABG x 4 on 08/30/2012.  Brief Hospital Course: The patient was extubated the evening of surgery without difficulty. He/she remained afebrile and hemodynamically stable. Theone Murdoch, a line, chest tubes, and foley were removed early in the post operative course. He was started on Lopressor.He was also started on Cozaar as his blood pressure was elevated.He was volume over loaded and diuresed. He went into atrial fibrillation with RVR on post operative day 2. He was started on an Amiodarone gttp. He converted to sinus rhythm and remained. He was weaned off the insulin drip. Once he was tolerating a diet, home diabetic medicines were restarted. The patient's HGA1C pre op was 6.6. The patient was felt surgically stable for transfer from the ICU to PCTU for further convalescence on 09/02/2012. He continues to progress with cardiac rehab. He was ambulating on a couple liters of oxygen via [place]. We will attempt to wean this. He may need home oxygen.He has been tolerating a diet and has had a bowel movement. Epicardial pacing wires and chest tube sutures will be removed prior to discharge. He has had several brief runs of atrial fibrillation, but generally is maintaining sinus rhythm.  He has been evaluated on today's date and is ready for transfer to SNF.   Latest Vital Signs: Blood pressure 115/69, pulse 112, temperature 99.1 F (37.3 C), temperature source Oral, resp. rate 19, height 5\' 11"  (1.803 m), weight 100.472 kg (221 lb 8 oz), SpO2 92.00%.  Physical Exam: Cardiovascular: RRR  Pulmonary: Diminished at left base; right lung is clear;no rales, wheezes, or rhonchi.  Abdomen: Soft, non tender, some distention,bowel  sounds present.  Extremities: Mild lower extremity edema  Wounds: Sternal  dressing is clean and dry. Right lower extremity wound is dry, no sign of infection.   Discharge Condition:Stable  Recent laboratory studies:  Lab Results  Component Value Date   WBC 9.7 09/03/2012   HGB 10.3* 09/03/2012   HCT 30.4* 09/03/2012   MCV 82.2 09/03/2012   PLT 162 09/03/2012   Lab Results  Component Value Date   NA 135 09/05/2012   K 3.8 09/05/2012   CL 98 09/05/2012   CO2 28 09/05/2012   CREATININE 1.30 09/05/2012   GLUCOSE 114* 09/05/2012      Diagnostic Studies: Dg Chest 2 View  09/03/2012  *RADIOLOGY REPORT*  Clinical Data: Post CABG  CHEST - 2 VIEW  Comparison: 09/02/2012; 08/29/2012  Findings: Grossly unchanged enlarged cardiac silhouette and mediastinal contours post median sternotomy and CABG.  There is persistent obscuration of the left heart border secondary to grossly unchanged small left-sided pleural effusion.  Interval removal of right jugular approach vascular sheath.  No pneumothorax.  Trace right-sided pleural effusion is likely unchanged. No definite evidence of pulmonary edema.  Unchanged bones.  IMPRESSION: Grossly unchanged small left-sided pleural effusion and associated adjacent consolidative opacities, atelectasis versus infiltrate. No definite evidence of edema or pneumothorax.   Original Report Authenticated By: Tacey Ruiz, MD      Discharge Medications: The patient has been discharged on:    Medication List    STOP taking these medications       amLODipine 5 MG tablet  Commonly known as:  NORVASC     nitroGLYCERIN 0.4 MG SL tablet  Commonly known as:  NITROSTAT      TAKE these medications       amiodarone 200 MG tablet  Commonly known as:  PACERONE  Take 1 tablet (200 mg total) by mouth 2 (two) times daily.     aspirin 81 MG EC tablet  Take 81 mg by mouth every morning.     ergocalciferol 50000 UNITS capsule  Commonly known as:  VITAMIN D2  Take 50,000 Units by mouth once a week. Takes on Thursday     esomeprazole 40 MG capsule   Commonly known as:  NEXIUM  Take 40 mg by mouth daily.     furosemide 40 MG tablet  Commonly known as:  LASIX  Take 1 tablet (40 mg total) by mouth daily. X 1 week     levothyroxine 75 MCG tablet  Commonly known as:  SYNTHROID, LEVOTHROID  Take 75 mcg by mouth daily.     losartan 25 MG tablet  Commonly known as:  COZAAR  Take 1 tablet (25 mg total) by mouth daily.     metFORMIN 500 MG tablet  Commonly known as:  GLUCOPHAGE  Take 500 mg by mouth 2 (two) times daily with a meal.     metoprolol tartrate 25 MG tablet  Commonly known as:  LOPRESSOR  Take 1 tablet (25 mg total) by mouth 2 (two) times daily.     tamsulosin 0.4 MG Caps  Commonly known as:  FLOMAX  Take 0.4 mg by mouth at bedtime.     traMADol 50 MG tablet  Commonly known as:  ULTRAM  Take 1-2 tablets (50-100 mg total) by mouth every 4 (four) hours as needed for pain.       1.Beta Blocker:  Yes [ x  ]  No   [   ]                              If No, reason:  2.Ace Inhibitor/ARB: Yes [  x ]                                     No  [    ]                                     If No, reason:  3.Statin:   Yes [ x  ]                  No  [   ]                  If No, reason:  4.Ecasa:  Yes  [x   ]                  No   [   ]                  If No, reason:  Follow Up Appointments: Follow-up Information   Follow up with Peyton Bottoms, MD. (Call for a follow up appointment for 2 weeks)    Contact information:   [address] [place] [address] [phone]       Follow up with VAN Dinah Beers, MD. (PA/LAT CXR to be taken (at [facility] which is in the same building as Dr. Zenaida Niece Trigt's office)1 hour prior to office appointment with Dr. Hazeline Junker will mail appointment date and time)    Contact information:   [address] [place] [address] [phone]       Follow up with Juliette Alcide, MD. (Call regarding further surveillance of  HGA1C 6.6)    Contact information:   [address] [place] [address] [phone]       Signed: Doree Fudge MPA-C 09/05/2012, 2:08 PM

## 2012-09-03 NOTE — Progress Notes (Addendum)
                   [address]            [place] [address]          [phone]      4 Days Post-Op Procedure(s) (LRB): CORONARY ARTERY BYPASS GRAFTING (CABG) (N/A) INTRAOPERATIVE TRANSESOPHAGEAL ECHOCARDIOGRAM (N/A)  Subjective: Patient feeling fairly well this am. Passing flatus but no bowel movement  Objective: Vital signs in last 24 hours: Temp:  [97.5 F (36.4 C)-98.3 F (36.8 C)] 97.8 F (36.6 C) (04/26 0513) Pulse Rate:  [57-85] 63 (04/26 0513) Cardiac Rhythm:  [-] Normal sinus rhythm (04/25 2000) Resp:  [18-23] 20 (04/26 0513) BP: (93-129)/(57-77) 104/60 mmHg (04/26 0513) SpO2:  [93 %-98 %] 93 % (04/26 0513) Weight:  [103 kg (227 lb 1.2 oz)] 103 kg (227 lb 1.2 oz) (04/26 0513)  Pre op weight  100 kg Current Weight  09/03/12 103 kg (227 lb 1.2 oz)      Intake/Output from previous day: 04/25 0701 - 04/26 0700 In: 960 [P.O.:940; I.V.:20] Out: 550 [Urine:550]   Physical Exam:  Cardiovascular: RRR Pulmonary: Diminished at left base; right lung is clear;no rales, wheezes, or rhonchi. Abdomen: Soft, non tender, some distention,bowel sounds present. Extremities: Mild lower extremity edema Wounds: Sternal dressing is clean and dry. Right lower extremity wound is dry, no sign of infection.  Lab Results: CBC:  Recent Labs  09/02/12 0417 09/03/12 0506  WBC 13.0* 9.7  HGB 10.6* 10.3*  HCT 31.2* 30.4*  PLT 120* 162   BMET:   Recent Labs  09/01/12 0420 09/02/12 0417  NA 134* 131*  K 4.5 4.3  CL 99 96  CO2 27 27  GLUCOSE 135* 132*  BUN 18 28*  CREATININE 1.18 1.37*  CALCIUM 8.4 8.3*    PT/INR:  Lab Results  Component Value Date   INR 1.31 08/30/2012   INR 0.98 08/29/2012   ABG:  INR: Will add last result for INR, ABG once components are confirmed Will add last 4 CBG results once components are confirmed  Assessment/Plan:  1. CV - Previous A fib with RVR.Converted to SR in the 60's and is maintaining. Having some PVCs  this am. Back up external pacer at 45. On Lopressor 12.5 bid, Losartan 25 daily, and Amiodarone 200 bid. Unable to increase Lopressor as bp labile 2.  Pulmonary - CXR this am appears to show left pleural effusion and atelectasis, cardiomegaly, no pneumothorax. Monitor need for left thoracentesis.Encourage incentive spirometer and flutter valve.Wean oxygen (on 2 liters via [place]) as tolerates. 3. Volume Overload - On Lasix 40 oral daily. Will give IV this am 4.  Acute blood loss anemia - H and H stable at 10.3 and 30.4 5.DM-CBGs 131/112/115.Pre op HGA1C 6.6.Continue Metformin 500 bid 6.Thrombocytopenia resolved-platelets 162,000 7.LOC constipation 8.Possible discharge Monday  ZIMMERMAN,DONIELLE MPA-C 09/03/2012,8:46 AM   Chart reviewed, patient examined, agree with above. His cxr shows small bilateral effusions. There is significant left lower lobe atelectasis. Contine IS and Flutter valve.

## 2012-09-04 LAB — BASIC METABOLIC PANEL
CO2: 27 mEq/L (ref 19–32)
Chloride: 97 mEq/L (ref 96–112)
Creatinine, Ser: 1.45 mg/dL — ABNORMAL HIGH (ref 0.50–1.35)
Potassium: 3.6 mEq/L (ref 3.5–5.1)
Sodium: 133 mEq/L — ABNORMAL LOW (ref 135–145)

## 2012-09-04 LAB — GLUCOSE, CAPILLARY
Glucose-Capillary: 98 mg/dL (ref 70–99)
Glucose-Capillary: 111 mg/dL — ABNORMAL HIGH (ref 70–99)
Glucose-Capillary: 114 mg/dL — ABNORMAL HIGH (ref 70–99)
Glucose-Capillary: 97 mg/dL (ref 70–99)

## 2012-09-04 MED ORDER — POTASSIUM CHLORIDE CRYS ER 20 MEQ PO TBCR
40.0000 meq | EXTENDED_RELEASE_TABLET | Freq: Once | ORAL | Status: AC
  Administered 2012-09-04: 40 meq via ORAL
  Filled 2012-09-04: qty 2

## 2012-09-04 MED ORDER — POLYETHYLENE GLYCOL 3350 17 G PO PACK
17.0000 g | PACK | Freq: Once | ORAL | Status: AC
  Administered 2012-09-04: 17 g via ORAL
  Filled 2012-09-04: qty 1

## 2012-09-04 MED ORDER — POTASSIUM CHLORIDE CRYS ER 20 MEQ PO TBCR
20.0000 meq | EXTENDED_RELEASE_TABLET | Freq: Every day | ORAL | Status: DC
  Administered 2012-09-04: 20 meq via ORAL
  Filled 2012-09-04 (×2): qty 1

## 2012-09-04 NOTE — Progress Notes (Addendum)
                   [address]            [place] [address]          [phone]      5 Days Post-Op Procedure(s) (LRB): CORONARY ARTERY BYPASS GRAFTING (CABG) (N/A) INTRAOPERATIVE TRANSESOPHAGEAL ECHOCARDIOGRAM (N/A)  Subjective: Patient taking a nap this morning. He has still not had a bowel movement yet.  Objective: Vital signs in last 24 hours: Temp:  [97.4 F (36.3 C)-98.5 F (36.9 C)] 98.5 F (36.9 C) (04/27 0425) Pulse Rate:  [62-71] 62 (04/27 0425) Cardiac Rhythm:  [-] Normal sinus rhythm (04/26 1930) Resp:  [18-20] 18 (04/27 0425) BP: (100-127)/(58-78) 127/64 mmHg (04/27 0425) SpO2:  [93 %-95 %] 93 % (04/27 0425) Weight:  [102.331 kg (225 lb 9.6 oz)] 102.331 kg (225 lb 9.6 oz) (04/27 0425)  Pre op weight  100 kg Current Weight  09/04/12 102.331 kg (225 lb 9.6 oz)      Intake/Output from previous day: 04/26 0701 - 04/27 0700 In: 3 [I.V.:3] Out: 230 [Urine:230]   Physical Exam:  Cardiovascular: RRR Pulmonary: Diminished at left base; right lung is clear;no rales, wheezes, or rhonchi. Abdomen: Soft, non tender, some distention,bowel sounds present. Extremities: Mild lower extremity edema Wounds: Sternal dressing is clean and dry. Right lower extremity wound is dry, no sign of infection.  Lab Results: CBC:  Recent Labs  09/02/12 0417 09/03/12 0506  WBC 13.0* 9.7  HGB 10.6* 10.3*  HCT 31.2* 30.4*  PLT 120* 162   BMET:   Recent Labs  09/02/12 0417 09/04/12 0557  NA 131* 133*  K 4.3 3.6  CL 96 97  CO2 27 27  GLUCOSE 132* 99  BUN 28* 39*  CREATININE 1.37* 1.45*  CALCIUM 8.3* 8.2*    PT/INR:  Lab Results  Component Value Date   INR 1.31 08/30/2012   INR 0.98 08/29/2012   ABG:  INR: Will add last result for INR, ABG once components are confirmed Will add last 4 CBG results once components are confirmed  Assessment/Plan:  1. CV - Previous A fib with RVR.Converted to SR in the 60's and is maintaining.On Lopressor  12.5 bid, Losartan 25 daily, and Amiodarone 200 bid.  2.  Pulmonary - Encourage incentive spirometer and flutter valve.Wean oxygen (on 2 liters via [place]) as tolerates. 3. Volume Overload - On Lasix 40 oral daily. 4.  Acute blood loss anemia - H and H stable at 10.3 and 30.4 5.DM-CBGs 104/103/98.Pre op HGA1C 6.6.Continue Metformin 500 bid 6.Supplement potassium 7.Creatinine slightly increased from 1.37 to 1.45. Was given Lasix IV yesterday. Monitor 8.LOC constipation 9.Possible discharge Monday  ZIMMERMAN,DONIELLE MPA-C 09/04/2012,8:49 AM    Chart reviewed, patient examined, agree with above.

## 2012-09-05 LAB — BASIC METABOLIC PANEL
CO2: 28 mEq/L (ref 19–32)
Calcium: 8.5 mg/dL (ref 8.4–10.5)
GFR calc Af Amer: 60 mL/min — ABNORMAL LOW (ref >90)
GFR calc non Af Amer: 52 mL/min — ABNORMAL LOW (ref >90)
Sodium: 135 mEq/L (ref 135–145)

## 2012-09-05 LAB — GLUCOSE, CAPILLARY
Glucose-Capillary: 113 mg/dL — ABNORMAL HIGH (ref 70–99)
Glucose-Capillary: 120 mg/dL — ABNORMAL HIGH (ref 70–99)
Glucose-Capillary: 109 mg/dL — ABNORMAL HIGH (ref 70–99)
Glucose-Capillary: 116 mg/dL — ABNORMAL HIGH (ref 70–99)

## 2012-09-05 MED ORDER — METOPROLOL TARTRATE 1 MG/ML IV SOLN
INTRAVENOUS | Status: AC
  Filled 2012-09-05: qty 5

## 2012-09-05 MED ORDER — METOPROLOL TARTRATE 1 MG/ML IV SOLN
2.5000 mg | Freq: Once | INTRAVENOUS | Status: AC
  Administered 2012-09-05: 2.5 mg via INTRAVENOUS

## 2012-09-05 MED ORDER — POTASSIUM CHLORIDE CRYS ER 20 MEQ PO TBCR
20.0000 meq | EXTENDED_RELEASE_TABLET | Freq: Once | ORAL | Status: AC
  Administered 2012-09-05: 20 meq via ORAL
  Filled 2012-09-05: qty 1

## 2012-09-05 MED ORDER — METOPROLOL TARTRATE 25 MG PO TABS
25.0000 mg | ORAL_TABLET | Freq: Two times a day (BID) | ORAL | Status: DC
  Administered 2012-09-05 – 2012-09-07 (×4): 25 mg via ORAL
  Filled 2012-09-05 (×6): qty 1

## 2012-09-05 NOTE — Clinical Social Work Note (Signed)
Clinical Social Worker continuing to follow patient and family for support and to facilitate patient discharge needs.  Patient remains medically not ready for discharge, but hopeful for discharge tomorrow.  Patient and brother have chosen [facility] and patient brother has chosen to provide transportation at discharge.  CSW notified facility of patient current discharge plans.  CSW remains available for support and to facilitate patient discharge needs once medically stable.  Macario Golds, [place] [phone]

## 2012-09-05 NOTE — Progress Notes (Addendum)
                   [address]            [place] [address]          [phone]      6 Days Post-Op Procedure(s) (LRB): CORONARY ARTERY BYPASS GRAFTING (CABG) (N/A) INTRAOPERATIVE TRANSESOPHAGEAL ECHOCARDIOGRAM (N/A)  Subjective: Patient with bowel movement  Objective: Vital signs in last 24 hours: Temp:  [98.4 F (36.9 C)-99.1 F (37.3 C)] 99.1 F (37.3 C) (04/28 0411) Pulse Rate:  [64-84] 64 (04/28 0411) Cardiac Rhythm:  [-] Normal sinus rhythm (04/27 1930) Resp:  [18-19] 19 (04/28 0411) BP: (109-131)/(64-75) 131/68 mmHg (04/28 0411) SpO2:  [91 %-97 %] 92 % (04/28 0411) Weight:  [100.472 kg (221 lb 8 oz)] 100.472 kg (221 lb 8 oz) (04/28 0411)  Pre op weight  100 kg Current Weight  09/05/12 100.472 kg (221 lb 8 oz)      Intake/Output from previous day: 04/27 0701 - 04/28 0700 In: 600 [P.O.:600] Out: 1751 [Urine:1750; Stool:1]   Physical Exam:  Cardiovascular: IRRR  Pulmonary: Diminished at left base; right lung is clear;no rales, wheezes, or rhonchi. Abdomen: Soft, non tender, some distention,bowel sounds present. Extremities: Mild lower extremity edema Wounds: Sternal dressing is clean and dry. Right lower extremity wound is dry, no sign of infection.  Lab Results: CBC:  Recent Labs  09/03/12 0506  WBC 9.7  HGB 10.3*  HCT 30.4*  PLT 162   BMET:   Recent Labs  09/04/12 0557 09/05/12 0524  NA 133* 135  K 3.6 3.8  CL 97 98  CO2 27 28  GLUCOSE 99 114*  BUN 39* 33*  CREATININE 1.45* 1.30  CALCIUM 8.2* 8.5    PT/INR:  Lab Results  Component Value Date   INR 1.31 08/30/2012   INR 0.98 08/29/2012   ABG:  INR: Will add last result for INR, ABG once components are confirmed Will add last 4 CBG results once components are confirmed  Assessment/Plan:  1. CV - Previous A fib with RVR.Converted to SR in the 60's. PVCS this am. On Lopressor 12.5 bid, Losartan 25 daily, and Amiodarone 200 bid.  2.  Pulmonary - Encourage  incentive spirometer and flutter valve.Wean oxygen (on 2 liters via [place]) as tolerates. 3. Volume Overload - On Lasix 40 oral daily. 4.  Acute blood loss anemia - H and H stable at 10.3 and 30.4 5.DM-CBGs 114/97/113 .Pre op HGA1C 6.6.Continue Metformin 500 bid 6.Supplement potassium 7.Creatinine decreased from 1.45 to 1.3 8. Remove EPW 9.SNF soon  ZIMMERMAN,DONIELLE MPA-C 09/05/2012,7:50 AM     Went back into a fib this AM with Hr in 120s- now in 95- 120 range

## 2012-09-05 NOTE — Progress Notes (Signed)
Pt converted to Afib HR 120 BP 115/69. Order to d/c EPW this AM. Order on hold until HR is more stable. Pacing wires left in. PA aware. Order to give 2.5 mg IV metoprolol. Will continue to monitor. Dion Saucier

## 2012-09-05 NOTE — Progress Notes (Signed)
CARDIAC REHAB PHASE I   PRE:  Rate/Rhythm: 65 SR with PVC to 113 afib    BP: sitting 130/80    SaO2: 95 2L, 91-92 RA  MODE:  Ambulation: 250 ft   POST:  Rate/Rhythm: 115 afib    BP: sitting 160/76     SaO2: 91-92 RA  Pt went from SR with PVCs to afib right before walk. Asymptomatic. Rate controlled at 110s. SaO2 without O2 in low 90s at rest and walking. Able to stand and walk with RW, assist x2 (really more supervision). Tired, some SOB after walk. To bed. SaO2 in bed 89-90 RA, reapplied 02. Will f/u. Pt sts he did not walk Sunday. [phone]   Elissa Lovett Kristan CES, ACSM 09/05/2012 10:00 AM

## 2012-09-06 LAB — GLUCOSE, CAPILLARY
Glucose-Capillary: 108 mg/dL — ABNORMAL HIGH (ref 70–99)
Glucose-Capillary: 143 mg/dL — ABNORMAL HIGH (ref 70–99)
Glucose-Capillary: 92 mg/dL (ref 70–99)
Glucose-Capillary: 111 mg/dL — ABNORMAL HIGH (ref 70–99)

## 2012-09-06 MED ORDER — ASPIRIN EC 81 MG PO TBEC
81.0000 mg | DELAYED_RELEASE_TABLET | Freq: Every day | ORAL | Status: DC
  Administered 2012-09-07: 81 mg via ORAL
  Filled 2012-09-06: qty 1

## 2012-09-06 MED ORDER — APIXABAN 5 MG PO TABS
5.0000 mg | ORAL_TABLET | Freq: Two times a day (BID) | ORAL | Status: DC
  Administered 2012-09-06 – 2012-09-07 (×3): 5 mg via ORAL
  Filled 2012-09-06 (×4): qty 1

## 2012-09-06 NOTE — Progress Notes (Signed)
Physical Therapy Treatment Patient Details Name: Adam Peck MRN: [id number] DOB: 1933-06-22 Today's Date: 09/06/2012 Time: 4098-1191 PT Time Calculation (min): 23 min  PT Assessment / Plan / Recommendation Comments on Treatment Session  Pt demonstrates steady progress towards PT goals at this time. Pt able to increase ambulation with less assist for all other aspects of mobility. Pt tolerated ther-ex well. Will continue to see and progress activity as tolerated.    Follow Up Recommendations  SNF     Does the patient have the potential to tolerate intense rehabilitation     Barriers to Discharge        Equipment Recommendations  None recommended by PT    Recommendations for Other Services    Frequency Min 2X/week   Plan Discharge plan remains appropriate    Precautions / Restrictions Precautions Precautions: Sternal   Pertinent Vitals/Pain No pain at this time. SpO2 during activity 89% on rm air at lowest; 95% rm air prior to activity    Mobility  Bed Mobility Bed Mobility: Supine to Sit;Sitting - Scoot to Edge of Bed Supine to Sit: 3: Mod assist Sitting - Scoot to Edge of Bed: 5: Supervision Details for Bed Mobility Assistance: VCs for body positioning and sequencing; Assist for elevation of trunk with sternal precautions Transfers Transfers: Sit to Stand;Stand to Sit Sit to Stand: 4: Min assist Stand to Sit: 4: Min assist Details for Transfer Assistance: VCs for sternal precautions Ambulation/Gait Ambulation/Gait Assistance: 4: Min guard Ambulation Distance (Feet): 160 Feet Assistive device: Rolling walker Ambulation/Gait Assistance Details: multiple rest breaks required, pating ambulated on rm air desats to 89% rebounding well Gait Pattern: Step-through pattern;Shuffle Gait velocity: decreased General Gait Details: overall steady progress with ambulation Stairs: No    Exercises General Exercises - Lower Extremity Ankle Circles/Pumps: AROM;Both;10 reps Long  Arc Quad: AROM;Both;10 reps Hip Flexion/Marching: AROM;Both;10 reps Mini-Sqauts: AROM;10 reps;Standing    PT Goals Acute Rehab PT Goals PT Goal Formulation: With patient Time For Goal Achievement: 09/16/12 Potential to Achieve Goals: Good Pt will go Supine/Side to Sit: Independently PT Goal: Supine/Side to Sit - Progress: Progressing toward goal Pt will go Sit to Supine/Side: Independently PT Goal: Sit to Supine/Side - Progress: Progressing toward goal Pt will go Sit to Stand: with modified independence PT Goal: Sit to Stand - Progress: Progressing toward goal Pt will go Stand to Sit: with modified independence PT Goal: Stand to Sit - Progress: Progressing toward goal Pt will Ambulate: >150 feet;with modified independence PT Goal: Ambulate - Progress: Progressing toward goal  Visit Information  Last PT Received On: 09/06/12 Assistance Needed: +1    Subjective Data  Subjective: I feel really weak Patient Stated Goal: to get better   Cognition  Cognition Arousal/Alertness: Awake/alert Behavior During Therapy: WFL for tasks assessed/performed Overall Cognitive Status: Within Functional Limits for tasks assessed    Balance  Static Sitting Balance Static Sitting - Balance Support: Feet supported;No upper extremity supported Static Sitting - Level of Assistance: 6: Modified independent (Device/Increase time) Static Sitting - Comment/# of Minutes: 12 minutes during ther-ex  End of Session PT - End of Session Equipment Utilized During Treatment: Gait belt Activity Tolerance: Patient tolerated treatment well Patient left: in chair;with call bell/phone within reach Nurse Communication: Mobility status   GP     Fabio Asa 09/06/2012, 11:08 AM Charlotte Crumb, PT DPT  [phone]

## 2012-09-06 NOTE — Progress Notes (Signed)
                    [address]            [place] [address]          [phone]     7 Days Post-Op Procedure(s) (LRB): CORONARY ARTERY BYPASS GRAFTING (CABG) (N/A) INTRAOPERATIVE TRANSESOPHAGEAL ECHOCARDIOGRAM (N/A)  Subjective: Feels ok this am, sore, but no other complaints.  Rhythm/rate generally stable overnight.   Objective: Vital signs in last 24 hours: Patient Vitals for the past 24 hrs:  BP Temp Temp src Pulse Resp SpO2 Weight  09/06/12 0720 - - - - - - 218 lb 0.6 oz (98.9 kg)  09/06/12 0326 111/79 mmHg 98 F (36.7 C) Oral 96 19 95 % -  09/05/12 2015 104/73 mmHg 99 F (37.2 C) Oral 108 19 96 % -  09/05/12 1416 99/69 mmHg 98.2 F (36.8 C) Oral 116 18 99 % -  09/05/12 1011 115/69 mmHg - - 112 - - -   Current Weight  09/06/12 218 lb 0.6 oz (98.9 kg)     Intake/Output from previous day: 04/28 0701 - 04/29 0700 In: 843 [P.O.:840; I.V.:3] Out: 550 [Urine:550]  CBGs [phone]  PHYSICAL EXAM:  Heart: RRR, occ PVCs on tele Lungs: Clear Wound: Clean and dry Extremities: No significant LE edema    Lab Results: CBC:No results found for this basename: WBC, HGB, HCT, PLT,  in the last 72 hours BMET:  Recent Labs  09/04/12 0557 09/05/12 0524  NA 133* 135  K 3.6 3.8  CL 97 98  CO2 27 28  GLUCOSE 99 114*  BUN 39* 33*  CREATININE 1.45* 1.30  CALCIUM 8.2* 8.5    PT/INR: No results found for this basename: LABPROT, INR,  in the last 72 hours    Assessment/Plan: S/P Procedure(s) (LRB): CORONARY ARTERY BYPASS GRAFTING (CABG) (N/A) INTRAOPERATIVE TRANSESOPHAGEAL ECHOCARDIOGRAM (N/A)  CV- AF, now SR.  Continue Lopressor, Amio, Cozaar.  DM- sugars stable on po meds.  Vol overload- diurese.  Hypokalemia- replace K+.  Acute renal insufficiency- Cr trending down.  Watch.  CRPI/ambulation.  Hopefully ready for SNF in next day or so if he continues to progress.   LOS: 7 days    Thandiwe Siragusa H 09/06/2012

## 2012-09-06 NOTE — Progress Notes (Signed)
09/06/2012 2030  Pt ambulated 566ft with RW x 1 assist, rested three times. Will continue to monitor. Ilai Hiller, Avie Echevaria , RN

## 2012-09-06 NOTE — Progress Notes (Signed)
D/C EPW per protocol and as ordered, pt tolerated well, all ends intact, no bleeding/ectopy noted.  Pt reminded to lie supine approximately one hour.  VSS, last INR 1.31. Call bell within reach, will continue to monitor.

## 2012-09-06 NOTE — Progress Notes (Signed)
CARDIAC REHAB PHASE I   PRE:  Rate/Rhythm: 64 SR    BP: sitting 105/55    SaO2: 97 RA  MODE:  Ambulation: 450 ft   POST:  Rate/Rhythm: 71 SR with PVC    BP: sitting 116/60     SaO2: 89-90 RA  Tolerated well with RW. Rest x2 due to DOE. SaO2 89-91 RA walking. To recliner after walk, encouraged IS. Will f/u. [phone]   Elissa Lovett [place] CES, ACSM 09/06/2012 2:20 PM

## 2012-09-06 NOTE — Progress Notes (Signed)
Pt ambulated 368ft w/ RW x 1 assist, rested twice. Will continue to monitor

## 2012-09-07 ENCOUNTER — Encounter (HOSPITAL_COMMUNITY): Payer: Self-pay | Admitting: Cardiology

## 2012-09-07 LAB — GLUCOSE, CAPILLARY
Glucose-Capillary: 112 mg/dL — ABNORMAL HIGH (ref 70–99)
Glucose-Capillary: 109 mg/dL — ABNORMAL HIGH (ref 70–99)

## 2012-09-07 MED ORDER — LOSARTAN POTASSIUM 25 MG PO TABS: 25.0000 mg | ORAL_TABLET | Freq: Every day | ORAL | Status: DC

## 2012-09-07 MED ORDER — TRAMADOL HCL 50 MG PO TABS: 50.0000 mg | ORAL_TABLET | ORAL | Status: DC | PRN

## 2012-09-07 MED ORDER — METOPROLOL TARTRATE 25 MG PO TABS: 25.0000 mg | ORAL_TABLET | Freq: Two times a day (BID) | ORAL | Status: DC

## 2012-09-07 MED ORDER — AMIODARONE HCL 200 MG PO TABS: 200.0000 mg | ORAL_TABLET | Freq: Two times a day (BID) | ORAL | Status: DC

## 2012-09-07 MED ORDER — FUROSEMIDE 40 MG PO TABS: 40.0000 mg | ORAL_TABLET | Freq: Every day | ORAL | Status: DC

## 2012-09-07 NOTE — Progress Notes (Signed)
Chest tube sutures removed at this time; steri strips applied to sites; will cont. To monitor.

## 2012-09-07 NOTE — Clinical Social Work Note (Signed)
Clinical Social Worker facilitated patient discharge including contacting patient family and facility to confirm patient discharge plans.  Patient brother provided transportation via car to [facility].  Patient brother plans to complete paperwork upon arrival - facility aware and agreeable.  RN to call report - phone number provided.    Clinical Social Worker will sign off for now as social work intervention is no longer needed. Please consult [place] again if new need arises.  Macario Golds, [place] [phone]

## 2012-09-07 NOTE — Progress Notes (Signed)
Discussed with pt ed. Voiced understanding. Requests his name be sent to [facility]. Sts he feels weak today. Assisted pt back to bed for rest. RN will set up video later. Got pt lung pillow as he sts he never received one. Appreciative. [phone] Ethelda Chick CES, ACSM 8:29 AM 09/07/2012

## 2012-09-07 NOTE — Progress Notes (Signed)
                    [address]            [place] [address]          [phone]     8 Days Post-Op Procedure(s) (LRB): CORONARY ARTERY BYPASS GRAFTING (CABG) (N/A) INTRAOPERATIVE TRANSESOPHAGEAL ECHOCARDIOGRAM (N/A)  Subjective: Rested poorly, couldn't get room temp adjusted.  Stable otherwise.   Objective: Vital signs in last 24 hours: Patient Vitals for the past 24 hrs:  BP Temp Temp src Pulse Resp SpO2 Weight  09/07/12 0611 114/68 mmHg 98.5 F (36.9 C) Oral 71 19 92 % -  09/07/12 0553 - - - - - - 216 lb 14.9 oz (98.4 kg)  09/07/12 0500 - - - - - - 216 lb 14.9 oz (98.4 kg)  09/06/12 2200 106/57 mmHg 98 F (36.7 C) Oral 72 19 92 % -  09/06/12 1345 105/55 mmHg - - 68 - - -  09/06/12 1315 93/67 mmHg - - 72 - - -  09/06/12 1300 104/59 mmHg 98.4 F (36.9 C) Oral 70 18 93 % -  09/06/12 1245 112/56 mmHg - - 69 - - -  09/06/12 1230 108/60 mmHg - - 70 - - -  09/06/12 1044 115/52 mmHg - - 67 - - -   Current Weight  09/07/12 216 lb 14.9 oz (98.4 kg)     Intake/Output from previous day: 04/29 0701 - 04/30 0700 In: 600 [P.O.:600] Out: 1601 [Urine:1600; Stool:1]  CBGs 92-111-112    PHYSICAL EXAM:  Heart: RRR Lungs: Clear Wound: Clean and dry Extremities: No edema    Lab Results: CBC:No results found for this basename: WBC, HGB, HCT, PLT,  in the last 72 hours BMET:  Recent Labs  09/05/12 0524  NA 135  K 3.8  CL 98  CO2 28  GLUCOSE 114*  BUN 33*  CREATININE 1.30  CALCIUM 8.5    PT/INR: No results found for this basename: LABPROT, INR,  in the last 72 hours    Assessment/Plan: S/P Procedure(s) (LRB): CORONARY ARTERY BYPASS GRAFTING (CABG) (N/A) INTRAOPERATIVE TRANSESOPHAGEAL ECHOCARDIOGRAM (N/A)  CV- No further AF since yesterday.  BPs stable.  Continue Lopressor, Amio, Cozaar.  DM- continue home meds.  Vol overload- diurese.  Disp- To SNF today.   LOS: 8 days    Denise Washburn H 09/07/2012

## 2012-09-07 NOTE — Progress Notes (Signed)
Pt to d/c to SNF this afternoon; brother to provide transport to facility; brother notified of d/c order; awaiting paperwork to send to facility; will cont. To monitor.

## 2012-09-09 NOTE — Discharge Summary (Signed)
patient examined and medical record reviewed,agree with above note. VAN TRIGT III,PETER 09/09/2012    

## 2012-09-13 ENCOUNTER — Other Ambulatory Visit: Payer: Self-pay | Admitting: *Deleted

## 2012-09-13 DIAGNOSIS — I2510 Atherosclerotic heart disease of native coronary artery without angina pectoris: Secondary | ICD-10-CM

## 2012-09-21 ENCOUNTER — Encounter: Payer: Self-pay | Admitting: Cardiothoracic Surgery

## 2012-09-21 ENCOUNTER — Ambulatory Visit
Admission: RE | Admit: 2012-09-21 | Discharge: 2012-09-21 | Disposition: A | Discharge status: Home / Self Care | Payer: Medicare Other | Source: Ambulatory Visit | Attending: Cardiothoracic Surgery | Admitting: Cardiothoracic Surgery

## 2012-09-21 ENCOUNTER — Ambulatory Visit (INDEPENDENT_AMBULATORY_CARE_PROVIDER_SITE_OTHER): Payer: Self-pay | Admitting: Cardiothoracic Surgery

## 2012-09-21 VITALS — BP 126/84 | HR 80 | Resp 16 | Ht 71.5 in | Wt 211.0 lb

## 2012-09-21 DIAGNOSIS — I2510 Atherosclerotic heart disease of native coronary artery without angina pectoris: Secondary | ICD-10-CM

## 2012-09-21 DIAGNOSIS — Z951 Presence of aortocoronary bypass graft: Secondary | ICD-10-CM

## 2012-09-21 NOTE — Progress Notes (Signed)
PCP is Juliette Alcide, MD Referring Provider is [place], Ashlen Kiger M, MD  Chief Complaint  Patient presents with  . Routine Post Op    f/u after CABG 08/30/12 with cxr    HPI: One month followup after multivessel CABG Patient recovering at [facility] Slowly improving strength, walking almost daily Still needs rolling walker for stability Incisions healing well No recurrent angina or symptoms of CHF Maintaining sinus rhythm on amiodarone Ready to transition to living at home with family support   Past Medical History  Diagnosis Date  . Coronary atherosclerosis of native coronary artery     DES to the OM and RCA 2005  . Dyslipidemia     unable to take meds for this bc of cramps  . Sinus bradycardia   . ED (erectile dysfunction)   . GERD (gastroesophageal reflux disease)     takes Nexium daily  . Essential hypertension, benign     takes Amlodipine and Losartan daily  . Hypothyroidism     takes Synthroid daily  . Enlarged prostate     takes Flomax daily  . Type 2 diabetes mellitus     takes Metformin daily  . History of bronchitis     last time several yrs ago  . Headache     occasionally  . Tick bite 06/2012  . Carpal tunnel syndrome   . Arthritis   . Joint pain   . Joint swelling   . History of gout   . Urinary urgency   . Nocturia   . Anxiety     doesn't take any meds for this    Past Surgical History  Procedure Laterality Date  . L4-5 laminectomy      x 2   . Cervical fusion    . Hernia repair    . Cardiac catheterization    . Coronary angioplasty      2 stents  . Coronary artery bypass graft N/A 08/30/2012    Procedure: CORONARY ARTERY BYPASS GRAFTING (CABG);  Surgeon: Kerin Perna, MD;  Location: [facility] OR;  Service: Open Heart Surgery;  Laterality: N/A;  . Intraoperative transesophageal echocardiogram N/A 08/30/2012    Procedure: INTRAOPERATIVE TRANSESOPHAGEAL ECHOCARDIOGRAM;  Surgeon: Kerin Perna, MD;  Location: [facility] OR;  Service: Open Heart  Surgery;  Laterality: N/A;    No family history on file.  Social History History  Substance Use Topics  . Smoking status: Former Smoker    Types: Cigarettes    Quit date: 05/11/1968  . Smokeless tobacco: Former Neurosurgeon    Types: Chew    Quit date: 05/11/1978  . Alcohol Use: No    Current Outpatient Prescriptions  Medication Sig Dispense Refill  . amiodarone (PACERONE) 200 MG tablet Take 1 tablet (200 mg total) by mouth 2 (two) times daily.  60 tablet  1  . aspirin 81 MG EC tablet Take 81 mg by mouth every morning.       . calcium-vitamin D (OSCAL WITH D) 500-200 MG-UNIT per tablet Take 1 tablet by mouth 2 (two) times daily.      Marland Kitchen docusate sodium (COLACE) 100 MG capsule Take 100 mg by mouth 2 (two) times daily.      . ergocalciferol (VITAMIN D2) 50000 UNITS capsule Take 50,000 Units by mouth once a week. Takes on Thursday      . esomeprazole (NEXIUM) 40 MG capsule Take 40 mg by mouth daily.        Marland Kitchen levothyroxine (SYNTHROID, LEVOTHROID) 75 MCG tablet Take  75 mcg by mouth daily.        Marland Kitchen LORazepam (ATIVAN) 0.5 MG tablet Take 0.5 mg by mouth every 8 (eight) hours as needed for anxiety.      Marland Kitchen losartan (COZAAR) 25 MG tablet Take 1 tablet (25 mg total) by mouth daily.  30 tablet  1  . metFORMIN (GLUCOPHAGE) 500 MG tablet Take 500 mg by mouth 2 (two) times daily with a meal.      . metoprolol tartrate (LOPRESSOR) 25 MG tablet Take 1 tablet (25 mg total) by mouth 2 (two) times daily.      . Multiple Vitamin (MULTIVITAMIN) tablet Take 1 tablet by mouth daily.      . polyethylene glycol (MIRALAX / GLYCOLAX) packet Take 17 g by mouth daily.      . Tamsulosin HCl (FLOMAX) 0.4 MG CAPS Take 0.4 mg by mouth at bedtime.       . traMADol (ULTRAM) 50 MG tablet Take 1-2 tablets (50-100 mg total) by mouth every 4 (four) hours as needed for pain.  30 tablet  0  . vitamin C (ASCORBIC ACID) 500 MG tablet Take 500 mg by mouth daily. Ends 10/05/12      . zinc gluconate 50 MG tablet Take 50 mg by mouth daily.  Ends 09/21/12       No current facility-administered medications for this visit.    Allergies  Allergen Reactions  . Ezetimibe Other (See Comments)    Reaction:muscle cramps  . Statins Other (See Comments)    Reaction:muscle cramps    Review of Systems Improved appetite, improved strength, sleeping better, no edema BP 126/84  Pulse 80  Resp 16  Ht 5' 11.5" (1.816 m)  Wt 211 lb (95.709 kg)  BMI 29.02 kg/m2  SpO2 93% Pressure 126/70 Physical Exam Alert and comfortable Lungs clear on right, breath sounds diminished left base Cardiac rhythm regular sinus rhythm No murmur No pedal edema Chest and leg incisions healing well  Diagnostic Tests:  Chest x-ray shows left mild to moderate pleural effusion  Impression: Doing well postop CABG Ready to return home safely Will resume Lasix 40 mg a day for left pleural effusion and followup with chest x-ray in 4 weeks Prescription for rolling walker given the patient to take a medical supply store Plan: Return with chest x-ray in 4 weeks

## 2012-09-22 ENCOUNTER — Other Ambulatory Visit: Payer: Self-pay | Admitting: *Deleted

## 2012-09-22 DIAGNOSIS — I4891 Unspecified atrial fibrillation: Secondary | ICD-10-CM

## 2012-09-22 MED ORDER — AMIODARONE HCL 200 MG PO TABS: 200.0000 mg | ORAL_TABLET | Freq: Two times a day (BID) | ORAL | Status: DC

## 2012-10-04 ENCOUNTER — Telehealth: Payer: Self-pay

## 2012-10-04 DIAGNOSIS — N182 Chronic kidney disease, stage 2 (mild): Secondary | ICD-10-CM

## 2012-10-04 DIAGNOSIS — R0602 Shortness of breath: Secondary | ICD-10-CM

## 2012-10-04 DIAGNOSIS — I2510 Atherosclerotic heart disease of native coronary artery without angina pectoris: Secondary | ICD-10-CM

## 2012-10-04 NOTE — Telephone Encounter (Signed)
Daughter informed and verbalizes understanding of plan. CXR order faxed to [facility].

## 2012-10-04 NOTE — Telephone Encounter (Signed)
Hold losartan for now. Presumably Lasix is only PRN. Go ahead and get a PA and lateral chest x-ray, also followup echocardiogram. May be able to coordinate this with the office visit this week.

## 2012-10-04 NOTE — Telephone Encounter (Signed)
Spoke with daughter to get readings of BP and syptoms 10/03/12 9:00 am BP 112/64 HR 81 & Patient stood up and BP 88/56    10/04/12 9:00 am BP 106/68 HR 73 12:00 noon BP 120/64 HR 76 4 minutes after standing BP 100/60 HR 88  Patient didn't take losartan on today and only took furosemide 20 per Dr. Leandrew Koyanagi. Daughter concerned about patient being SOB that comes and goes mainly when he is talking and when he gets up to walk.

## 2012-10-04 NOTE — Telephone Encounter (Signed)
Patient daughter called to request earlier appointment per Dr. Salomon Fick office.  Patient BP per home health has been bottoming out.  Patient had appt with Dr. Diona Browner next week, moved appointment to this week with Gene.    Please call patient daughter regarding BP issue.  [name].

## 2012-10-04 NOTE — Telephone Encounter (Signed)
Nursing please call to check on blood pressure readings, may be able to hold antihypertensives pending upcoming visit with Gene.

## 2012-10-06 ENCOUNTER — Ambulatory Visit (INDEPENDENT_AMBULATORY_CARE_PROVIDER_SITE_OTHER): Payer: Medicare Other | Admitting: Physician Assistant

## 2012-10-06 ENCOUNTER — Encounter: Payer: Self-pay | Admitting: Physician Assistant

## 2012-10-06 ENCOUNTER — Other Ambulatory Visit (INDEPENDENT_AMBULATORY_CARE_PROVIDER_SITE_OTHER): Payer: Medicare Other

## 2012-10-06 ENCOUNTER — Other Ambulatory Visit: Payer: Self-pay

## 2012-10-06 VITALS — BP 110/76 | HR 90 | Ht 71.0 in | Wt 202.0 lb

## 2012-10-06 DIAGNOSIS — I4891 Unspecified atrial fibrillation: Secondary | ICD-10-CM

## 2012-10-06 DIAGNOSIS — I2510 Atherosclerotic heart disease of native coronary artery without angina pectoris: Secondary | ICD-10-CM

## 2012-10-06 DIAGNOSIS — I951 Orthostatic hypotension: Secondary | ICD-10-CM

## 2012-10-06 DIAGNOSIS — R0602 Shortness of breath: Secondary | ICD-10-CM

## 2012-10-06 DIAGNOSIS — I480 Paroxysmal atrial fibrillation: Secondary | ICD-10-CM

## 2012-10-06 MED ORDER — ASPIRIN 325 MG PO TABS: 325.0000 mg | ORAL_TABLET | Freq: Every day | ORAL | Status: DC

## 2012-10-06 MED ORDER — FUROSEMIDE 20 MG PO TABS: 20.0000 mg | ORAL_TABLET | Freq: Every day | ORAL | Status: DC

## 2012-10-06 MED ORDER — AMIODARONE HCL 200 MG PO TABS: 200.0000 mg | ORAL_TABLET | Freq: Every day | ORAL | Status: DC

## 2012-10-06 MED ORDER — METOPROLOL TARTRATE 25 MG PO TABS: 12.5000 mg | ORAL_TABLET | Freq: Two times a day (BID) | ORAL | Status: DC

## 2012-10-06 NOTE — Progress Notes (Signed)
Primary Cardiologist: Simona Huh, MD   HPI: Post hospital followup from [facility], status post CABG, by Dr. Donata Clay, on April 22.  He was initially referred for elective cardiac catheterization, per Dr. Diona Browner, when last seen in clinic on April 3, for further evaluation of recent abnormal GXT Cardiolite, ordered by Dr. Leandrew Koyanagi. This yielded inferior ischemia; EF 56%.   - Cardiac catheterization, April 8: Severe 3v CAD; NL LVF  This represented severe progression since 2005, and he was referred for surgical evaluation. He underwent elective 4v CABG on April 22, with grafting of LIMA-LAD; SVG-RI; SVG-OM1; and SVG-PDA.  Postop course notable for PAF RVR, initially treated with IV amiodarone. He was discharged on 200 mg twice a day and low-dose ASA.  More recently, patient's daughter called our office concerned that her father was demonstrating low BP readings, as well as SOB/DOE. Orthostatic BPs were obtained, notable for drops in his SBP to 100 and 88, upon standing. Dr. Diona Browner was apprised of these results and recommended holding losartan. He also recommended further evaluation with a 2v CXR and an echocardiogram.   - CXR, May 28: Unchanged, small left-sided effusion and probable ATX; no evidence of edema  Clinically, he reports development of OH approximately 4 days after his release. He describes postural symptoms of dizziness, but no frank syncope. He denies any falls. As recently advised by Dr. Diona Browner, he has stopped taking losartan, now day 3 off this medication. He reports subsequent improvement in his BP.  He reports diminished appetite. He denies tachycardia palpitations.   Twelve-lead EKG today, reviewed by me, indicates NSR 77 bpm  Allergies  Allergen Reactions  . Ezetimibe Other (See Comments)    Reaction:muscle cramps  . Statins Other (See Comments)    Reaction:muscle cramps    Current Outpatient Prescriptions  Medication Sig Dispense Refill  . amiodarone (PACERONE)  200 MG tablet Take 1 tablet (200 mg total) by mouth daily.      . calcium-vitamin D (OSCAL WITH D) 500-200 MG-UNIT per tablet Take 1 tablet by mouth 2 (two) times daily.      Marland Kitchen docusate sodium (COLACE) 100 MG capsule Take 100 mg by mouth 2 (two) times daily.      . ergocalciferol (VITAMIN D2) 50000 UNITS capsule Take 50,000 Units by mouth once a week. Takes on Thursday      . esomeprazole (NEXIUM) 40 MG capsule Take 40 mg by mouth daily.        Marland Kitchen levothyroxine (SYNTHROID, LEVOTHROID) 75 MCG tablet Take 75 mcg by mouth daily.        Marland Kitchen LORazepam (ATIVAN) 0.5 MG tablet Take 0.5 mg by mouth every 8 (eight) hours as needed for anxiety.      . metFORMIN (GLUCOPHAGE) 500 MG tablet Take 500 mg by mouth 2 (two) times daily with a meal.      . metoprolol tartrate (LOPRESSOR) 25 MG tablet Take 0.5 tablets (12.5 mg total) by mouth 2 (two) times daily.      . Multiple Vitamin (MULTIVITAMIN) tablet Take 1 tablet by mouth daily.      Marland Kitchen NITROSTAT 0.4 MG SL tablet Take 1 tablet by mouth every 5 (five) minutes x 3 doses as needed.      . polyethylene glycol (MIRALAX / GLYCOLAX) packet Take 17 g by mouth daily.      . Tamsulosin HCl (FLOMAX) 0.4 MG CAPS Take 0.4 mg by mouth at bedtime.       . traMADol (ULTRAM) 50 MG tablet Take  1-2 tablets (50-100 mg total) by mouth every 4 (four) hours as needed for pain.  30 tablet  0  . vitamin C (ASCORBIC ACID) 500 MG tablet Take 500 mg by mouth daily. Ends 10/05/12      . zinc gluconate 50 MG tablet Take 50 mg by mouth daily. Ends 09/21/12      . aspirin 325 MG tablet Take 1 tablet (325 mg total) by mouth daily.      . furosemide (LASIX) 20 MG tablet Take 1 tablet (20 mg total) by mouth daily.  30 tablet  6  . losartan (COZAAR) 25 MG tablet Take 1 tablet (25 mg total) by mouth daily.  30 tablet  1   No current facility-administered medications for this visit.    Past Medical History  Diagnosis Date  . Coronary atherosclerosis of native coronary artery     4v CABG, 08/2012:  LIMA-LAD; SVG-RI; SVG-OM1; SVG-PD; DES to the OM and RCA 2005  . Dyslipidemia     unable to take meds for this bc of cramps  . Sinus bradycardia   . ED (erectile dysfunction)   . GERD (gastroesophageal reflux disease)     takes Nexium daily  . Essential hypertension, benign     takes Amlodipine and Losartan daily  . Hypothyroidism     takes Synthroid daily  . Enlarged prostate     takes Flomax daily  . Type 2 diabetes mellitus     takes Metformin daily  . History of bronchitis     last time several yrs ago  . Headache(784.0)     occasionally  . Tick bite 06/2012  . Carpal tunnel syndrome   . Arthritis   . Joint pain   . Joint swelling   . History of gout   . Urinary urgency   . Nocturia   . Anxiety     doesn't take any meds for this  . Orthostatic hypotension   . Paroxysmal atrial fibrillation     Past Surgical History  Procedure Laterality Date  . L4-5 laminectomy      x 2   . Cervical fusion    . Hernia repair    . Cardiac catheterization    . Coronary angioplasty      2 stents  . Coronary artery bypass graft N/A 08/30/2012    Procedure: CORONARY ARTERY BYPASS GRAFTING (CABG);  Surgeon: Kerin Perna, MD;  Location[facility];  Service: Open Heart Surgery;  Laterality: N/A;  . Intraoperative transesophageal echocardiogram N/A 08/30/2012    Procedure: INTRAOPERATIVE TRANSESOPHAGEAL ECHOCARDIOGRAM;  Surgeon: Kerin Perna, MD;  Location: [facility] OR;  Service: Open Heart Surgery;  Laterality: N/A;    History   Social History  . Marital Status: Single    Spouse Name: N/A    Number of Children: N/A  . Years of Education: N/A   Occupational History  . Not on file.   Social History Main Topics  . Smoking status: Former Smoker    Types: Cigarettes    Quit date: 05/11/1968  . Smokeless tobacco: Former Neurosurgeon    Types: Chew    Quit date: 05/11/1978  . Alcohol Use: No  . Drug Use: No  . Sexually Active: No   Other Topics Concern  . Not on file   Social History  Narrative  . No narrative on file    No family history on file.  ROS: no nausea, vomiting; no fever, chills; no melena, hematochezia; no claudication  PHYSICAL EXAM:  BP 110/76  Pulse 90  Ht 5\' 11"  (1.803 m)  Wt 202 lb (91.627 kg)  BMI 28.19 kg/m2  SpO2 97% GENERAL: 79 year-old male; NAD HEENT: NCAT, PERRLA, EOMI; sclera clear; no xanthelasma NECK: palpable bilateral carotid pulses, no bruits; no JVD; no TM LUNGS: CTA bilaterally, supine CARDIAC: RRR (S1, S2); no significant murmurs; no rubs or gallops ABDOMEN: soft, non-tender; intact BS EXTREMETIES: intact distal pulses; no significant peripheral edema SKIN: warm/dry; well-healed midline scar MUSCULOSKELETAL: no joint deformity NEURO: no focal deficit; NL affect   EKG: reviewed and available in Electronic Records   ASSESSMENT & PLAN:  Coronary atherosclerosis of native coronary artery Stable, status post recent 4V CABG. NL LVF. Will increase ASA to 325 mg daily.   Paroxysmal atrial fibrillation Maintaining NSR, as confirmed by EKG. Of note, patient reports that he has been taking amiodarone at the reduced dose of 200 mg daily, with instructions to stop altogether on June 1. In light of this, therefore, I have elected to continue beta blocker therapy as antiarrhythmic therapy, although he was not on this medication prior to undergoing surgery. In light of his OH, however, we will reduce this to 12.5 twice a day. This then can be consolidated to Toprol-XL 25 mg daily at time of his next OV, if BP remains stable. Also of note, he has a CHADS score of 3: (DM, HTN, age). If he demonstrates recurrent AF, then recommendation would be to initiate long-term anticoagulation, given his increased thromboembolic risk. In the meanwhile, we will increase ASA to full dose.  Orthostatic hypotension Orthostatic BPs were obtained today, indicating no significant drop in his BP or rise in HR. This suggests improvement, following recent  discontinuation of Cozaar and Lasix. A 2-D echocardiogram was also recommended, completed this morning with results currently pending. Regarding medications, I recommend that he not resume losartan, and I will decrease Lasix to 20 mg daily, in light of noted small pleural effusion by recent followup CXR. Also, as outlined above, Lopressor dose to be reduced, as well. Once his BP normalizes, he may be either placed back on his ARB, or started on an ACE inhibitor for treatment of HTN, in the context of underlying DM.    Gene Martice Doty, PAC

## 2012-10-06 NOTE — Assessment & Plan Note (Addendum)
Maintaining NSR, as confirmed by EKG. Of note, patient reports that he has been taking amiodarone at the reduced dose of 200 mg daily, with instructions to stop altogether on June 1. In light of this, therefore, I have elected to continue beta blocker therapy as antiarrhythmic therapy, although he was not on this medication prior to undergoing surgery. In light of his OH, however, we will reduce this to 12.5 twice a day. This then can be consolidated to Toprol-XL 25 mg daily at time of his next OV, if BP remains stable. Also of note, he has a CHADS score of 3: (DM, HTN, age). If he demonstrates recurrent AF, then recommendation would be to initiate long-term anticoagulation, given his increased thromboembolic risk. In the meanwhile, we will increase ASA to full dose.

## 2012-10-06 NOTE — Assessment & Plan Note (Addendum)
Orthostatic BPs were obtained today, indicating no significant drop in his BP or rise in HR. This suggests improvement, following recent discontinuation of Cozaar and Lasix. A 2-D echocardiogram was also recommended, completed this morning with results currently pending. Regarding medications, I recommend that he not resume losartan, and I will decrease Lasix to 20 mg daily, in light of noted small pleural effusion by recent followup CXR. Also, as outlined above, Lopressor dose to be reduced, as well. Once his BP normalizes, he may be either placed back on his ARB, or started on an ACE inhibitor for treatment of HTN, in the context of underlying DM.

## 2012-10-06 NOTE — Assessment & Plan Note (Addendum)
Stable, status post recent 4V CABG. NL LVF. Will increase ASA to 325 mg daily.

## 2012-10-06 NOTE — Patient Instructions (Addendum)
   Continue Amiodarone 200mg  daily, then stop June 1 as directed by Dr. Alla German  Increase Aspirin to 325mg  daily  Decrease Lasix to 20mg  daily - new sent to pharm Decrease Lopressor to 12.5mg  twice a day  Continue all other current medications. Follow up in  1 month

## 2012-10-13 ENCOUNTER — Ambulatory Visit: Payer: Medicare Other | Admitting: Cardiology

## 2012-10-14 ENCOUNTER — Telehealth: Payer: Self-pay | Admitting: *Deleted

## 2012-10-14 NOTE — Telephone Encounter (Signed)
Message copied by Eustace Moore on Fri Oct 14, 2012 11:24 AM ------      Message from: MCDOWELL, Illene Bolus      Created: Wed Oct 12, 2012  3:11 PM       Reviewed. LVEF in the low normal range, postoperative setting. Small pericardial effusion noted, however not likely of hemodynamic significance. Continue medical therapy - patient was recently seen in the office by [name] PA-C. ------

## 2012-10-14 NOTE — Telephone Encounter (Signed)
Patient informed. 

## 2012-10-17 ENCOUNTER — Other Ambulatory Visit: Payer: Self-pay | Admitting: *Deleted

## 2012-10-17 DIAGNOSIS — I2510 Atherosclerotic heart disease of native coronary artery without angina pectoris: Secondary | ICD-10-CM

## 2012-10-18 ENCOUNTER — Ambulatory Visit (INDEPENDENT_AMBULATORY_CARE_PROVIDER_SITE_OTHER): Payer: Medicare Other | Admitting: Cardiothoracic Surgery

## 2012-10-18 ENCOUNTER — Encounter: Payer: Self-pay | Admitting: Cardiothoracic Surgery

## 2012-10-18 ENCOUNTER — Ambulatory Visit
Admission: RE | Admit: 2012-10-18 | Discharge: 2012-10-18 | Disposition: A | Discharge status: Home / Self Care | Payer: Medicare Other | Source: Ambulatory Visit | Attending: Cardiothoracic Surgery | Admitting: Cardiothoracic Surgery

## 2012-10-18 VITALS — BP 128/75 | HR 73 | Resp 20 | Ht 71.0 in | Wt 203.0 lb

## 2012-10-18 DIAGNOSIS — I2510 Atherosclerotic heart disease of native coronary artery without angina pectoris: Secondary | ICD-10-CM

## 2012-10-18 DIAGNOSIS — Z951 Presence of aortocoronary bypass graft: Secondary | ICD-10-CM

## 2012-10-18 NOTE — Progress Notes (Signed)
PCP is Juliette Alcide, MD Referring Provider is Jonelle Sidle, MD  Chief Complaint  Patient presents with  . Routine Post Op    4 week f/u from surgery with CXR, S/P CABG x4 on 08/30/12    HPI: 79 year old gentleman status post CABG x4 for spinal 2 month followup. On his first office visit he had a mild left pleural effusion and has been given a course of oral Lasix with improvement of his symptoms and chest x-ray findings. He is maintained sinus rhythm and is walking 2 miles daily. He has no recurrent angina and the surgical incisions are healing well   Past Medical History  Diagnosis Date  . Coronary atherosclerosis of native coronary artery     4v CABG, 08/2012: LIMA-LAD; SVG-RI; SVG-OM1; SVG-PD; DES to the OM and RCA 2005  . Dyslipidemia     unable to take meds for this bc of cramps  . Sinus bradycardia   . ED (erectile dysfunction)   . GERD (gastroesophageal reflux disease)     takes Nexium daily  . Essential hypertension, benign     takes Amlodipine and Losartan daily  . Hypothyroidism     takes Synthroid daily  . Enlarged prostate     takes Flomax daily  . Type 2 diabetes mellitus     takes Metformin daily  . History of bronchitis     last time several yrs ago  . Headache(784.0)     occasionally  . Tick bite 06/2012  . Carpal tunnel syndrome   . Arthritis   . Joint pain   . Joint swelling   . History of gout   . Urinary urgency   . Nocturia   . Anxiety     doesn't take any meds for this  . Orthostatic hypotension   . Paroxysmal atrial fibrillation     Past Surgical History  Procedure Laterality Date  . L4-5 laminectomy      x 2   . Cervical fusion    . Hernia repair    . Cardiac catheterization    . Coronary angioplasty      2 stents  . Coronary artery bypass graft N/A 08/30/2012    Procedure: CORONARY ARTERY BYPASS GRAFTING (CABG);  Surgeon: Kerin Perna, MD;  Location: [facility] OR;  Service: Open Heart Surgery;  Laterality: N/A;  . Intraoperative  transesophageal echocardiogram N/A 08/30/2012    Procedure: INTRAOPERATIVE TRANSESOPHAGEAL ECHOCARDIOGRAM;  Surgeon: Kerin Perna, MD;  Location: [facility] OR;  Service: Open Heart Surgery;  Laterality: N/A;    No family history on file.  Social History History  Substance Use Topics  . Smoking status: Former Smoker    Types: Cigarettes    Quit date: 05/11/1968  . Smokeless tobacco: Former Neurosurgeon    Types: Chew    Quit date: 05/11/1978  . Alcohol Use: No    Current Outpatient Prescriptions  Medication Sig Dispense Refill  . aspirin 325 MG tablet Take 1 tablet (325 mg total) by mouth daily.      . calcium-vitamin D (OSCAL WITH D) 500-200 MG-UNIT per tablet Take 1 tablet by mouth 2 (two) times daily.      Marland Kitchen docusate sodium (COLACE) 100 MG capsule Take 100 mg by mouth 2 (two) times daily.      . ergocalciferol (VITAMIN D2) 50000 UNITS capsule Take 50,000 Units by mouth once a week. Takes on Thursday      . esomeprazole (NEXIUM) 40 MG capsule Take 40 mg by mouth daily.        Marland Kitchen  furosemide (LASIX) 20 MG tablet Take 1 tablet (20 mg total) by mouth daily.  30 tablet  6  . levothyroxine (SYNTHROID, LEVOTHROID) 75 MCG tablet Take 75 mcg by mouth daily.        Marland Kitchen LORazepam (ATIVAN) 0.5 MG tablet Take 0.5 mg by mouth every 8 (eight) hours as needed for anxiety.      . metFORMIN (GLUCOPHAGE) 500 MG tablet Take 500 mg by mouth 2 (two) times daily with a meal.      . metoprolol tartrate (LOPRESSOR) 25 MG tablet Take 0.5 tablets (12.5 mg total) by mouth 2 (two) times daily.      . Multiple Vitamin (MULTIVITAMIN) tablet Take 1 tablet by mouth daily.      Marland Kitchen NITROSTAT 0.4 MG SL tablet Take 1 tablet by mouth every 5 (five) minutes x 3 doses as needed.      . polyethylene glycol (MIRALAX / GLYCOLAX) packet Take 17 g by mouth daily.      . Tamsulosin HCl (FLOMAX) 0.4 MG CAPS Take 0.4 mg by mouth at bedtime.       . traMADol (ULTRAM) 50 MG tablet Take 1-2 tablets (50-100 mg total) by mouth every 4 (four) hours as  needed for pain.  30 tablet  0  . vitamin C (ASCORBIC ACID) 500 MG tablet Take 500 mg by mouth daily. Ends 10/05/12      . zinc gluconate 50 MG tablet Take 50 mg by mouth daily. Ends 09/21/12       No current facility-administered medications for this visit.    Allergies  Allergen Reactions  . Ezetimibe Other (See Comments)    Reaction:muscle cramps  . Statins Other (See Comments)    Reaction:muscle cramps    Review of Systems improved appetite and overall strength No fever no edema no symptoms of angina or CHF  BP 128/75  Pulse 73  Resp 20  Ht 5\' 11"  (1.803 m)  Wt 203 lb (92.08 kg)  BMI 28.33 kg/m2  SpO2 97% Physical Exam Alert and comfortable Lungs clear Heart rhythm regular without murmur No pedal edema Incisions clean and dry  Diagnostic Tests: Chest x-ray shows resolution of left pleural effusion, mild elevation of left hemidiaphragm.  Impression: Continue current medications and activities Patient understands not to lift more than 20 pounds until 3 months after surgery Plan: Return as needed

## 2012-10-19 ENCOUNTER — Ambulatory Visit: Payer: Medicare Other | Admitting: Cardiothoracic Surgery

## 2012-11-10 ENCOUNTER — Ambulatory Visit: Payer: Medicare Other | Admitting: Cardiology

## 2012-11-10 ENCOUNTER — Ambulatory Visit (INDEPENDENT_AMBULATORY_CARE_PROVIDER_SITE_OTHER): Payer: Medicare Other | Admitting: Cardiology

## 2012-11-10 ENCOUNTER — Encounter: Payer: Self-pay | Admitting: Cardiology

## 2012-11-10 VITALS — BP 145/75 | HR 60 | Ht 71.5 in | Wt 210.8 lb

## 2012-11-10 DIAGNOSIS — I480 Paroxysmal atrial fibrillation: Secondary | ICD-10-CM

## 2012-11-10 DIAGNOSIS — I10 Essential (primary) hypertension: Secondary | ICD-10-CM

## 2012-11-10 DIAGNOSIS — I2510 Atherosclerotic heart disease of native coronary artery without angina pectoris: Secondary | ICD-10-CM

## 2012-11-10 DIAGNOSIS — I4891 Unspecified atrial fibrillation: Secondary | ICD-10-CM

## 2012-11-10 MED ORDER — METOPROLOL TARTRATE 25 MG PO TABS: 25.0000 mg | ORAL_TABLET | Freq: Two times a day (BID) | ORAL | Status: DC

## 2012-11-10 MED ORDER — LOSARTAN POTASSIUM 25 MG PO TABS: 25.0000 mg | ORAL_TABLET | Freq: Every day | ORAL | Status: DC

## 2012-11-10 NOTE — Assessment & Plan Note (Signed)
No obvious recurrences, this was noted postoperatively. Holding off on anticoagulation unless this becomes a recurring issue.

## 2012-11-10 NOTE — Assessment & Plan Note (Signed)
We will add Cozaar 25 mg back to his regimen, once daily in the evenings. Stay off Lasix for now.

## 2012-11-10 NOTE — Progress Notes (Signed)
Clinical Summary Adam Peck is a 79 y.o.male last seen in the office by [name] PA-C in late May. Records reviewed. He also had a recent followup visit with Dr. Donata Peck. He seems to be doing recently well. He tells that he has been walking regularly, anywhere between 2-4 miles. No unusual chest pain symptoms. Dizziness has resolved, and unfortunately blood pressure is creeping back up. He states that systolics are running in the 140s to 160s in home. He had previously been taken off Lasix and Cozaar.   Allergies  Allergen Reactions  . Ezetimibe Other (See Comments)    Reaction:muscle cramps  . Statins Other (See Comments)    Reaction:muscle cramps    Current Outpatient Prescriptions  Medication Sig Dispense Refill  . calcium-vitamin D (OSCAL WITH D) 500-200 MG-UNIT per tablet Take 1 tablet by mouth 2 (two) times daily.      Marland Kitchen docusate sodium (COLACE) 100 MG capsule Take 100 mg by mouth 2 (two) times daily.      . ergocalciferol (VITAMIN D2) 50000 UNITS capsule Take 50,000 Units by mouth once a week. Takes on Thursday      . esomeprazole (NEXIUM) 40 MG capsule Take 40 mg by mouth daily.        Marland Kitchen levothyroxine (SYNTHROID, LEVOTHROID) 75 MCG tablet Take 75 mcg by mouth daily.        Marland Kitchen LORazepam (ATIVAN) 0.5 MG tablet Take 0.5 mg by mouth every 8 (eight) hours as needed for anxiety.      . metFORMIN (GLUCOPHAGE) 500 MG tablet Take 500 mg by mouth 2 (two) times daily with a meal.      . metoprolol tartrate (LOPRESSOR) 25 MG tablet Take 1 tablet (25 mg total) by mouth 2 (two) times daily.  180 tablet  3  . Multiple Vitamin (MULTIVITAMIN) tablet Take 1 tablet by mouth daily.      Marland Kitchen NITROSTAT 0.4 MG SL tablet Take 1 tablet by mouth every 5 (five) minutes x 3 doses as needed.      . polyethylene glycol (MIRALAX / GLYCOLAX) packet Take 17 g by mouth daily.      . Tamsulosin HCl (FLOMAX) 0.4 MG CAPS Take 0.4 mg by mouth at bedtime.       . traMADol (ULTRAM) 50 MG tablet Take 1-2 tablets (50-100  mg total) by mouth every 4 (four) hours as needed for pain.  30 tablet  0  . vitamin C (ASCORBIC ACID) 500 MG tablet Take 500 mg by mouth daily. Ends 10/05/12      . zinc gluconate 50 MG tablet Take 50 mg by mouth daily. Ends 09/21/12      . aspirin 325 MG tablet Take 1 tablet (325 mg total) by mouth daily.      Marland Kitchen losartan (COZAAR) 25 MG tablet Take 1 tablet (25 mg total) by mouth daily.  90 tablet  3   No current facility-administered medications for this visit.    Past Medical History  Diagnosis Date  . Coronary atherosclerosis of native coronary artery     CABG 08/2012: LIMA-LAD; SVG-RI; SVG-OM1; SVG-PD; DES to the OM and RCA 2005  . Mixed hyperlipidemia     Intolerant to statins  . Sinus bradycardia   . ED (erectile dysfunction)   . GERD (gastroesophageal reflux disease)   . Essential hypertension, benign   . Hypothyroidism   . Enlarged prostate   . Type 2 diabetes mellitus   . Headache(784.0)   . Tick bite 06/2012  .  Carpal tunnel syndrome   . Arthritis   . History of gout   . Anxiety   . Orthostatic hypotension   . Paroxysmal atrial fibrillation     Past Surgical History  Procedure Laterality Date  . L4-5 laminectomy      x 2   . Cervical fusion    . Hernia repair    . Cardiac catheterization    . Coronary artery bypass graft N/A 08/30/2012    Procedure: CORONARY ARTERY BYPASS GRAFTING (CABG);  Surgeon: Kerin Perna, MD;  Location: [facility] OR;  Service: Open Heart Surgery;  Laterality: N/A;  . Intraoperative transesophageal echocardiogram N/A 08/30/2012    Procedure: INTRAOPERATIVE TRANSESOPHAGEAL ECHOCARDIOGRAM;  Surgeon: Kerin Perna, MD;  Location: [facility] OR;  Service: Open Heart Surgery;  Laterality: N/A;    Social History Adam Peck reports that he quit smoking about 44 years ago. His smoking use included Cigarettes. He smoked 0.00 packs per day. He quit smokeless tobacco use about 34 years ago. His smokeless tobacco use included Chew. Adam Peck reports that he does  not drink alcohol.  Review of Systems As outlined above. Otherwise negative.  Physical Examination Filed Vitals:   11/10/12 1413  BP: 145/75  Pulse: 60   Filed Weights   11/10/12 1413  Weight: 210 lb 12.8 oz (95.618 kg)   Comfortable at rest. HEENT: Conjunctiva and lids normal, oropharynx clear. Neck: Supple, no elevated JVP, no thyromegaly. Lungs: Clear to auscultation, nonlabored breathing at rest. Thorax: Well-healed sternal incision Cardiac: Regular rate and rhythm, no S3, no pericardial rub. Abdomen: Soft, nontender, bowel sounds present. Extremities: No pitting edema, distal pulses 2+. Skin: Warm and dry. Musculoskeletal: No kyphosis. Neuropsychiatric: Alert and oriented x3, affect grossly appropriate.   Problem List and Plan   Coronary atherosclerosis of native coronary artery Symptomatically stable status post CABG. He has been walking regularly, gradually increasing his activities.  Essential hypertension, benign We will add Cozaar 25 mg back to his regimen, once daily in the evenings. Stay off Lasix for now.  Paroxysmal atrial fibrillation No obvious recurrences, this was noted postoperatively. Holding off on anticoagulation unless this becomes a recurring issue.    Jonelle Sidle, M.D., F.A.C.C.

## 2012-11-10 NOTE — Patient Instructions (Addendum)
Your physician recommends that you schedule a follow-up appointment in: 2 months. Your physician has recommended you make the following change in your medication: Start losartan 25 mg daily. Your new prescription has been sent to your pharmacy. All other medications will remain the same. Continue monitoring your blood pressures at home.

## 2012-11-10 NOTE — Assessment & Plan Note (Signed)
Symptomatically stable status post CABG. He has been walking regularly, gradually increasing his activities.

## 2013-01-11 ENCOUNTER — Encounter: Payer: Self-pay | Admitting: Cardiology

## 2013-01-11 ENCOUNTER — Ambulatory Visit (INDEPENDENT_AMBULATORY_CARE_PROVIDER_SITE_OTHER): Payer: Medicare Other | Admitting: Cardiology

## 2013-01-11 VITALS — BP 129/71 | HR 56 | Ht 71.5 in | Wt 210.1 lb

## 2013-01-11 DIAGNOSIS — I2510 Atherosclerotic heart disease of native coronary artery without angina pectoris: Secondary | ICD-10-CM

## 2013-01-11 DIAGNOSIS — E785 Hyperlipidemia, unspecified: Secondary | ICD-10-CM

## 2013-01-11 DIAGNOSIS — I10 Essential (primary) hypertension: Secondary | ICD-10-CM

## 2013-01-11 NOTE — Assessment & Plan Note (Signed)
Blood pressure control looks reasonably good today.

## 2013-01-11 NOTE — Patient Instructions (Addendum)

## 2013-01-11 NOTE — Assessment & Plan Note (Signed)
Doing well status post CABG earlier in the year. Continue regular exercise and medical therapy. Followup arranged.

## 2013-01-11 NOTE — Assessment & Plan Note (Signed)
Patient has been intolerant of statins.

## 2013-01-11 NOTE — Progress Notes (Signed)
Clinical Summary Mr. Agosto is a 79 y.o.male last seen in July of this year. He states that he has been steadily gaining energy. Still walking for exercise, also doing some outdoor work chores. Avoiding heavy lifting.  He reports compliance with his medications, reviewed below. Has also had followup with Dr. Leandrew Koyanagi.   Allergies  Allergen Reactions  . Ezetimibe Other (See Comments)    Reaction:muscle cramps  . Statins Other (See Comments)    Reaction:muscle cramps    Current Outpatient Prescriptions  Medication Sig Dispense Refill  . amLODipine (NORVASC) 5 MG tablet Take 5 mg by mouth daily.      Marland Kitchen aspirin 325 MG tablet Take 1 tablet (325 mg total) by mouth daily.      . calcium-vitamin D (OSCAL WITH D) 500-200 MG-UNIT per tablet Take 1 tablet by mouth 2 (two) times daily.      Marland Kitchen docusate sodium (COLACE) 100 MG capsule Take 100 mg by mouth 2 (two) times daily.      Marland Kitchen esomeprazole (NEXIUM) 40 MG capsule Take 40 mg by mouth daily.        Marland Kitchen levothyroxine (SYNTHROID, LEVOTHROID) 75 MCG tablet Take 75 mcg by mouth daily.        Marland Kitchen losartan (COZAAR) 25 MG tablet Take 1 tablet (25 mg total) by mouth daily.  90 tablet  3  . metFORMIN (GLUCOPHAGE) 500 MG tablet Take 500 mg by mouth 2 (two) times daily with a meal.      . metoprolol tartrate (LOPRESSOR) 25 MG tablet Take 1 tablet (25 mg total) by mouth 2 (two) times daily.  180 tablet  3  . Multiple Vitamin (MULTIVITAMIN) tablet Take 1 tablet by mouth daily.      Marland Kitchen NITROSTAT 0.4 MG SL tablet Take 1 tablet by mouth every 5 (five) minutes x 3 doses as needed.      . polyethylene glycol (MIRALAX / GLYCOLAX) packet Take 17 g by mouth as needed.       . Tamsulosin HCl (FLOMAX) 0.4 MG CAPS Take 0.4 mg by mouth at bedtime.        No current facility-administered medications for this visit.    Past Medical History  Diagnosis Date  . Coronary atherosclerosis of native coronary artery     CABG 08/2012: LIMA-LAD; SVG-RI; SVG-OM1; SVG-PD; DES to the  OM and RCA 2005  . Mixed hyperlipidemia     Intolerant to statins  . Sinus bradycardia   . ED (erectile dysfunction)   . GERD (gastroesophageal reflux disease)   . Essential hypertension, benign   . Hypothyroidism   . Enlarged prostate   . Type 2 diabetes mellitus   . Headache(784.0)   . Tick bite 06/2012  . Carpal tunnel syndrome   . Arthritis   . History of gout   . Anxiety   . Orthostatic hypotension   . Paroxysmal atrial fibrillation     Past Surgical History  Procedure Laterality Date  . L4-5 laminectomy      x 2   . Cervical fusion    . Hernia repair    . Cardiac catheterization    . Coronary artery bypass graft N/A 08/30/2012    Procedure: CORONARY ARTERY BYPASS GRAFTING (CABG);  Surgeon: Kerin Perna, MD;  Location: [facility] OR;  Service: Open Heart Surgery;  Laterality: N/A;  . Intraoperative transesophageal echocardiogram N/A 08/30/2012    Procedure: INTRAOPERATIVE TRANSESOPHAGEAL ECHOCARDIOGRAM;  Surgeon: Kerin Perna, MD;  Location: [facility] OR;  Service: Open  Heart Surgery;  Laterality: N/A;    Social History Mr. Gago reports that he quit smoking about 44 years ago. His smoking use included Cigarettes. He smoked 0.00 packs per day. He quit smokeless tobacco use about 34 years ago. His smokeless tobacco use included Chew. Mr. Spisak reports that he does not drink alcohol.  Review of Systems No palpitations, dizziness, bleeding episodes.  Physical Examination Filed Vitals:   01/11/13 1506  BP: 129/71  Pulse: 56   Filed Weights   01/11/13 1506  Weight: 210 lb 1.9 oz (95.31 kg)    Comfortable at rest.  HEENT: Conjunctiva and lids normal, oropharynx clear.  Neck: Supple, no elevated JVP, no thyromegaly.  Lungs: Clear to auscultation, nonlabored breathing at rest.  Thorax: Well-healed sternal incision  Cardiac: Regular rate and rhythm, no S3, no pericardial rub.  Abdomen: Soft, nontender, bowel sounds present.  Extremities: No pitting edema, distal pulses 2+.      Problem List and Plan   Coronary atherosclerosis of native coronary artery Doing well status post CABG earlier in the year. Continue regular exercise and medical therapy. Followup arranged.  DYSLIPIDEMIA Patient has been intolerant of statins.  Essential hypertension, benign Blood pressure control looks reasonably good today.    Jonelle Sidle, M.D., F.A.C.C.

## 2013-07-17 ENCOUNTER — Ambulatory Visit (INDEPENDENT_AMBULATORY_CARE_PROVIDER_SITE_OTHER): Payer: Medicare Other | Admitting: Cardiology

## 2013-07-17 ENCOUNTER — Encounter: Payer: Self-pay | Admitting: Cardiology

## 2013-07-17 VITALS — BP 171/90 | HR 47 | Ht 70.5 in | Wt 220.0 lb

## 2013-07-17 DIAGNOSIS — I10 Essential (primary) hypertension: Secondary | ICD-10-CM

## 2013-07-17 DIAGNOSIS — I480 Paroxysmal atrial fibrillation: Secondary | ICD-10-CM

## 2013-07-17 DIAGNOSIS — I2510 Atherosclerotic heart disease of native coronary artery without angina pectoris: Secondary | ICD-10-CM

## 2013-07-17 DIAGNOSIS — I4891 Unspecified atrial fibrillation: Secondary | ICD-10-CM

## 2013-07-17 DIAGNOSIS — E785 Hyperlipidemia, unspecified: Secondary | ICD-10-CM

## 2013-07-17 NOTE — Assessment & Plan Note (Signed)
No active angina symptoms. Continue medical therapy. Recommended continued efforts at walking regimen, watching sodium in the diet. He reports compliance with medications.

## 2013-07-17 NOTE — Assessment & Plan Note (Signed)
Postoperative, none documented since that time. He is not on anticoagulation.

## 2013-07-17 NOTE — Assessment & Plan Note (Signed)
History of statin intolerance. 

## 2013-07-17 NOTE — Assessment & Plan Note (Signed)
Blood pressure elevated today. We reviewed his medications, discussed sodium restriction and walking regimen. I recommended that he keep a log of blood pressure at home prior to his next visit with Dr. Pleas Koch. May need further medication adjustments, would consider increasing Cozaar further if tolerated.

## 2013-07-17 NOTE — Progress Notes (Signed)
Clinical Summary Adam Peck is a 80 y.o.male last seen in September 2014. He reports interval followup with Adam Peck. No anginal chest pain, has had some reflux. Stable dyspnea on exertion.  Echocardiogram from May 2014 showed mild LVH with LVEF 50-55%, anteroseptal hypokinesis, grade 1 diastolic dysfunction, mildly sclerotic aortic valve with mild aortic regurgitation, mild left atrial enlargement, mildly reduced RV contraction, PASP not estimated, small pericardial effusion.  Blood pressure is elevated today. He tells me that home systolic blood pressure checks are sometimes in the 150s and 160s. He reports compliance with his medications, has not been walking as regularly. We discussed sodium restriction, and I asked him to keep a log of his blood pressure at home prior to his next visit with Adam Peck. He may need further medication adjustments.   Allergies  Allergen Reactions  . Ezetimibe Other (See Comments)    Reaction:muscle cramps  . Statins Other (See Comments)    Reaction:muscle cramps    Current Outpatient Prescriptions  Medication Sig Dispense Refill  . amLODipine (NORVASC) 5 MG tablet Take 5 mg by mouth daily.      Marland Kitchen aspirin 325 MG tablet Take 1 tablet (325 mg total) by mouth daily.      . calcium-vitamin D (OSCAL WITH D) 500-200 MG-UNIT per tablet Take 1 tablet by mouth 2 (two) times daily.      Marland Kitchen esomeprazole (NEXIUM) 40 MG capsule Take 40 mg by mouth daily.        . fluticasone (FLONASE) 50 MCG/ACT nasal spray Place 2 sprays into both nostrils as needed.      Marland Kitchen HYDROcodone-acetaminophen (NORCO) 7.5-325 MG per tablet Take 1 tablet by mouth as needed.      Marland Kitchen levothyroxine (SYNTHROID, LEVOTHROID) 75 MCG tablet Take 75 mcg by mouth daily.        Marland Kitchen losartan (COZAAR) 25 MG tablet Take 1 tablet (25 mg total) by mouth daily.  90 tablet  3  . metFORMIN (GLUCOPHAGE) 500 MG tablet Take 500 mg by mouth 2 (two) times daily with a meal.      . metoprolol tartrate (LOPRESSOR)  25 MG tablet Take 1 tablet (25 mg total) by mouth 2 (two) times daily.  180 tablet  3  . NITROSTAT 0.4 MG SL tablet Take 1 tablet by mouth every 5 (five) minutes x 3 doses as needed.      . polyethylene glycol (MIRALAX / GLYCOLAX) packet Take 17 g by mouth as needed.       . Tamsulosin HCl (FLOMAX) 0.4 MG CAPS Take 0.4 mg by mouth at bedtime.        No current facility-administered medications for this visit.    Past Medical History  Diagnosis Date  . Coronary atherosclerosis of native coronary artery     CABG 08/2012: LIMA-LAD; SVG-RI; SVG-OM1; SVG-PD; DES to the OM and RCA 2005  . Mixed hyperlipidemia     Intolerant to statins  . Sinus bradycardia   . ED (erectile dysfunction)   . GERD (gastroesophageal reflux disease)   . Essential hypertension, benign   . Hypothyroidism   . Enlarged prostate   . Type 2 diabetes mellitus   . Headache(784.0)   . Tick bite 06/2012  . Carpal tunnel syndrome   . Arthritis   . History of gout   . Anxiety   . Orthostatic hypotension   . Paroxysmal atrial fibrillation     Postoperative    Past Surgical History  Procedure Laterality Date  .  L4-5 laminectomy      x 2   . Cervical fusion    . Hernia repair    . Cardiac catheterization    . Coronary artery bypass graft N/A 08/30/2012    Procedure: CORONARY ARTERY BYPASS GRAFTING (CABG);  Surgeon: Adam Poot, MD;  Location: [facility];  Service: Open Heart Surgery;  Laterality: N/A;  . Intraoperative transesophageal echocardiogram N/A 08/30/2012    Procedure: INTRAOPERATIVE TRANSESOPHAGEAL ECHOCARDIOGRAM;  Surgeon: Adam Poot, MD;  Location: [place];  Service: Open Heart Surgery;  Laterality: N/A;    Social History Adam Peck reports that he quit smoking about 45 years ago. His smoking use included Cigarettes. He smoked 0.00 packs per day. He quit smokeless tobacco use about 35 years ago. His smokeless tobacco use included Chew. Adam Peck reports that he does not drink alcohol.  Review of  Systems No palpitations or bleeding problems. Stable appetite. No orthopnea or PND. Otherwise as outlined.  Physical Examination Filed Vitals:   07/17/13 0816  BP: 171/90  Pulse: 47   Filed Weights   07/17/13 0816  Weight: 220 lb (99.791 kg)    Comfortable at rest.  HEENT: Conjunctiva and lids normal, oropharynx clear.  Neck: Supple, no elevated JVP, no thyromegaly.  Lungs: Clear to auscultation, nonlabored breathing at rest.  Thorax: Well-healed sternal incision  Cardiac: Regular rate and rhythm, no S3, no pericardial rub.  Abdomen: Soft, nontender, bowel sounds present.  Extremities: No pitting edema, distal pulses 2+.    Problem List and Plan   Coronary atherosclerosis of native coronary artery No active angina symptoms. Continue medical therapy. Recommended continued efforts at walking regimen, watching sodium in the diet. He reports compliance with medications.  Essential hypertension, benign Blood pressure elevated today. We reviewed his medications, discussed sodium restriction and walking regimen. I recommended that he keep a log of blood pressure at home prior to his next visit with Adam Peck. May need further medication adjustments, would consider increasing Cozaar further if tolerated.  DYSLIPIDEMIA History of statin intolerance.  Paroxysmal atrial fibrillation Postoperative, none documented since that time. He is not on anticoagulation.    Satira Sark, M.D., F.A.C.C.

## 2013-07-17 NOTE — Patient Instructions (Signed)
Continue all current medications. Your physician wants you to follow up in: 6 months.  You will receive a reminder letter in the mail one-two months in advance.  If you don't receive a letter, please call our office to schedule the follow up appointment   

## 2013-11-09 IMAGING — CR DG CHEST 2V
2 series · 2 of 2 positions shown · non-contrast
Comparison: Chest x-ray of 09/03/2012 of

CLINICAL DATA: Post CABG and 12/12/2012, some shortness of breath,
follow-up

CHEST - 2 VIEW

[w chest lat]
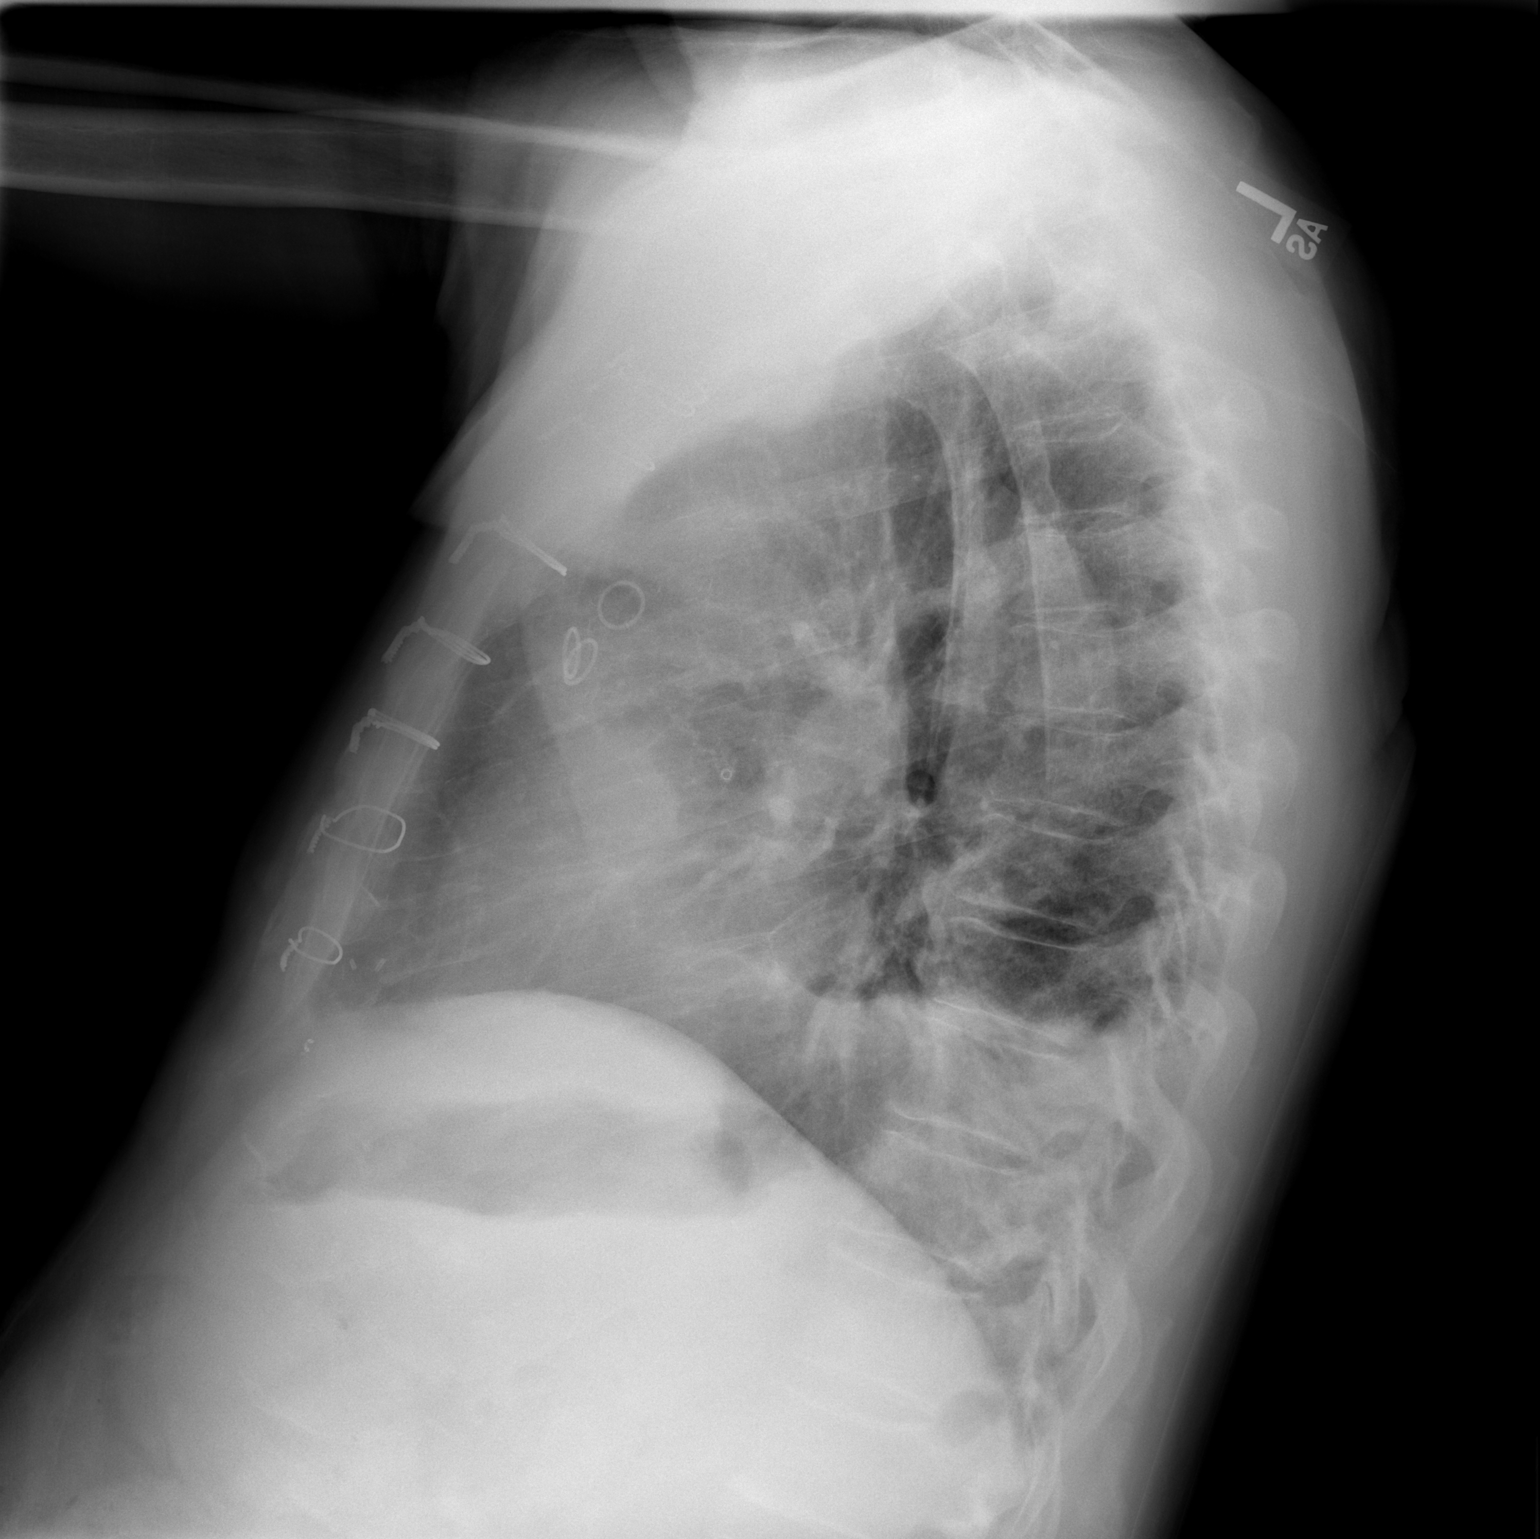

[w chest ap]
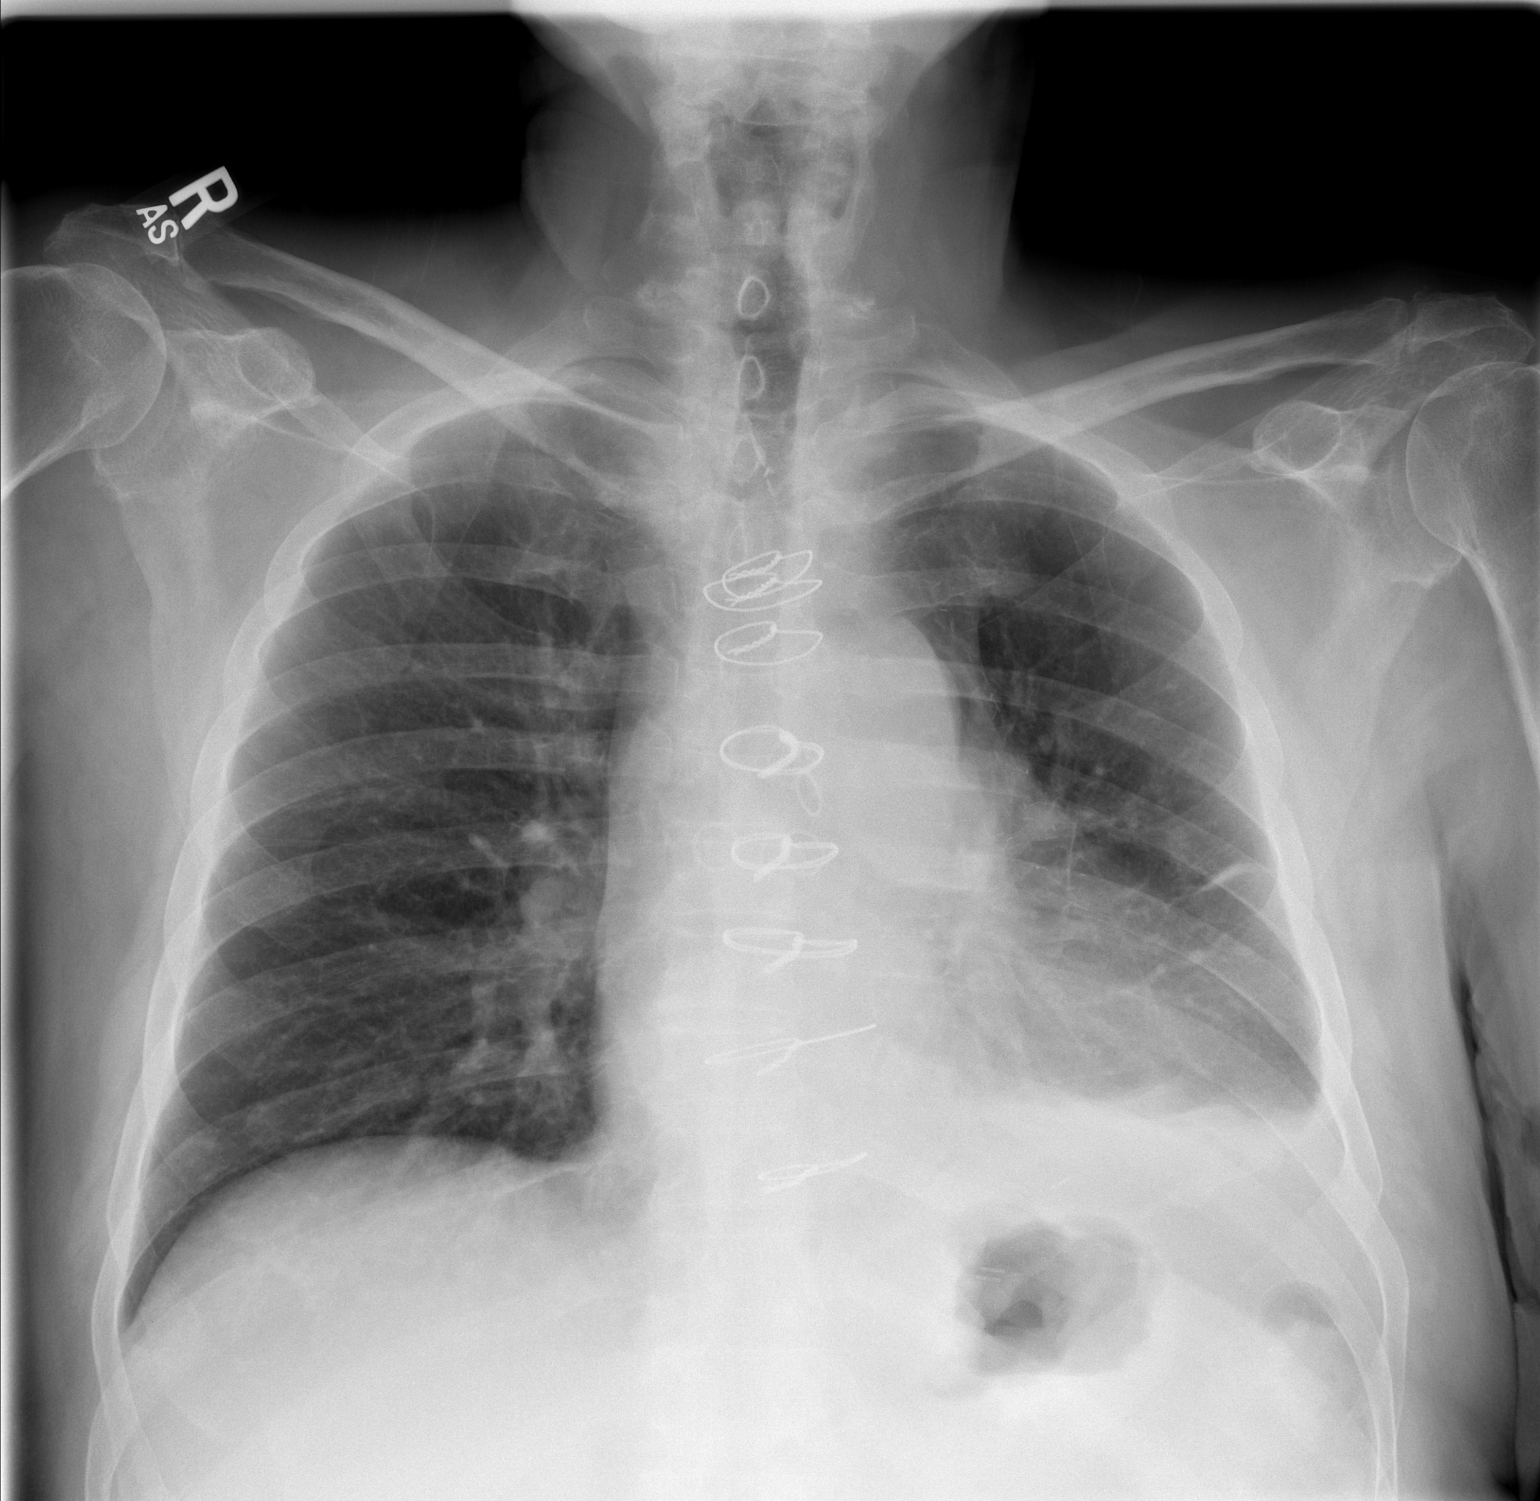

[2 of 2 positions shown; findings below may reference images not displayed]

FINDINGS: Aeration of the lungs has improved.  However, there is
now a slightly larger left pleural effusion present with left
basilar atelectasis.  The right lung is clear.  Cardiomegaly is
stable.
IMPRESSION: Slight increase in volume of left pleural effusion with left
basilar atelectasis.

## 2013-12-05 ENCOUNTER — Other Ambulatory Visit: Payer: Self-pay | Admitting: *Deleted

## 2013-12-05 MED ORDER — METOPROLOL TARTRATE 25 MG PO TABS: 25.0000 mg | ORAL_TABLET | Freq: Two times a day (BID) | ORAL | Status: DC

## 2014-01-24 ENCOUNTER — Ambulatory Visit (INDEPENDENT_AMBULATORY_CARE_PROVIDER_SITE_OTHER): Payer: Medicare Other | Admitting: Cardiology

## 2014-01-24 ENCOUNTER — Encounter: Payer: Self-pay | Admitting: Cardiology

## 2014-01-24 VITALS — BP 157/59 | HR 47 | Ht 70.0 in | Wt 219.0 lb

## 2014-01-24 DIAGNOSIS — I2510 Atherosclerotic heart disease of native coronary artery without angina pectoris: Secondary | ICD-10-CM

## 2014-01-24 DIAGNOSIS — E785 Hyperlipidemia, unspecified: Secondary | ICD-10-CM

## 2014-01-24 DIAGNOSIS — I10 Essential (primary) hypertension: Secondary | ICD-10-CM

## 2014-01-24 NOTE — Patient Instructions (Signed)

## 2014-01-24 NOTE — Assessment & Plan Note (Signed)
Symptomatically stable on medical therapy. ECG reviewed. No changes made to current medical regimen. Followup in 6 months.

## 2014-01-24 NOTE — Assessment & Plan Note (Signed)
Follow with Dr. Pleas Koch. Patient has a known statin intolerance.

## 2014-01-24 NOTE — Assessment & Plan Note (Signed)
I did not change any of his medications today. He did not tolerate Cozaar 100 mg daily, although I suspect he might at 50 mg daily if his blood pressure trend continues to increase.

## 2014-01-24 NOTE — Progress Notes (Signed)
Clinical Summary [name] is a 80 y.o.male last seen in March. He reports no angina symptoms and stable shortness of breath. He tells me that his systolic blood pressure is generally in the 150s at home. States that Cozaar was advanced to 100 mg daily since I saw him. He was unable to tolerate this due to relative hypotension and weakness. He sees Dr. Pleas Koch back in November.  ECG today shows sinus bradycardia with poor R wave progression.  Echocardiogram from May 2014 showed mild LVH with LVEF 50-55%, anteroseptal hypokinesis, grade 1 diastolic dysfunction, mildly sclerotic aortic valve with mild aortic regurgitation, mild left atrial enlargement, mildly reduced RV contraction, PASP not estimated, small pericardial effusion.  Exercise Cardiolite from March 2014 showed no diagnostic ST segment changes with overall poor exercise tolerance, frequent PACs and PVCs, LVEF 56%, ischemia noted within the mid to basal inferior wall. He has been managed medically.   Allergies  Allergen Reactions  . Ezetimibe Other (See Comments)    Reaction:muscle cramps  . Statins Other (See Comments)    Reaction:muscle cramps    Current Outpatient Prescriptions  Medication Sig Dispense Refill  . amLODipine (NORVASC) 5 MG tablet Take 5 mg by mouth daily.      Marland Kitchen aspirin 325 MG tablet Take 1 tablet (325 mg total) by mouth daily.      . calcium-vitamin D (OSCAL WITH D) 500-200 MG-UNIT per tablet Take 1 tablet by mouth 2 (two) times daily.      Marland Kitchen esomeprazole (NEXIUM) 40 MG capsule Take 40 mg by mouth daily.        . fluticasone (FLONASE) 50 MCG/ACT nasal spray Place 2 sprays into both nostrils as needed.      Marland Kitchen HYDROcodone-acetaminophen (NORCO) 7.5-325 MG per tablet Take 1 tablet by mouth as needed.      Marland Kitchen levothyroxine (SYNTHROID, LEVOTHROID) 75 MCG tablet Take 75 mcg by mouth daily.        Marland Kitchen losartan (COZAAR) 25 MG tablet Take 1 tablet (25 mg total) by mouth daily.  90 tablet  3  . metFORMIN (GLUCOPHAGE)  500 MG tablet Take 500 mg by mouth 2 (two) times daily with a meal.      . metoprolol tartrate (LOPRESSOR) 25 MG tablet Take 1 tablet (25 mg total) by mouth 2 (two) times daily.  180 tablet  3  . NITROSTAT 0.4 MG SL tablet Take 1 tablet by mouth every 5 (five) minutes x 3 doses as needed.      . polyethylene glycol (MIRALAX / GLYCOLAX) packet Take 17 g by mouth as needed.       . Tamsulosin HCl (FLOMAX) 0.4 MG CAPS Take 0.4 mg by mouth at bedtime.        No current facility-administered medications for this visit.    Past Medical History  Diagnosis Date  . Coronary atherosclerosis of native coronary artery     CABG 08/2012: LIMA-LAD; SVG-RI; SVG-OM1; SVG-PD; DES to the OM and RCA 2005  . Mixed hyperlipidemia     Intolerant to statins  . Sinus bradycardia   . ED (erectile dysfunction)   . GERD (gastroesophageal reflux disease)   . Essential hypertension, benign   . Hypothyroidism   . Enlarged prostate   . Type 2 diabetes mellitus   . Headache(784.0)   . Tick bite 06/2012  . Carpal tunnel syndrome   . Arthritis   . History of gout   . Anxiety   . Orthostatic hypotension   .  Paroxysmal atrial fibrillation     Postoperative    Past Surgical History  Procedure Laterality Date  . L4-5 laminectomy      x 2   . Cervical fusion    . Hernia repair    . Cardiac catheterization    . Coronary artery bypass graft N/A 08/30/2012    Procedure: CORONARY ARTERY BYPASS GRAFTING (CABG);  Surgeon: Ivin Poot, MD;  Location: [facility];  Service: Open Heart Surgery;  Laterality: N/A;  . Intraoperative transesophageal echocardiogram N/A 08/30/2012    Procedure: INTRAOPERATIVE TRANSESOPHAGEAL ECHOCARDIOGRAM;  Surgeon: Ivin Poot, MD;  Location: [place] [place];  Service: Open Heart Surgery;  Laterality: N/A;    Social History [name] reports that he quit smoking about 45 years ago. His smoking use included Cigarettes. He smoked 0.00 packs per day. He quit smokeless tobacco use about 35 years ago. His  smokeless tobacco use included Chew. [name] reports that he does not drink alcohol.  Review of Systems Arthritic pains, neuropathy in his hands, stable appetite, no bleeding problems. Other systems reviewed are negative.  Physical Examination Filed Vitals:   01/24/14 1041  BP: 157/59  Pulse: 47   Filed Weights   01/24/14 1041  Weight: 219 lb (99.338 kg)    Comfortable at rest.  HEENT: Conjunctiva and lids normal, oropharynx clear.  Neck: Supple, no elevated JVP, no thyromegaly.  Lungs: Clear to auscultation, nonlabored breathing at rest.  Thorax: Well-healed sternal incision  Cardiac: Regular rate and rhythm, no S3, no pericardial rub.  Abdomen: Soft, nontender, bowel sounds present.  Extremities: No pitting edema, distal pulses 2+.    Problem List and Plan   Coronary atherosclerosis of native coronary artery Symptomatically stable on medical therapy. ECG reviewed. No changes made to current medical regimen. Followup in 6 months.  Essential hypertension, benign I did not change any of his medications today. He did not tolerate Cozaar 100 mg daily, although I suspect he might at 50 mg daily if his blood pressure trend continues to increase.  DYSLIPIDEMIA Follow with Dr. Pleas Koch. Patient has a known statin intolerance.    Satira Sark, M.D., F.A.C.C.

## 2014-05-14 DIAGNOSIS — H401410 Capsular glaucoma with pseudoexfoliation of lens, right eye, stage unspecified: Secondary | ICD-10-CM | POA: Diagnosis not present

## 2014-05-14 DIAGNOSIS — H40021 Open angle with borderline findings, high risk, right eye: Secondary | ICD-10-CM | POA: Diagnosis not present

## 2014-05-14 DIAGNOSIS — H2513 Age-related nuclear cataract, bilateral: Secondary | ICD-10-CM | POA: Diagnosis not present

## 2014-05-14 DIAGNOSIS — H11002 Unspecified pterygium of left eye: Secondary | ICD-10-CM | POA: Diagnosis not present

## 2014-05-14 DIAGNOSIS — H2512 Age-related nuclear cataract, left eye: Secondary | ICD-10-CM | POA: Diagnosis not present

## 2014-05-15 NOTE — Patient Instructions (Signed)
Adam Peck  05/15/2014   Your procedure is scheduled on:  05/21/2014  Report to [facility] at 7:30 AM.  Call this number if you have problems the morning of surgery: [phone]   Remember:   Do not eat food or drink liquids after midnight.   Take these medicines the morning of surgery with A SIP OF WATER: Amlodipine, Nexium, Flonase, Hydrocodone if needed, Losartan,  Metoprolol, Flomax   DO NOT TAKE DIABETIC MEDICATIONS AM OF PROCEDURE   Do not wear jewelry, make-up or nail polish.  Do not wear lotions, powders, or perfumes. You may wear deodorant.  Do not shave 48 hours prior to surgery. Men may shave face and neck.  Do not bring valuables to the hospital.  [facility] is not responsible                  for any belongings or valuables.               Contacts, dentures or bridgework may not be worn into surgery.  Leave suitcase in the car. After surgery it may be brought to your room.  For patients admitted to the hospital, discharge time is determined by your                treatment team.               Patients discharged the day of surgery will not be allowed to drive  home.  Name and phone number of your driver:  Special Instructions: N/A   Please read over the following fact sheets that you were given: Anesthesia Post-op Instructions   PATIENT INSTRUCTIONS POST-ANESTHESIA  IMMEDIATELY FOLLOWING SURGERY:  Do not drive or operate machinery for the first twenty four hours after surgery.  Do not make any important decisions for twenty four hours after surgery or while taking narcotic pain medications or sedatives.  If you develop intractable nausea and vomiting or a severe headache please notify your doctor immediately.  FOLLOW-UP:  Please make an appointment with your surgeon as instructed. You do not need to follow up with anesthesia unless specifically instructed to do so.  WOUND CARE INSTRUCTIONS (if applicable):  Keep a dry clean dressing on the anesthesia/puncture wound  site if there is drainage.  Once the wound has quit draining you may leave it open to air.  Generally you should leave the bandage intact for twenty four hours unless there is drainage.  If the epidural site drains for more than 36-48 hours please call the anesthesia department.  QUESTIONS?:  Please feel free to call your physician or the hospital operator if you have any questions, and they will be happy to assist you.      Cataract A cataract is a clouding of the lens of the eye. When a lens becomes cloudy, vision is reduced based on the degree and nature of the clouding. Many cataracts reduce vision to some degree. Some cataracts make people more near-sighted as they develop. Other cataracts increase glare. Cataracts that are ignored and become worse can sometimes look white. The white color can be seen through the pupil. CAUSES   Aging. However, cataracts may occur at any age, even in newborns.  Certain drugs.  Trauma to the eye.  Certain diseases such as diabetes.  Specific eye diseases such as chronic inflammation inside the eye or a sudden attack of a rare form of glaucoma.  Inherited or acquired medical problems. SYMPTOMS   Gradual, progressive drop  in vision in the affected eye.  Severe, rapid visual loss. This most often happens when trauma is the cause. DIAGNOSIS  To detect a cataract, an eye doctor examines the lens. Cataracts are best diagnosed with an exam of the eyes with the pupils enlarged (dilated) by drops.  TREATMENT  For an early cataract, vision may improve by using different eyeglasses or stronger lighting. If that does not help your vision, surgery is the only effective treatment. A cataract needs to be surgically removed when vision loss interferes with your everyday activities, such as driving, reading, or watching TV. A cataract may also have to be removed if it prevents examination or treatment of another eye problem. Surgery removes the cloudy lens and usually  replaces it with a substitute lens (intraocular lens, IOL).  At a time when both you and your doctor agree, the cataract will be surgically removed. If you have cataracts in both eyes, only one is usually removed at a time. This allows the operated eye to heal and be out of danger from any possible problems after surgery (such as infection or poor wound healing). In rare cases, a cataract may be doing damage to your eye. In these cases, your caregiver may advise surgical removal right away. The vast majority of people who have cataract surgery have better vision afterward. HOME CARE INSTRUCTIONS  If you are not planning surgery, you may be asked to do the following:  Use different eyeglasses.  Use stronger or brighter lighting.  Ask your eye doctor about reducing your medicine dose or changing medicines if it is thought that a medicine caused your cataract. Changing medicines does not make the cataract go away on its own.  Become familiar with your surroundings. Poor vision can lead to injury. Avoid bumping into things on the affected side. You are at a higher risk for tripping or falling.  Exercise extreme care when driving or operating machinery.  Wear sunglasses if you are sensitive to bright light or experiencing problems with glare. SEEK IMMEDIATE MEDICAL CARE IF:   You have a worsening or sudden vision loss.  You notice redness, swelling, or increasing pain in the eye.  You have a fever. Document Released: 04/27/2005 Document Revised: 07/20/2011 Document Reviewed: 12/19/2010 [facility] Patient Information 2015 [place], [place]. This information is not intended to replace advice given to you by your health care provider. Make sure you discuss any questions you have with your health care provider.

## 2014-05-16 ENCOUNTER — Encounter (HOSPITAL_COMMUNITY)
Admission: RE | Admit: 2014-05-16 | Discharge: 2014-05-16 | Disposition: A | Discharge status: Home / Self Care | Payer: Medicare Other | Source: Ambulatory Visit | Attending: Ophthalmology | Admitting: Ophthalmology

## 2014-05-16 ENCOUNTER — Encounter (HOSPITAL_COMMUNITY): Payer: Self-pay

## 2014-05-16 DIAGNOSIS — Z01812 Encounter for preprocedural laboratory examination: Secondary | ICD-10-CM | POA: Insufficient documentation

## 2014-05-16 LAB — BASIC METABOLIC PANEL
ANION GAP: 5 (ref 5–15)
BUN: 21 mg/dL (ref 6–23)
CALCIUM: 8.7 mg/dL (ref 8.4–10.5)
CO2: 26 mmol/L (ref 19–32)
CREATININE: 1.25 mg/dL (ref 0.50–1.35)
Chloride: 110 mEq/L (ref 96–112)
GFR calc non Af Amer: 53 mL/min — ABNORMAL LOW (ref >90)
GFR, EST AFRICAN AMERICAN: 62 mL/min — AB (ref >90)
Glucose, Bld: 142 mg/dL — ABNORMAL HIGH (ref 70–99)
Potassium: 4.2 mmol/L (ref 3.5–5.1)
Sodium: 141 mmol/L (ref 135–145)

## 2014-05-16 LAB — HEMOGLOBIN AND HEMATOCRIT, BLOOD
HCT: 42.3 % (ref 39.0–52.0)
HEMOGLOBIN: 13.5 g/dL (ref 13.0–17.0)

## 2014-05-16 NOTE — Pre-Procedure Instructions (Signed)
Patient given information to sign up for my chart at home. 

## 2014-05-17 MED ORDER — KETOROLAC TROMETHAMINE 0.5 % OP SOLN: 1.0000 [drp] | OPHTHALMIC | Status: AC

## 2014-05-18 MED ORDER — PHENYLEPHRINE HCL 2.5 % OP SOLN
OPHTHALMIC | Status: AC
  Filled 2014-05-18: qty 15

## 2014-05-18 MED ORDER — LIDOCAINE HCL (PF) 1 % IJ SOLN
INTRAMUSCULAR | Status: AC
  Filled 2014-05-18: qty 2

## 2014-05-18 MED ORDER — NEOMYCIN-POLYMYXIN-DEXAMETH 3.5-10000-0.1 OP SUSP
OPHTHALMIC | Status: AC
  Filled 2014-05-18: qty 5

## 2014-05-18 MED ORDER — CYCLOPENTOLATE-PHENYLEPHRINE OP SOLN OPTIME - NO CHARGE
OPHTHALMIC | Status: AC
  Filled 2014-05-18: qty 2

## 2014-05-18 MED ORDER — TETRACAINE HCL 0.5 % OP SOLN
OPHTHALMIC | Status: AC
  Filled 2014-05-18: qty 2

## 2014-05-18 MED ORDER — LIDOCAINE HCL 3.5 % OP GEL
OPHTHALMIC | Status: AC
  Filled 2014-05-18: qty 1

## 2014-05-21 ENCOUNTER — Encounter (HOSPITAL_COMMUNITY)
Admission: RE | Disposition: A | Discharge status: Home / Self Care | Payer: Self-pay | Source: Ambulatory Visit | Attending: Ophthalmology

## 2014-05-21 ENCOUNTER — Ambulatory Visit (HOSPITAL_COMMUNITY): Payer: Medicare Other | Admitting: Anesthesiology

## 2014-05-21 ENCOUNTER — Encounter (HOSPITAL_COMMUNITY): Payer: Self-pay | Admitting: *Deleted

## 2014-05-21 ENCOUNTER — Ambulatory Visit (HOSPITAL_COMMUNITY)
Admission: RE | Admit: 2014-05-21 | Discharge: 2014-05-21 | Disposition: A | Discharge status: Home / Self Care | Payer: Medicare Other | Source: Ambulatory Visit | Attending: Ophthalmology | Admitting: Ophthalmology

## 2014-05-21 DIAGNOSIS — H2512 Age-related nuclear cataract, left eye: Secondary | ICD-10-CM | POA: Diagnosis not present

## 2014-05-21 DIAGNOSIS — H259 Unspecified age-related cataract: Secondary | ICD-10-CM | POA: Diagnosis not present

## 2014-05-21 DIAGNOSIS — H2181 Floppy iris syndrome: Secondary | ICD-10-CM | POA: Insufficient documentation

## 2014-05-21 HISTORY — PX: CATARACT EXTRACTION W/PHACO: SHX586

## 2014-05-21 LAB — GLUCOSE, CAPILLARY: GLUCOSE-CAPILLARY: 113 mg/dL — AB (ref 70–99)

## 2014-05-21 SURGERY — PHACOEMULSIFICATION, CATARACT, WITH IOL INSERTION
Anesthesia: Monitor Anesthesia Care | Site: Eye | Laterality: Left

## 2014-05-21 MED ORDER — ONDANSETRON HCL 4 MG/2ML IJ SOLN: 4.0000 mg | Freq: Once | INTRAMUSCULAR | Status: DC | PRN

## 2014-05-21 MED ORDER — TETRACAINE HCL 0.5 % OP SOLN
1.0000 [drp] | OPHTHALMIC | Status: AC
  Administered 2014-05-21 (×3): 1 [drp] via OPHTHALMIC

## 2014-05-21 MED ORDER — LIDOCAINE HCL (PF) 1 % IJ SOLN
INTRAMUSCULAR | Status: DC | PRN
  Administered 2014-05-21: .6 mL

## 2014-05-21 MED ORDER — PROVISC 10 MG/ML IO SOLN
INTRAOCULAR | Status: DC | PRN
  Administered 2014-05-21: 0.85 mL via INTRAOCULAR

## 2014-05-21 MED ORDER — FENTANYL CITRATE 0.05 MG/ML IJ SOLN: 25.0000 ug | INTRAMUSCULAR | Status: DC | PRN

## 2014-05-21 MED ORDER — CYCLOPENTOLATE-PHENYLEPHRINE 0.2-1 % OP SOLN
1.0000 [drp] | OPHTHALMIC | Status: AC
  Administered 2014-05-21 (×3): 1 [drp] via OPHTHALMIC

## 2014-05-21 MED ORDER — LACTATED RINGERS IV SOLN
INTRAVENOUS | Status: DC
  Administered 2014-05-21: 09:00:00 via INTRAVENOUS

## 2014-05-21 MED ORDER — MIDAZOLAM HCL 2 MG/2ML IJ SOLN
1.0000 mg | INTRAMUSCULAR | Status: DC | PRN
  Administered 2014-05-21: 2 mg via INTRAVENOUS

## 2014-05-21 MED ORDER — FENTANYL CITRATE 0.05 MG/ML IJ SOLN
25.0000 ug | INTRAMUSCULAR | Status: AC
  Administered 2014-05-21 (×2): 25 ug via INTRAVENOUS

## 2014-05-21 MED ORDER — PHENYLEPHRINE HCL 2.5 % OP SOLN
1.0000 [drp] | OPHTHALMIC | Status: AC
  Administered 2014-05-21 (×3): 1 [drp] via OPHTHALMIC

## 2014-05-21 MED ORDER — EPINEPHRINE HCL 1 MG/ML IJ SOLN
INTRAOCULAR | Status: DC | PRN
  Administered 2014-05-21: 09:00:00

## 2014-05-21 MED ORDER — EPINEPHRINE HCL 1 MG/ML IJ SOLN
INTRAMUSCULAR | Status: AC
  Filled 2014-05-21: qty 1

## 2014-05-21 MED ORDER — NEOMYCIN-POLYMYXIN-DEXAMETH 3.5-10000-0.1 OP SUSP
OPHTHALMIC | Status: DC | PRN
  Administered 2014-05-21: 2 [drp] via OPHTHALMIC

## 2014-05-21 MED ORDER — FENTANYL CITRATE 0.05 MG/ML IJ SOLN
INTRAMUSCULAR | Status: AC
  Filled 2014-05-21: qty 2

## 2014-05-21 MED ORDER — BSS IO SOLN
INTRAOCULAR | Status: DC | PRN
  Administered 2014-05-21: 15 mL via INTRAOCULAR

## 2014-05-21 MED ORDER — LIDOCAINE HCL 3.5 % OP GEL: 1.0000 "application " | OPHTHALMIC | Status: AC

## 2014-05-21 MED ORDER — MIDAZOLAM HCL 2 MG/2ML IJ SOLN
INTRAMUSCULAR | Status: AC
  Filled 2014-05-21: qty 2

## 2014-05-21 MED ORDER — POVIDONE-IODINE 5 % OP SOLN
OPHTHALMIC | Status: DC | PRN
  Administered 2014-05-21: 1 via OPHTHALMIC

## 2014-05-21 SURGICAL SUPPLY — 34 items
CAPSULAR TENSION RING-AMO (OPHTHALMIC RELATED) IMPLANT
CLOTH BEACON ORANGE TIMEOUT ST (SAFETY) ×2 IMPLANT
EYE SHIELD UNIVERSAL CLEAR (GAUZE/BANDAGES/DRESSINGS) ×2 IMPLANT
GLOVE BIO SURGEON STRL SZ 6.5 (GLOVE) IMPLANT
GLOVE BIO SURGEONS STRL SZ 6.5 (GLOVE)
GLOVE BIOGEL PI IND STRL 6.5 (GLOVE) IMPLANT
GLOVE BIOGEL PI IND STRL 7.0 (GLOVE) IMPLANT
GLOVE BIOGEL PI IND STRL 7.5 (GLOVE) IMPLANT
GLOVE BIOGEL PI INDICATOR 6.5 (GLOVE) ×2
GLOVE BIOGEL PI INDICATOR 7.0 (GLOVE)
GLOVE BIOGEL PI INDICATOR 7.5 (GLOVE)
GLOVE ECLIPSE 6.5 STRL STRAW (GLOVE) IMPLANT
GLOVE ECLIPSE 7.0 STRL STRAW (GLOVE) IMPLANT
GLOVE ECLIPSE 7.5 STRL STRAW (GLOVE) IMPLANT
GLOVE EXAM NITRILE LRG STRL (GLOVE) ×2 IMPLANT
GLOVE EXAM NITRILE MD LF STRL (GLOVE) IMPLANT
GLOVE SKINSENSE NS SZ6.5 (GLOVE)
GLOVE SKINSENSE NS SZ7.0 (GLOVE)
GLOVE SKINSENSE STRL SZ6.5 (GLOVE) IMPLANT
GLOVE SKINSENSE STRL SZ7.0 (GLOVE) IMPLANT
KIT VITRECTOMY (OPHTHALMIC RELATED) IMPLANT
PAD ARMBOARD 7.5X6 YLW CONV (MISCELLANEOUS) ×2 IMPLANT
PROC W NO LENS (INTRAOCULAR LENS)
PROC W SPEC LENS (INTRAOCULAR LENS)
PROCESS W NO LENS (INTRAOCULAR LENS) IMPLANT
PROCESS W SPEC LENS (INTRAOCULAR LENS) IMPLANT
RETRACTOR IRIS SIGHTPATH (OPHTHALMIC RELATED) IMPLANT
RING MALYGIN (MISCELLANEOUS) IMPLANT
SIGHTPATH CAT PROC W REG LENS (Ophthalmic Related) ×3 IMPLANT
SYRINGE LUER LOK 1CC (MISCELLANEOUS) ×2 IMPLANT
TAPE SURG TRANSPARENT 2IN (GAUZE/BANDAGES/DRESSINGS) IMPLANT
TAPE TRANSPARENT 2IN (GAUZE/BANDAGES/DRESSINGS) ×2
VISCOELASTIC ADDITIONAL (OPHTHALMIC RELATED) IMPLANT
WATER STERILE IRR 250ML POUR (IV SOLUTION) ×2 IMPLANT

## 2014-05-21 NOTE — Discharge Instructions (Signed)

## 2014-05-21 NOTE — Anesthesia Postprocedure Evaluation (Signed)
  Anesthesia Post-op Note  Patient: Adam Peck  Procedure(s) Performed: Procedure(s) with comments: CATARACT EXTRACTION PHACO AND INTRAOCULAR LENS PLACEMENT (IOC) (Left) - CDE:8.18  Patient Location: Short Stay  Anesthesia Type:MAC  Level of Consciousness: awake, alert  and oriented  Airway and Oxygen Therapy: Patient Spontanous Breathing  Post-op Pain: none  Post-op Assessment: Post-op Vital signs reviewed  Post-op Vital Signs: Reviewed and stable  Last Vitals:  Filed Vitals:   05/21/14 0814  Pulse: 63  Temp: 36.8 C  Resp: 18    Complications: No apparent anesthesia complications

## 2014-05-21 NOTE — H&P (Signed)
I have reviewed the H&P, the patient was re-examined, and I have identified no interval changes in medical condition and plan of care since the history and physical of record  

## 2014-05-21 NOTE — Transfer of Care (Signed)
Immediate Anesthesia Transfer of Care Note  Patient: Adam Peck  Procedure(s) Performed: Procedure(s) with comments: CATARACT EXTRACTION PHACO AND INTRAOCULAR LENS PLACEMENT (IOC) (Left) - CDE:8.18  Patient Location: Short Stay  Anesthesia Type:MAC  Level of Consciousness: awake  Airway & Oxygen Therapy: Patient Spontanous Breathing  Post-op Assessment: Report given to PACU RN  Post vital signs: Reviewed  Complications: No apparent anesthesia complications

## 2014-05-21 NOTE — Anesthesia Preprocedure Evaluation (Signed)
Anesthesia Evaluation  Patient identified by MRN, date of birth, ID band Patient awake    Reviewed: Allergy & Precautions, H&P , NPO status , Patient's Chart, lab work & pertinent test results, reviewed documented beta blocker date and time   Airway Mallampati: II  TM Distance: >3 FB Neck ROM: full    Dental  (+) Edentulous Upper, Partial Lower   Pulmonary former smoker,          Cardiovascular hypertension, Pt. on medications + CAD and + Cardiac Stents + dysrhythmias Atrial Fibrillation Rhythm:regular Rate:Normal     Neuro/Psych  Headaches, PSYCHIATRIC DISORDERS Anxiety  Neuromuscular disease    GI/Hepatic GERD-  Medicated,  Endo/Other  diabetes, Well Controlled, Type 2, Oral Hypoglycemic AgentsHypothyroidism   Renal/GU Renal InsufficiencyRenal disease     Musculoskeletal   Abdominal   Peds  Hematology   Anesthesia Other Findings   Reproductive/Obstetrics                             Anesthesia Physical Anesthesia Plan  ASA: III  Anesthesia Plan: MAC   Post-op Pain Management:    Induction: Intravenous  Airway Management Planned: Nasal Cannula  Additional Equipment:   Intra-op Plan:   Post-operative Plan:   Informed Consent: I have reviewed the patients History and Physical, chart, labs and discussed the procedure including the risks, benefits and alternatives for the proposed anesthesia with the patient or authorized representative who has indicated his/her understanding and acceptance.     Plan Discussed with:   Anesthesia Plan Comments:         Anesthesia Quick Evaluation

## 2014-05-21 NOTE — Op Note (Signed)
Date of Admission: 05/21/2014  Date of Surgery: 05/21/2014   Pre-Op Dx: Cataract Left Eye  Post-Op Dx: Senile Nuclear Cataract Left  Eye,  Dx Code H25.12. Intraoperative Floppy Iris Syndrome, Left Eye, Dx  Code [phone]  Surgeon: Tonny Branch, M.D.  Assistants: None  Anesthesia: Topical with MAC  Indications: Painless, progressive loss of vision with compromise of daily activities.  Surgery: Cataract Extraction with Intraocular lens Implant Left Eye  Discription: The patient had dilating drops and viscous lidocaine placed into the Left eye in the pre-op holding area. After transfer to the operating room, a time out was performed. The patient was then prepped and draped. Beginning with a 67 degree blade a paracentesis port was made at the surgeon's 2 o'clock position. The anterior chamber was then filled with 1% non-preserved lidocaine. This was followed by filling the anterior chamber with Provisc.  A 2.22mm keratome blade was used to make a clear corneal incision at the temporal limbus.  A bent cystatome needle was used to create a continuous tear capsulotomy. Hydrodissection was performed with balanced salt solution on a Fine canula. The lens nucleus was then removed using the phacoemulsification handpiece. Residual cortex was removed with the I&A handpiece. The anterior chamber and capsular bag were refilled with Provisc. A posterior chamber intraocular lens was placed into the capsular bag with it's injector. The implant was positioned with the Kuglan hook. The Provisc was then removed from the anterior chamber and capsular bag with the I&A handpiece. Stromal hydration of the main incision and paracentesis port was performed with BSS on a Fine canula. The wounds were tested for leak which was negative. The patient tolerated the procedure well. There were no operative complications. The patient was then transferred to the recovery room in stable condition.  Complications: None  Specimen:  None  EBL: None  Prosthetic device: Hoya iSert 250, power 22.0 D, SN [id number].

## 2014-05-22 ENCOUNTER — Encounter (HOSPITAL_COMMUNITY): Payer: Self-pay | Admitting: Ophthalmology

## 2014-05-28 DIAGNOSIS — H2511 Age-related nuclear cataract, right eye: Secondary | ICD-10-CM | POA: Diagnosis not present

## 2014-05-31 ENCOUNTER — Encounter (HOSPITAL_COMMUNITY): Payer: Self-pay

## 2014-05-31 ENCOUNTER — Inpatient Hospital Stay (HOSPITAL_COMMUNITY): Admission: RE | Admit: 2014-05-31 | Payer: Medicare Other | Source: Ambulatory Visit

## 2014-06-01 ENCOUNTER — Inpatient Hospital Stay (HOSPITAL_COMMUNITY)
Admission: RE | Admit: 2014-06-01 | Discharge: 2014-06-01 | Disposition: A | Discharge status: Home / Self Care | Payer: Medicare Other | Source: Ambulatory Visit

## 2014-06-06 MED ORDER — TETRACAINE HCL 0.5 % OP SOLN
OPHTHALMIC | Status: AC
  Filled 2014-06-06: qty 2

## 2014-06-06 MED ORDER — LIDOCAINE HCL (PF) 1 % IJ SOLN
INTRAMUSCULAR | Status: AC
  Filled 2014-06-06: qty 2

## 2014-06-06 MED ORDER — CYCLOPENTOLATE-PHENYLEPHRINE OP SOLN OPTIME - NO CHARGE
OPHTHALMIC | Status: AC
  Filled 2014-06-06: qty 2

## 2014-06-06 MED ORDER — PHENYLEPHRINE HCL 2.5 % OP SOLN
OPHTHALMIC | Status: AC
  Filled 2014-06-06: qty 15

## 2014-06-06 MED ORDER — NEOMYCIN-POLYMYXIN-DEXAMETH 3.5-10000-0.1 OP SUSP
OPHTHALMIC | Status: AC
  Filled 2014-06-06: qty 5

## 2014-06-06 MED ORDER — LIDOCAINE HCL 3.5 % OP GEL
OPHTHALMIC | Status: AC
  Filled 2014-06-06: qty 1

## 2014-06-07 ENCOUNTER — Encounter (HOSPITAL_COMMUNITY): Payer: Self-pay | Admitting: *Deleted

## 2014-06-07 ENCOUNTER — Ambulatory Visit (HOSPITAL_COMMUNITY)
Admission: RE | Admit: 2014-06-07 | Discharge: 2014-06-07 | Disposition: A | Discharge status: Home / Self Care | Payer: Medicare Other | Source: Ambulatory Visit | Attending: Ophthalmology | Admitting: Ophthalmology

## 2014-06-07 ENCOUNTER — Encounter (HOSPITAL_COMMUNITY)
Admission: RE | Disposition: A | Discharge status: Home / Self Care | Payer: Self-pay | Source: Ambulatory Visit | Attending: Ophthalmology

## 2014-06-07 ENCOUNTER — Ambulatory Visit (HOSPITAL_COMMUNITY): Payer: Medicare Other | Admitting: Anesthesiology

## 2014-06-07 DIAGNOSIS — H2511 Age-related nuclear cataract, right eye: Secondary | ICD-10-CM | POA: Diagnosis not present

## 2014-06-07 DIAGNOSIS — H259 Unspecified age-related cataract: Secondary | ICD-10-CM | POA: Diagnosis not present

## 2014-06-07 HISTORY — PX: CATARACT EXTRACTION W/PHACO: SHX586

## 2014-06-07 LAB — GLUCOSE, CAPILLARY: Glucose-Capillary: 133 mg/dL — ABNORMAL HIGH (ref 70–99)

## 2014-06-07 SURGERY — PHACOEMULSIFICATION, CATARACT, WITH IOL INSERTION
Anesthesia: Monitor Anesthesia Care | Site: Eye | Laterality: Right

## 2014-06-07 MED ORDER — MIDAZOLAM HCL 2 MG/2ML IJ SOLN
1.0000 mg | INTRAMUSCULAR | Status: DC | PRN
  Administered 2014-06-07: 2 mg via INTRAVENOUS
  Filled 2014-06-07: qty 2

## 2014-06-07 MED ORDER — EPINEPHRINE HCL 1 MG/ML IJ SOLN
INTRAMUSCULAR | Status: DC | PRN
  Administered 2014-06-07: 09:00:00 via OPHTHALMIC

## 2014-06-07 MED ORDER — BSS IO SOLN: INTRAOCULAR | Status: DC | PRN

## 2014-06-07 MED ORDER — PHENYLEPHRINE-KETOROLAC 1-0.3 % IO SOLN
INTRAOCULAR | Status: DC | PRN
  Administered 2014-06-07: 500 mL via OPHTHALMIC

## 2014-06-07 MED ORDER — LIDOCAINE 3.5 % OP GEL OPTIME - NO CHARGE
OPHTHALMIC | Status: DC | PRN
  Administered 2014-06-07: 1 [drp] via OPHTHALMIC

## 2014-06-07 MED ORDER — POVIDONE-IODINE 5 % OP SOLN
OPHTHALMIC | Status: DC | PRN
  Administered 2014-06-07: 1 via OPHTHALMIC

## 2014-06-07 MED ORDER — BSS IO SOLN
INTRAOCULAR | Status: DC | PRN
  Administered 2014-06-07: 15 mL via INTRAOCULAR

## 2014-06-07 MED ORDER — CYCLOPENTOLATE-PHENYLEPHRINE 0.2-1 % OP SOLN
1.0000 [drp] | OPHTHALMIC | Status: AC
  Administered 2014-06-07 (×3): 1 [drp] via OPHTHALMIC

## 2014-06-07 MED ORDER — NEOMYCIN-POLYMYXIN-DEXAMETH 3.5-10000-0.1 OP SUSP
OPHTHALMIC | Status: DC | PRN
Start: 2014-06-07 — End: 2014-06-07
  Administered 2014-06-07: 1 [drp] via OPHTHALMIC

## 2014-06-07 MED ORDER — GLYCOPYRROLATE 0.2 MG/ML IJ SOLN
INTRAMUSCULAR | Status: DC | PRN
  Administered 2014-06-07: 0.2 mg via INTRAVENOUS

## 2014-06-07 MED ORDER — LACTATED RINGERS IV SOLN
INTRAVENOUS | Status: DC
  Administered 2014-06-07: 1000 mL via INTRAVENOUS

## 2014-06-07 MED ORDER — LACTATED RINGERS IV SOLN
INTRAVENOUS | Status: DC | PRN
  Administered 2014-06-07: 09:00:00 via INTRAVENOUS

## 2014-06-07 MED ORDER — FENTANYL CITRATE 0.05 MG/ML IJ SOLN
25.0000 ug | INTRAMUSCULAR | Status: AC
  Administered 2014-06-07 (×2): 25 ug via INTRAVENOUS
  Filled 2014-06-07: qty 2

## 2014-06-07 MED ORDER — PROVISC 10 MG/ML IO SOLN
INTRAOCULAR | Status: DC | PRN
  Administered 2014-06-07: 0.85 mL via INTRAOCULAR

## 2014-06-07 MED ORDER — LIDOCAINE HCL 3.5 % OP GEL
1.0000 "application " | Freq: Once | OPHTHALMIC | Status: AC
  Administered 2014-06-07: 1 via OPHTHALMIC

## 2014-06-07 MED ORDER — PHENYLEPHRINE HCL 2.5 % OP SOLN
1.0000 [drp] | OPHTHALMIC | Status: AC
  Administered 2014-06-07 (×3): 1 [drp] via OPHTHALMIC

## 2014-06-07 MED ORDER — EPINEPHRINE HCL 1 MG/ML IJ SOLN
INTRAMUSCULAR | Status: AC
  Filled 2014-06-07: qty 1

## 2014-06-07 MED ORDER — TETRACAINE HCL 0.5 % OP SOLN
1.0000 [drp] | OPHTHALMIC | Status: AC
  Administered 2014-06-07 (×3): 1 [drp] via OPHTHALMIC

## 2014-06-07 SURGICAL SUPPLY — 11 items
CLOTH BEACON ORANGE TIMEOUT ST (SAFETY) ×1 IMPLANT
EYE SHIELD UNIVERSAL CLEAR (GAUZE/BANDAGES/DRESSINGS) ×1 IMPLANT
GLOVE BIOGEL PI IND STRL 6.5 (GLOVE) IMPLANT
GLOVE BIOGEL PI INDICATOR 6.5 (GLOVE) ×2
GLOVE EXAM NITRILE LRG STRL (GLOVE) ×1 IMPLANT
PAD ARMBOARD 7.5X6 YLW CONV (MISCELLANEOUS) ×1 IMPLANT
SIGHTPATH CAT PROC W REG LENS (Ophthalmic Related) ×2 IMPLANT
SYRINGE LUER LOK 1CC (MISCELLANEOUS) ×1 IMPLANT
TAPE SURG TRANSPORE 1 IN (GAUZE/BANDAGES/DRESSINGS) IMPLANT
TAPE SURGICAL TRANSPORE 1 IN (GAUZE/BANDAGES/DRESSINGS) ×1
WATER STERILE IRR 250ML POUR (IV SOLUTION) ×1 IMPLANT

## 2014-06-07 NOTE — Anesthesia Postprocedure Evaluation (Signed)
  Anesthesia Post-op Note  Patient: Adam Peck  Procedure(s) Performed: Procedure(s) with comments: CATARACT EXTRACTION PHACO AND INTRAOCULAR LENS PLACEMENT RIGHT (Right) - CDE:10.78  Patient Location: Short Stay  Anesthesia Type:MAC  Level of Consciousness: awake, alert , oriented and patient cooperative  Airway and Oxygen Therapy: Patient Spontanous Breathing  Post-op Pain: none  Post-op Assessment: Post-op Vital signs reviewed, Patient's Cardiovascular Status Stable, Respiratory Function Stable, Patent Airway, No signs of Nausea or vomiting and Pain level controlled  Post-op Vital Signs: Reviewed and stable  Last Vitals:  Filed Vitals:   06/07/14 0910  BP: 117/69  Temp:   Resp: 21    Complications: No apparent anesthesia complications

## 2014-06-07 NOTE — Anesthesia Preprocedure Evaluation (Signed)
Anesthesia Evaluation  Patient identified by MRN, date of birth, ID band Patient awake    Reviewed: Allergy & Precautions, H&P , NPO status , Patient's Chart, lab work & pertinent test results, reviewed documented beta blocker date and time   Airway Mallampati: II  TM Distance: >3 FB Neck ROM: full    Dental  (+) Edentulous Upper, Partial Lower   Pulmonary former smoker,          Cardiovascular hypertension, Pt. on medications + CAD and + Cardiac Stents + dysrhythmias Atrial Fibrillation Rhythm:regular Rate:Normal     Neuro/Psych  Headaches, PSYCHIATRIC DISORDERS Anxiety  Neuromuscular disease    GI/Hepatic GERD-  Medicated,  Endo/Other  diabetes, Well Controlled, Type 2, Oral Hypoglycemic AgentsHypothyroidism   Renal/GU Renal InsufficiencyRenal disease     Musculoskeletal   Abdominal   Peds  Hematology   Anesthesia Other Findings   Reproductive/Obstetrics                             Anesthesia Physical Anesthesia Plan  ASA: III  Anesthesia Plan: MAC   Post-op Pain Management:    Induction: Intravenous  Airway Management Planned: Nasal Cannula  Additional Equipment:   Intra-op Plan:   Post-operative Plan:   Informed Consent: I have reviewed the patients History and Physical, chart, labs and discussed the procedure including the risks, benefits and alternatives for the proposed anesthesia with the patient or authorized representative who has indicated his/her understanding and acceptance.     Plan Discussed with:   Anesthesia Plan Comments:         Anesthesia Quick Evaluation

## 2014-06-07 NOTE — Discharge Instructions (Signed)

## 2014-06-07 NOTE — Anesthesia Procedure Notes (Signed)
Procedure Name: MAC Date/Time: 06/07/2014 9:14 AM Performed by: Andree Elk, AMY A Pre-anesthesia Checklist: Patient identified, Timeout performed, Emergency Drugs available, Suction available and Patient being monitored Oxygen Delivery Method: Nasal cannula

## 2014-06-07 NOTE — Transfer of Care (Signed)
Immediate Anesthesia Transfer of Care Note  Patient: Adam Peck  Procedure(s) Performed: Procedure(s) with comments: CATARACT EXTRACTION PHACO AND INTRAOCULAR LENS PLACEMENT RIGHT (Right) - CDE:10.78  Patient Location: Short Stay  Anesthesia Type:MAC  Level of Consciousness: awake, alert , oriented and patient cooperative  Airway & Oxygen Therapy: Patient Spontanous Breathing  Post-op Assessment: Report given to PACU RN and Post -op Vital signs reviewed and stable  Post vital signs: Reviewed and stable  Last Vitals:  Filed Vitals:   06/07/14 0910  BP: 117/69  Temp:   Resp: 21    Complications: No apparent anesthesia complications

## 2014-06-07 NOTE — H&P (Signed)
I have reviewed the H&P, the patient was re-examined, and I have identified no interval changes in medical condition and plan of care since the history and physical of record  

## 2014-06-07 NOTE — Op Note (Signed)
Date of Admission: 06/07/2014  Date of Surgery: 06/07/2014   Pre-Op Dx: Cataract Right Eye  Post-Op Dx: Senile Nuclear Cataract Right  Eye,  Dx Code H25.11. Pseudoexfoliation syndrome, Right Eye.  Surgeon: Tonny Branch, M.D.  Assistants: None  Anesthesia: Topical with MAC  Indications: Painless, progressive loss of vision with compromise of daily activities.  Surgery: Cataract Extraction with Intraocular lens Implant Right Eye  Discription: The patient had dilating drops and viscous lidocaine placed into the Right eye in the pre-op holding area. After transfer to the operating room, a time out was performed. The patient was then prepped and draped. Beginning with a 28 degree blade a paracentesis port was made at the surgeon's 2 o'clock position. The anterior chamber was then filled with 1% non-preserved lidocaine with epinepherin. [place] was also added to the saline solution. This was followed by filling the anterior chamber with Provisc.  A 2.89mm keratome blade was used to make a clear corneal incision at the temporal limbus.  A bent cystatome needle was used to create a continuous tear capsulotomy. Hydrodissection was performed with balanced salt solution on a Fine canula. The lens nucleus was then removed using the phacoemulsification handpiece. Residual cortex was removed with the I&A handpiece. The anterior chamber and capsular bag were refilled with Provisc. A posterior chamber intraocular lens was placed into the capsular bag with it's injector. The implant was positioned with the Kuglan hook. The Provisc was then removed from the anterior chamber and capsular bag with the I&A handpiece. Stromal hydration of the main incision and paracentesis port was performed with BSS on a Fine canula. The wounds were tested for leak which was negative. The patient tolerated the procedure well. There were no operative complications. The patient was then transferred to the recovery room in stable  condition.  Complications: None  Specimen: None  EBL: None  Prosthetic device: Hoya iSert 250, power 19.0 D, SN [id number].

## 2014-06-08 ENCOUNTER — Encounter (HOSPITAL_COMMUNITY): Payer: Self-pay | Admitting: Ophthalmology

## 2014-06-27 DIAGNOSIS — Z961 Presence of intraocular lens: Secondary | ICD-10-CM | POA: Diagnosis not present

## 2014-07-26 ENCOUNTER — Ambulatory Visit (INDEPENDENT_AMBULATORY_CARE_PROVIDER_SITE_OTHER): Payer: Medicare Other | Admitting: Cardiology

## 2014-07-26 ENCOUNTER — Encounter: Payer: Self-pay | Admitting: Cardiology

## 2014-07-26 VITALS — BP 148/84 | HR 69 | Ht 70.0 in | Wt 201.0 lb

## 2014-07-26 DIAGNOSIS — I10 Essential (primary) hypertension: Secondary | ICD-10-CM

## 2014-07-26 DIAGNOSIS — Z789 Other specified health status: Secondary | ICD-10-CM

## 2014-07-26 DIAGNOSIS — I2510 Atherosclerotic heart disease of native coronary artery without angina pectoris: Secondary | ICD-10-CM

## 2014-07-26 DIAGNOSIS — Z889 Allergy status to unspecified drugs, medicaments and biological substances status: Secondary | ICD-10-CM | POA: Diagnosis not present

## 2014-07-26 NOTE — Progress Notes (Signed)
Cardiology Office Note  Date: 07/26/2014   ID: Adam Peck, DOB 23-Jan-1934, MRN [id number]  PCP: Curlene Labrum, MD  Primary Cardiologist: Rozann Lesches, MD   Chief Complaint  Patient presents with  . Coronary Artery Disease  . Hypertension    History of Present Illness: Adam Peck is a 81 y.o. male last seen in September 2015. He presents for a routine follow-up visit. He denies any angina symptoms or nitroglycerin use. Remains functional in his ADLs including light yard work.   We reviewed his cardiac medications which are outlined below. There have been no significant changes. He has a known history of statin intolerance. Follow-up physical with Dr. Pleas Koch is planned for next month.  I reviewed his most recent cardiac testing from 2014 outlined below.   Past Medical History  Diagnosis Date  . Coronary atherosclerosis of native coronary artery     CABG 08/2012: LIMA-LAD; SVG-RI; SVG-OM1; SVG-PD; DES to the OM and RCA 2005  . Mixed hyperlipidemia     Intolerant to statins  . Sinus bradycardia   . ED (erectile dysfunction)   . GERD (gastroesophageal reflux disease)   . Essential hypertension, benign   . Hypothyroidism   . Enlarged prostate   . Type 2 diabetes mellitus   . Headache(784.0)   . Tick bite 06/2012  . Carpal tunnel syndrome   . Arthritis   . History of gout   . Anxiety   . Orthostatic hypotension   . Paroxysmal atrial fibrillation     Postoperative    Past Surgical History  Procedure Laterality Date  . L4-5 laminectomy      x 2   . Cervical fusion    . Hernia repair    . Cardiac catheterization    . Coronary artery bypass graft N/A 08/30/2012    Procedure: CORONARY ARTERY BYPASS GRAFTING (CABG);  Surgeon: Ivin Poot, MD;  Location: [facility];  Service: Open Heart Surgery;  Laterality: N/A;  . Intraoperative transesophageal echocardiogram N/A 08/30/2012    Procedure: INTRAOPERATIVE TRANSESOPHAGEAL ECHOCARDIOGRAM;  Surgeon: Ivin Poot,  MD;  Location: [facility];  Service: Open Heart Surgery;  Laterality: N/A;  . Carpal tunnel release Right   . Cataract extraction w/phaco Left 05/21/2014    Procedure: CATARACT EXTRACTION PHACO AND INTRAOCULAR LENS PLACEMENT (IOC);  Surgeon: Tonny Branch, MD;  Location: [facility];  Service: Ophthalmology;  Laterality: Left;  CDE:8.18  . Cataract extraction w/phaco Right 06/07/2014    Procedure: CATARACT EXTRACTION PHACO AND INTRAOCULAR LENS PLACEMENT RIGHT;  Surgeon: Tonny Branch, MD;  Location: [facility];  Service: Ophthalmology;  Laterality: Right;  CDE:10.78    Current Outpatient Prescriptions  Medication Sig Dispense Refill  . amLODipine (NORVASC) 5 MG tablet Take 5 mg by mouth daily.    Marland Kitchen aspirin 325 MG tablet Take 1 tablet (325 mg total) by mouth daily.    . calcium-vitamin D (OSCAL WITH D) 500-200 MG-UNIT per tablet Take 1 tablet by mouth 2 (two) times daily.    Marland Kitchen esomeprazole (NEXIUM) 40 MG capsule Take 40 mg by mouth daily.      . fluticasone (FLONASE) 50 MCG/ACT nasal spray Place 2 sprays into both nostrils as needed.    Marland Kitchen levothyroxine (SYNTHROID, LEVOTHROID) 75 MCG tablet Take 75 mcg by mouth daily.      Marland Kitchen losartan (COZAAR) 25 MG tablet Take 1 tablet (25 mg total) by mouth daily. 90 tablet 3  . metFORMIN (GLUCOPHAGE) 500 MG tablet Take 500 mg by mouth  2 (two) times daily with a meal.    . metoprolol tartrate (LOPRESSOR) 25 MG tablet Take 1 tablet (25 mg total) by mouth 2 (two) times daily. 180 tablet 3  . NITROSTAT 0.4 MG SL tablet Take 1 tablet by mouth every 5 (five) minutes x 3 doses as needed.    . Tamsulosin HCl (FLOMAX) 0.4 MG CAPS Take 0.4 mg by mouth at bedtime.      No current facility-administered medications for this visit.    Allergies:  Ezetimibe and Statins   Social History: The patient  reports that he quit smoking about 46 years ago. His smoking use included Cigarettes. He has a 140 pack-year smoking history. He quit smokeless tobacco use about 36 years ago. His smokeless  tobacco use included Chew. He reports that he does not drink alcohol or use illicit drugs.    ROS:  Please see the history of present illness. Otherwise, complete review of systems is positive for interval cataract surgery.  All other systems are reviewed and negative.    Physical Exam: VS:  BP 148/84 mmHg  Pulse 69  Ht 5\' 10"  (1.778 m)  Wt 201 lb (91.173 kg)  BMI 28.84 kg/m2  SpO2 96%, BMI Body mass index is 28.84 kg/(m^2).  Wt Readings from Last 3 Encounters:  07/26/14 201 lb (91.173 kg)  05/21/14 219 lb (99.338 kg)  01/24/14 219 lb (99.338 kg)     Comfortable at rest.  HEENT: Conjunctiva and lids normal, oropharynx clear.  Neck: Supple, no elevated JVP, no thyromegaly.  Lungs: Clear to auscultation, nonlabored breathing at rest.  Thorax: Well-healed sternal incision  Cardiac: Regular rate and rhythm, no S3, no pericardial rub.  Abdomen: Soft, nontender, bowel sounds present.  Extremities: No pitting edema, distal pulses 2+.    ECG: ECG is not ordered today.   Recent Labwork: 05/16/2014: BUN 21; Creatinine 1.25; Hemoglobin 13.5; Potassium 4.2; Sodium 141   Other Studies Reviewed Today:  Echocardiogram 10/06/2012: Study Conclusions  - Left ventricle: The cavity size was normal. There was mild concentric hypertrophy. Systolic function was at the lower limits of normal. The estimated ejection fraction was in the range of 50% to 55%. There is hypokinesis of the anteroseptal myocardium. Doppler parameters are consistent with abnormal left ventricular relaxation (grade 1 diastolic dysfunction). Unable to compare directly with the previous study on this reading station. - Ventricular septum: Septal motion showed paradox. - Aortic valve: Mildly calcified annulus. Trileaflet; mildly calcified leaflets. Mild regurgitation. Valve area: 2.43cm^2 (Vmax). - Mitral valve: Mildly thickened leaflets . Trivial regurgitation. - Left atrium: The atrium was  mildly dilated. - Right ventricle: Systolic function was mildly reduced. - Tricuspid valve: Trivial regurgitation. - Pulmonary arteries: Systolic pressure could not be accurately estimated. - Inferior vena cava: Not visualized. Unable to estimate CVP. - Pericardium, extracardiac: A small pericardial effusion was identified posterior to the heart.   Assessment and Plan:  1. Symptomatically stable with multivessel CAD status post CABG in 2014. Continue medical therapy as outlined above, I encouraged regular activity.  2. History of statin intolerance. He is due for a physical with lab work with Dr. Pleas Koch soon.  3. Essential hypertension, continue current regimen and keep follow-up with Dr. Pleas Koch. No changes were made today.  Current medicines are reviewed at length with the patient today.  The patient does not have concerns regarding medicines.   Disposition: FU with me in 6 months.   Signed, Satira Sark, MD, [facility] 07/26/2014 10:52 AM  [facility] [place], [place], [place] [address] Phone: [phone]; Fax: [phone]

## 2014-07-26 NOTE — Patient Instructions (Signed)
Continue all current medications. Your physician wants you to follow up in: 6 months.  You will receive a reminder letter in the mail one-two months in advance.  If you don't receive a letter, please call our office to schedule the follow up appointment   

## 2014-09-05 DIAGNOSIS — E039 Hypothyroidism, unspecified: Secondary | ICD-10-CM | POA: Diagnosis not present

## 2014-09-05 DIAGNOSIS — E1122 Type 2 diabetes mellitus with diabetic chronic kidney disease: Secondary | ICD-10-CM | POA: Diagnosis not present

## 2014-09-05 DIAGNOSIS — E559 Vitamin D deficiency, unspecified: Secondary | ICD-10-CM | POA: Diagnosis not present

## 2014-09-05 DIAGNOSIS — N400 Enlarged prostate without lower urinary tract symptoms: Secondary | ICD-10-CM | POA: Diagnosis not present

## 2014-09-05 DIAGNOSIS — E782 Mixed hyperlipidemia: Secondary | ICD-10-CM | POA: Diagnosis not present

## 2014-09-05 DIAGNOSIS — I10 Essential (primary) hypertension: Secondary | ICD-10-CM | POA: Diagnosis not present

## 2014-09-12 DIAGNOSIS — E782 Mixed hyperlipidemia: Secondary | ICD-10-CM | POA: Diagnosis not present

## 2014-09-12 DIAGNOSIS — Z23 Encounter for immunization: Secondary | ICD-10-CM | POA: Diagnosis not present

## 2014-09-12 DIAGNOSIS — Z0001 Encounter for general adult medical examination with abnormal findings: Secondary | ICD-10-CM | POA: Diagnosis not present

## 2014-12-05 ENCOUNTER — Other Ambulatory Visit: Payer: Self-pay | Admitting: *Deleted

## 2014-12-05 MED ORDER — METOPROLOL TARTRATE 25 MG PO TABS: 25.0000 mg | ORAL_TABLET | Freq: Two times a day (BID) | ORAL | Status: DC

## 2015-02-28 ENCOUNTER — Ambulatory Visit (INDEPENDENT_AMBULATORY_CARE_PROVIDER_SITE_OTHER): Payer: Medicare Other | Admitting: Cardiology

## 2015-02-28 ENCOUNTER — Encounter: Payer: Self-pay | Admitting: Cardiology

## 2015-02-28 VITALS — BP 138/62 | HR 53 | Ht 70.0 in | Wt 219.0 lb

## 2015-02-28 DIAGNOSIS — Z23 Encounter for immunization: Secondary | ICD-10-CM | POA: Diagnosis not present

## 2015-02-28 DIAGNOSIS — I2510 Atherosclerotic heart disease of native coronary artery without angina pectoris: Secondary | ICD-10-CM | POA: Diagnosis not present

## 2015-02-28 DIAGNOSIS — Z789 Other specified health status: Secondary | ICD-10-CM

## 2015-02-28 DIAGNOSIS — Z889 Allergy status to unspecified drugs, medicaments and biological substances status: Secondary | ICD-10-CM | POA: Diagnosis not present

## 2015-02-28 DIAGNOSIS — I10 Essential (primary) hypertension: Secondary | ICD-10-CM | POA: Diagnosis not present

## 2015-02-28 NOTE — Patient Instructions (Signed)
Your physician recommends that you continue on your current medications as directed. Please refer to the Current Medication list given to you today. Your physician recommends that you schedule a follow-up appointment in: 6 months. You will receive a reminder letter in the mail in about 4 months reminding you to call and schedule your appointment. If you don't receive this letter, please contact our office. 

## 2015-02-28 NOTE — Progress Notes (Signed)
Cardiology Office Note  Date: 02/28/2015   ID: Adam Peck, DOB [birth year], MRN [id number]  PCP: Curlene Labrum, MD  Primary Cardiologist: Rozann Lesches, MD   Chief Complaint  Patient presents with  . Coronary Artery Disease    History of Present Illness: Adam Peck is a 81 y.o. male last seen in March. He presents for a routine follow-up visit. He denies having a significant angina symptoms or progressive shortness of breath since last encounter. Not overly active, maintains basic ADLs, but no regular exercise plan.  We reviewed his medications which are outlined below and stable from a cardiac perspective. He tells me that he will be seeing Dr. Pleas Koch and have lab work done for a physical within the next few weeks.  Follow-up ECG today shows sinus rhythm with decreased R wave progression and nonspecific T-wave changes.   Past Medical History  Diagnosis Date  . Coronary atherosclerosis of native coronary artery     CABG 08/2012: LIMA-LAD; SVG-RI; SVG-OM1; SVG-PD; DES to the OM and RCA 2005  . Mixed hyperlipidemia     Intolerant to statins  . Sinus bradycardia   . ED (erectile dysfunction)   . GERD (gastroesophageal reflux disease)   . Essential hypertension, benign   . Hypothyroidism   . Enlarged prostate   . Type 2 diabetes mellitus ([place])   . Headache(784.0)   . Tick bite 06/2012  . Carpal tunnel syndrome   . Arthritis   . History of gout   . Anxiety   . Orthostatic hypotension   . Paroxysmal atrial fibrillation ([facility])     Postoperative    Current Outpatient Prescriptions  Medication Sig Dispense Refill  . amLODipine (NORVASC) 5 MG tablet Take 5 mg by mouth daily.    Marland Kitchen aspirin 81 MG tablet Take 81 mg by mouth daily.    . calcium-vitamin D (OSCAL WITH D) 500-200 MG-UNIT per tablet Take 1 tablet by mouth 2 (two) times daily.    Marland Kitchen esomeprazole (NEXIUM) 40 MG capsule Take 40 mg by mouth daily.      . fluticasone (FLONASE) 50 MCG/ACT nasal spray Place 2  sprays into both nostrils as needed.    Marland Kitchen levothyroxine (SYNTHROID, LEVOTHROID) 75 MCG tablet Take 75 mcg by mouth daily.      Marland Kitchen losartan (COZAAR) 25 MG tablet Take 1 tablet (25 mg total) by mouth daily. 90 tablet 3  . metFORMIN (GLUCOPHAGE) 500 MG tablet Take 500 mg by mouth 2 (two) times daily with a meal.    . metoprolol tartrate (LOPRESSOR) 25 MG tablet Take 1 tablet (25 mg total) by mouth 2 (two) times daily. 180 tablet 0  . NITROSTAT 0.4 MG SL tablet Take 1 tablet by mouth every 5 (five) minutes x 3 doses as needed.    . Tamsulosin HCl (FLOMAX) 0.4 MG CAPS Take 0.4 mg by mouth at bedtime.      No current facility-administered medications for this visit.    Allergies:  Ezetimibe and Statins   Social History: The patient  reports that he quit smoking about 46 years ago. His smoking use included Cigarettes. He has a 140 pack-year smoking history. He quit smokeless tobacco use about 36 years ago. His smokeless tobacco use included Chew. He reports that he does not drink alcohol or use illicit drugs.   ROS:  Please see the history of present illness. Otherwise, complete review of systems is positive for bilateral hip pain.  All other systems are reviewed and negative.  Physical Exam: VS:  BP 138/62 mmHg  Pulse 53  Ht 5\' 10"  (1.778 m)  Wt 219 lb (99.338 kg)  BMI 31.42 kg/m2  SpO2 95%, BMI Body mass index is 31.42 kg/(m^2).  Wt Readings from Last 3 Encounters:  02/28/15 219 lb (99.338 kg)  07/26/14 201 lb (91.173 kg)  05/21/14 219 lb (99.338 kg)     Comfortable at rest.  HEENT: Conjunctiva and lids normal, oropharynx clear.  Neck: Supple, no elevated JVP, no thyromegaly.  Lungs: Clear to auscultation, nonlabored breathing at rest.  Thorax: Well-healed sternal incision  Cardiac: Regular rate and rhythm, no S3, no pericardial rub.  Abdomen: Soft, nontender, bowel sounds present.  Extremities: No pitting edema, distal pulses 2+.    ECG: ECG is ordered today.   Recent  Labwork: 05/16/2014: BUN 21; Creatinine, Ser 1.25; Hemoglobin 13.5; Potassium 4.2; Sodium 141   Other Studies Reviewed Today:  Echocardiogram 10/06/2012: Study Conclusions  - Left ventricle: The cavity size was normal. There was mild concentric hypertrophy. Systolic function was at the lower limits of normal. The estimated ejection fraction was in the range of 50% to 55%. There is hypokinesis of the anteroseptal myocardium. Doppler parameters are consistent with abnormal left ventricular relaxation (grade 1 diastolic dysfunction). Unable to compare directly with the previous study on this reading station. - Ventricular septum: Septal motion showed paradox. - Aortic valve: Mildly calcified annulus. Trileaflet; mildly calcified leaflets. Mild regurgitation. Valve area: 2.43cm^2 (Vmax). - Mitral valve: Mildly thickened leaflets . Trivial regurgitation. - Left atrium: The atrium was mildly dilated. - Right ventricle: Systolic function was mildly reduced. - Tricuspid valve: Trivial regurgitation. - Pulmonary arteries: Systolic pressure could not be accurately estimated. - Inferior vena cava: Not visualized. Unable to estimate CVP. - Pericardium, extracardiac: A small pericardial effusion was identified posterior to the heart.  Assessment and Plan:  1. Multivessel CAD status post CABG in 2014. He is symptomatically stable medical therapy. No changes were made today.  2. Essential hypertension, blood pressure control is adequate today.  3. Hyperlipidemia with statin intolerance. He will be having follow-up lab work with Dr. Pleas Koch soon.  Current medicines were reviewed with the patient today.   Orders Placed This Encounter  Procedures  . EKG 12-Lead    Disposition: FU with me in 6 months.   Signed, Satira Sark, MD, [facility] 02/28/2015 10:42 AM    [facility] [place], [place], [place] [address] Phone: [phone]; Fax: [phone]

## 2015-03-08 DIAGNOSIS — I10 Essential (primary) hypertension: Secondary | ICD-10-CM | POA: Diagnosis not present

## 2015-03-08 DIAGNOSIS — E1165 Type 2 diabetes mellitus with hyperglycemia: Secondary | ICD-10-CM | POA: Diagnosis not present

## 2015-03-08 DIAGNOSIS — E039 Hypothyroidism, unspecified: Secondary | ICD-10-CM | POA: Diagnosis not present

## 2015-03-08 DIAGNOSIS — E559 Vitamin D deficiency, unspecified: Secondary | ICD-10-CM | POA: Diagnosis not present

## 2015-03-08 DIAGNOSIS — E782 Mixed hyperlipidemia: Secondary | ICD-10-CM | POA: Diagnosis not present

## 2015-03-14 DIAGNOSIS — E1165 Type 2 diabetes mellitus with hyperglycemia: Secondary | ICD-10-CM | POA: Diagnosis not present

## 2015-03-14 DIAGNOSIS — Z1389 Encounter for screening for other disorder: Secondary | ICD-10-CM | POA: Diagnosis not present

## 2015-03-14 DIAGNOSIS — E782 Mixed hyperlipidemia: Secondary | ICD-10-CM | POA: Diagnosis not present

## 2015-03-14 DIAGNOSIS — I10 Essential (primary) hypertension: Secondary | ICD-10-CM | POA: Diagnosis not present

## 2015-03-14 DIAGNOSIS — N182 Chronic kidney disease, stage 2 (mild): Secondary | ICD-10-CM | POA: Diagnosis not present

## 2015-03-14 DIAGNOSIS — E1122 Type 2 diabetes mellitus with diabetic chronic kidney disease: Secondary | ICD-10-CM | POA: Diagnosis not present

## 2015-04-08 ENCOUNTER — Other Ambulatory Visit: Payer: Self-pay | Admitting: *Deleted

## 2015-04-08 MED ORDER — METOPROLOL TARTRATE 25 MG PO TABS: 25.0000 mg | ORAL_TABLET | Freq: Two times a day (BID) | ORAL | Status: DC

## 2015-05-18 DIAGNOSIS — H40033 Anatomical narrow angle, bilateral: Secondary | ICD-10-CM | POA: Diagnosis not present

## 2015-05-18 DIAGNOSIS — H2513 Age-related nuclear cataract, bilateral: Secondary | ICD-10-CM | POA: Diagnosis not present

## 2015-06-26 DIAGNOSIS — H40033 Anatomical narrow angle, bilateral: Secondary | ICD-10-CM | POA: Diagnosis not present

## 2015-06-26 DIAGNOSIS — H2513 Age-related nuclear cataract, bilateral: Secondary | ICD-10-CM | POA: Diagnosis not present

## 2015-08-12 DIAGNOSIS — H26493 Other secondary cataract, bilateral: Secondary | ICD-10-CM | POA: Diagnosis not present

## 2015-08-12 DIAGNOSIS — H47291 Other optic atrophy, right eye: Secondary | ICD-10-CM | POA: Diagnosis not present

## 2015-08-12 DIAGNOSIS — D3131 Benign neoplasm of right choroid: Secondary | ICD-10-CM | POA: Diagnosis not present

## 2015-08-30 ENCOUNTER — Ambulatory Visit (INDEPENDENT_AMBULATORY_CARE_PROVIDER_SITE_OTHER): Payer: Medicare Other | Admitting: Cardiology

## 2015-08-30 ENCOUNTER — Encounter: Payer: Self-pay | Admitting: Cardiology

## 2015-08-30 VITALS — BP 144/86 | HR 58 | Ht 70.0 in | Wt 224.0 lb

## 2015-08-30 DIAGNOSIS — I2510 Atherosclerotic heart disease of native coronary artery without angina pectoris: Secondary | ICD-10-CM

## 2015-08-30 DIAGNOSIS — R5383 Other fatigue: Secondary | ICD-10-CM

## 2015-08-30 DIAGNOSIS — E785 Hyperlipidemia, unspecified: Secondary | ICD-10-CM

## 2015-08-30 DIAGNOSIS — Z889 Allergy status to unspecified drugs, medicaments and biological substances status: Secondary | ICD-10-CM

## 2015-08-30 DIAGNOSIS — I10 Essential (primary) hypertension: Secondary | ICD-10-CM

## 2015-08-30 DIAGNOSIS — Z789 Other specified health status: Secondary | ICD-10-CM

## 2015-08-30 MED ORDER — METOPROLOL TARTRATE 25 MG PO TABS: 12.5000 mg | ORAL_TABLET | Freq: Two times a day (BID) | ORAL | Status: DC

## 2015-08-30 NOTE — Progress Notes (Signed)
Cardiology Office Note  Date: 08/30/2015   ID: Adam Peck, DOB [birth year], MRN [id number]  PCP: Curlene Labrum, MD  Primary Cardiologist: Adam Lesches, MD   Chief Complaint  Patient presents with  . Coronary Artery Disease    History of Present Illness: Adam Peck is a 82 y.o. male last seen in October 2016. He presents for a routine follow-up visit. He denies any significant angina symptoms. Still complains of being fatigued. We went over his medications and talked about cutting back his metoprolol to see if this might help. He has had no dizziness, palpitations, or syncope.  Cardiac regimen includes aspirin, Norvasc, Cozaar, Lopressor, and as needed nitroglycerin.  He states that he will be having a follow-up visit with lab work with Dr. Pleas Peck soon.  Past Medical History  Diagnosis Date  . Coronary atherosclerosis of native coronary artery     CABG 08/2012: LIMA-LAD; SVG-RI; SVG-OM1; SVG-PD; DES to the OM and RCA 2005  . Mixed hyperlipidemia     Intolerant to statins  . Sinus bradycardia   . ED (erectile dysfunction)   . GERD (gastroesophageal reflux disease)   . Essential hypertension, benign   . Hypothyroidism   . Enlarged prostate   . Type 2 diabetes mellitus ([place])   . Headache(784.0)   . Tick bite 06/2012  . Carpal tunnel syndrome   . Arthritis   . History of gout   . Anxiety   . Orthostatic hypotension   . Paroxysmal atrial fibrillation (HCC)     Postoperative    Past Surgical History  Procedure Laterality Date  . L4-5 laminectomy      x 2   . Cervical fusion    . Hernia repair    . Cardiac catheterization    . Coronary artery bypass graft N/A 08/30/2012    Procedure: CORONARY ARTERY BYPASS GRAFTING (CABG);  Surgeon: Adam Poot, MD;  Location: [facility];  Service: Open Heart Surgery;  Laterality: N/A;  . Intraoperative transesophageal echocardiogram N/A 08/30/2012    Procedure: INTRAOPERATIVE TRANSESOPHAGEAL ECHOCARDIOGRAM;  Surgeon: Adam Poot, MD;  Location: [facility];  Service: Open Heart Surgery;  Laterality: N/A;  . Carpal tunnel release Right   . Cataract extraction w/phaco Left 05/21/2014    Procedure: CATARACT EXTRACTION PHACO AND INTRAOCULAR LENS PLACEMENT (IOC);  Surgeon: Adam Branch, MD;  [facility]: [facility];  Service: Ophthalmology;  Laterality: Left;  CDE:8.18  . Cataract extraction w/phaco Right 06/07/2014    Procedure: CATARACT EXTRACTION PHACO AND INTRAOCULAR LENS PLACEMENT RIGHT;  Surgeon: Adam Branch, MD;  [facility]: [facility];  Service: Ophthalmology;  Laterality: Right;  CDE:10.78    Current Outpatient Prescriptions  Medication Sig Dispense Refill  . amLODipine (NORVASC) 5 MG tablet Take 5 mg by mouth daily.    Marland Kitchen aspirin 81 MG tablet Take 81 mg by mouth daily.    . calcium-vitamin D (OSCAL WITH D) 500-200 MG-UNIT per tablet Take 1 tablet by mouth 2 (two) times daily.    Marland Kitchen esomeprazole (NEXIUM) 40 MG capsule Take 40 mg by mouth daily.      . fluticasone (FLONASE) 50 MCG/ACT nasal spray Place 2 sprays into both nostrils as needed.    Marland Kitchen levothyroxine (SYNTHROID, LEVOTHROID) 75 MCG tablet Take 75 mcg by mouth daily.      Marland Kitchen losartan (COZAAR) 100 MG tablet Take 1 tablet by mouth daily.    . metFORMIN (GLUCOPHAGE) 500 MG tablet Take 500 mg by mouth 2 (two) times daily with a  meal.    . metoprolol tartrate (LOPRESSOR) 25 MG tablet Take 0.5 tablets (12.5 mg total) by mouth 2 (two) times daily. 90 tablet 3  . NITROSTAT 0.4 MG SL tablet Take 1 tablet by mouth every 5 (five) minutes x 3 doses as needed.    . Tamsulosin HCl (FLOMAX) 0.4 MG CAPS Take 0.4 mg by mouth at bedtime.      No current facility-administered medications for this visit.   Allergies:  Ezetimibe and Statins   Social History: The patient  reports that he quit smoking about 47 years ago. His smoking use included Cigarettes. He has a 140 pack-year smoking history. He quit smokeless tobacco use about 37 years ago. His smokeless tobacco use included Chew. He  reports that he does not drink alcohol or use illicit drugs.   ROS:  Please see the history of present illness. Otherwise, complete review of systems is positive for arthritic symptoms.  All other systems are reviewed and negative.   Physical Exam: VS:  BP 144/86 mmHg  Pulse 58  Ht 5\' 10"  (1.778 m)  Wt 224 lb (101.606 kg)  BMI 32.14 kg/m2  SpO2 96%, BMI Body mass index is 32.14 kg/(m^2).  Wt Readings from Last 3 Encounters:  08/30/15 224 lb (101.606 kg)  02/28/15 219 lb (99.338 kg)  07/26/14 201 lb (91.173 kg)    Comfortable at rest.  HEENT: Conjunctiva and lids normal, oropharynx clear.  Neck: Supple, no elevated JVP, no thyromegaly.  Lungs: Clear to auscultation, nonlabored breathing at rest.  Thorax: Well-healed sternal incision  Cardiac: Regular rate and rhythm, no S3, no pericardial rub.  Abdomen: Soft, nontender, bowel sounds present.  Extremities: No pitting edema, distal pulses 2+.   ECG: I personally reviewed the prior tracing from 02/28/2015 which showed normal sinus rhythm with decreased R wave progression.  Recent Labwork:  October 2016: Cholesterol 203, triglycerides 132, HDL 31, LDL 146  Other Studies Reviewed Today:  Echocardiogram 10/06/2012: Study Conclusions  - Left ventricle: The cavity size was normal. There was mild concentric hypertrophy. Systolic function was at the lower limits of normal. The estimated ejection fraction was in the range of 50% to 55%. There is hypokinesis of the anteroseptal myocardium. Doppler parameters are consistent with abnormal left ventricular relaxation (grade 1 diastolic dysfunction). Unable to compare directly with the previous study on this reading station. - Ventricular septum: Septal motion showed paradox. - Aortic valve: Mildly calcified annulus. Trileaflet; mildly calcified leaflets. Mild regurgitation. Valve area: 2.43cm^2 (Vmax). - Mitral valve: Mildly thickened leaflets .  Trivial regurgitation. - Left atrium: The atrium was mildly dilated. - Right ventricle: Systolic function was mildly reduced. - Tricuspid valve: Trivial regurgitation. - Pulmonary arteries: Systolic pressure could not be accurately estimated. - Inferior vena cava: Not visualized. Unable to estimate CVP. - Pericardium, extracardiac: A small pericardial effusion was identified posterior to the heart.  Assessment and Plan:  1. Multivessel CAD status post CABG in 2014. He does not report any active angina symptoms. We'll plan to continue medical therapy and observation.  2. Fatigue. Plan to cut back Lopressor to 12.5 mg twice daily to see if this is helpful. He is bradycardic at rest.  3. Hyperlipidemia with intolerance to statins.  4. Essential hypertension, continue Norvasc and Cozaar.  Current medicines were reviewed with the patient today.  Disposition: FU with me in 6 months.   Signed, Satira Sark, MD, [facility] 08/30/2015 10:56 AM    [facility]  [address], [place], [place] [address] Phone: [phone]; Fax: [phone]

## 2015-08-30 NOTE — Patient Instructions (Signed)
Your physician has recommended you make the following change in your medication:  Decrease metoprolol tartrate to 12.5 mg twice daily. Please break your 25 mg tablet in half twice daily. Continue all other medications the same. Your physician recommends that you schedule a follow-up appointment in: 6 months. You will receive a reminder letter in the mail in about 4 months reminding you to call and schedule your appointment. If you don't receive this letter, please contact our office.

## 2015-09-09 DIAGNOSIS — I240 Acute coronary thrombosis not resulting in myocardial infarction: Secondary | ICD-10-CM | POA: Diagnosis not present

## 2015-09-09 DIAGNOSIS — I10 Essential (primary) hypertension: Secondary | ICD-10-CM | POA: Diagnosis not present

## 2015-09-09 DIAGNOSIS — E782 Mixed hyperlipidemia: Secondary | ICD-10-CM | POA: Diagnosis not present

## 2015-09-09 DIAGNOSIS — E039 Hypothyroidism, unspecified: Secondary | ICD-10-CM | POA: Diagnosis not present

## 2015-09-09 DIAGNOSIS — E1122 Type 2 diabetes mellitus with diabetic chronic kidney disease: Secondary | ICD-10-CM | POA: Diagnosis not present

## 2015-09-11 DIAGNOSIS — E782 Mixed hyperlipidemia: Secondary | ICD-10-CM | POA: Diagnosis not present

## 2015-09-11 DIAGNOSIS — Z0001 Encounter for general adult medical examination with abnormal findings: Secondary | ICD-10-CM | POA: Diagnosis not present

## 2016-03-11 NOTE — Progress Notes (Signed)
Cardiology Office Note  Date: 03/13/2016   ID: DAKOATA DIMITROV, DOB 1934/03/06, MRN [id number]  PCP: Curlene Labrum, MD  Primary Cardiologist: Rozann Lesches, MD   Chief Complaint  Patient presents with  . Coronary Artery Disease    History of Present Illness: Adam Peck is a 82 y.o. male last seen in April. He presents for a routine follow-up visit. Reports no angina symptoms, but still generally weak and tired. He did not notice much difference when we cut back his Lopressor. Today we talked about stopping the medicine altogether. I did review his ECG which shows sinus bradycardia at 57 with decreased R wave progression.  Additional cardiac medications include aspirin, Norvasc, Cozaar, and as needed nitroglycerin. He will be seeing Dr. Pleas Koch in a few weeks for a routine visit.  Past Medical History:  Diagnosis Date  . Anxiety   . Arthritis   . Carpal tunnel syndrome   . Coronary atherosclerosis of native coronary artery    CABG 08/2012: LIMA-LAD; SVG-RI; SVG-OM1; SVG-PD; DES to the OM and RCA 2005  . ED (erectile dysfunction)   . Enlarged prostate   . Essential hypertension, benign   . GERD (gastroesophageal reflux disease)   . Headache(784.0)   . History of gout   . Hypothyroidism   . Mixed hyperlipidemia    Intolerant to statins  . Orthostatic hypotension   . Paroxysmal atrial fibrillation (HCC)    Postoperative  . Sinus bradycardia   . Tick bite 06/2012  . Type 2 diabetes mellitus ([place])     Past Surgical History:  Procedure Laterality Date  . CARDIAC CATHETERIZATION    . CARPAL TUNNEL RELEASE Right   . CATARACT EXTRACTION W/PHACO Left 05/21/2014   Procedure: CATARACT EXTRACTION PHACO AND INTRAOCULAR LENS PLACEMENT (IOC);  Surgeon: Tonny Branch, MD;  [facility];  Service: Ophthalmology;  Laterality: Left;  CDE:8.18  . CATARACT EXTRACTION W/PHACO Right 06/07/2014   Procedure: CATARACT EXTRACTION PHACO AND INTRAOCULAR LENS PLACEMENT RIGHT;  Surgeon: Tonny Branch, MD;  [facility];  Service: Ophthalmology;  Laterality: Right;  CDE:10.78  . CERVICAL FUSION    . CORONARY ARTERY BYPASS GRAFT N/A 08/30/2012   Procedure: CORONARY ARTERY BYPASS GRAFTING (CABG);  Surgeon: Ivin Poot, MD;  Location: [facility];  Service: Open Heart Surgery;  Laterality: N/A;  . HERNIA REPAIR    . INTRAOPERATIVE TRANSESOPHAGEAL ECHOCARDIOGRAM N/A 08/30/2012   Procedure: INTRAOPERATIVE TRANSESOPHAGEAL ECHOCARDIOGRAM;  Surgeon: Ivin Poot, MD;  Location: [facility];  Service: Open Heart Surgery;  Laterality: N/A;  . L4-5 laminectomy     x 2     Current Outpatient Prescriptions  Medication Sig Dispense Refill  . amLODipine (NORVASC) 5 MG tablet Take 5 mg by mouth daily.    Marland Kitchen aspirin 81 MG tablet Take 81 mg by mouth daily.    . calcium-vitamin D (OSCAL WITH D) 500-200 MG-UNIT per tablet Take 1 tablet by mouth 2 (two) times daily.    Marland Kitchen esomeprazole (NEXIUM) 40 MG capsule Take 40 mg by mouth daily.      . fluticasone (FLONASE) 50 MCG/ACT nasal spray Place 2 sprays into both nostrils as needed.    Marland Kitchen levothyroxine (SYNTHROID, LEVOTHROID) 75 MCG tablet Take 75 mcg by mouth daily.      Marland Kitchen losartan (COZAAR) 100 MG tablet Take 1 tablet by mouth daily.    . metFORMIN (GLUCOPHAGE) 500 MG tablet Take 500 mg by mouth 2 (two) times daily with a meal.    .  metoprolol tartrate (LOPRESSOR) 25 MG tablet Take 0.5 tablets (12.5 mg total) by mouth 2 (two) times daily. 90 tablet 3  . NITROSTAT 0.4 MG SL tablet Take 1 tablet by mouth every 5 (five) minutes x 3 doses as needed.    . Tamsulosin HCl (FLOMAX) 0.4 MG CAPS Take 0.4 mg by mouth at bedtime.      No current facility-administered medications for this visit.    Allergies:  Ezetimibe and Statins   Social History: The patient  reports that he quit smoking about 47 years ago. His smoking use included Cigarettes. He has a 140.00 pack-year smoking history. He quit smokeless tobacco use about 37 years ago. His smokeless tobacco use included  Chew. He reports that he does not drink alcohol or use drugs.   ROS:  Please see the history of present illness. Otherwise, complete review of systems is positive for none.  All other systems are reviewed and negative.   Physical Exam: VS:  BP (!) 144/78   Pulse (!) 57   Ht 5\' 10"  (1.778 m)   Wt 232 lb (105.2 kg)   SpO2 98%   BMI 33.29 kg/m , BMI Body mass index is 33.29 kg/m.  Wt Readings from Last 3 Encounters:  03/13/16 232 lb (105.2 kg)  08/30/15 224 lb (101.6 kg)  02/28/15 219 lb (99.3 kg)    Comfortable at rest.  HEENT: Conjunctiva and lids normal, oropharynx clear.  Neck: Supple, no elevated JVP, no thyromegaly.  Lungs: Clear to auscultation, nonlabored breathing at rest.  Thorax: Well-healed sternal incision  Cardiac: Regular rate and rhythm, no S3, no pericardial rub.  Abdomen: Soft, nontender, bowel sounds present.  Extremities: No pitting edema, distal pulses 2+.  Skin: Warm and dry. Musculoskeletal: No kyphosis. Neuropsychiatric: Alert and oriented 3, affect appropriate.  ECG: I personally reviewed the tracing from 02/28/2015 which showed normal sinus rhythm with decreased R wave progression.  Recent Labwork:  October 2016: Cholesterol 203, triglycerides 132, HDL 31, LDL 146  Other Studies Reviewed Today:  Echocardiogram 10/06/2012: Study Conclusions  - Left ventricle: The cavity size was normal. There was mild concentric hypertrophy. Systolic function was at the lower limits of normal. The estimated ejection fraction was in the range of 50% to 55%. There is hypokinesis of the anteroseptal myocardium. Doppler parameters are consistent with abnormal left ventricular relaxation (grade 1 diastolic dysfunction). Unable to compare directly with the previous study on this reading station. - Ventricular septum: Septal motion showed paradox. - Aortic valve: Mildly calcified annulus. Trileaflet; mildly calcified leaflets. Mild  regurgitation. Valve area: 2.43cm^2 (Vmax). - Mitral valve: Mildly thickened leaflets . Trivial regurgitation. - Left atrium: The atrium was mildly dilated. - Right ventricle: Systolic function was mildly reduced. - Tricuspid valve: Trivial regurgitation. - Pulmonary arteries: Systolic pressure could not be accurately estimated. - Inferior vena cava: Not visualized. Unable to estimate CVP. - Pericardium, extracardiac: A small pericardial effusion was identified posterior to the heart.  Assessment and Plan:  1. CAD status post CABG in 2014, no active angina symptoms at this time on medical therapy. We will plan to continue observation. ECG reviewed.  2. Weakness and fatigue, could be multifactorial. With bradycardia in the 50s we will plan to go ahead and stop Lopressor altogether to see if this makes a difference.  3. Hyperlipidemia with statin intolerance.  4. Essential hypertension, systolic blood pressure in the 140s today. Otherwise continue Norvasc and Cozaar.  Current medicines were reviewed with the patient today.   Orders  Placed This Encounter  Procedures  . EKG 12-Lead    Disposition: Follow-up in 6 months.  Signed, Satira Sark, MD, [facility] 03/13/2016 10:07 AM    [facility], [place], [place] [address] Phone: [phone]; Fax: [phone]

## 2016-03-13 ENCOUNTER — Ambulatory Visit (INDEPENDENT_AMBULATORY_CARE_PROVIDER_SITE_OTHER): Payer: Medicare Other | Admitting: Cardiology

## 2016-03-13 ENCOUNTER — Encounter: Payer: Self-pay | Admitting: Cardiology

## 2016-03-13 VITALS — BP 144/78 | HR 57 | Ht 70.0 in | Wt 232.0 lb

## 2016-03-13 DIAGNOSIS — Z789 Other specified health status: Secondary | ICD-10-CM | POA: Diagnosis not present

## 2016-03-13 DIAGNOSIS — E782 Mixed hyperlipidemia: Secondary | ICD-10-CM

## 2016-03-13 DIAGNOSIS — R5383 Other fatigue: Secondary | ICD-10-CM | POA: Diagnosis not present

## 2016-03-13 DIAGNOSIS — I2510 Atherosclerotic heart disease of native coronary artery without angina pectoris: Secondary | ICD-10-CM | POA: Diagnosis not present

## 2016-03-13 DIAGNOSIS — I10 Essential (primary) hypertension: Secondary | ICD-10-CM

## 2016-03-13 NOTE — Patient Instructions (Signed)
Your physician wants you to follow-up in: 6 MONTHS WITH DR MCDOWELL You will receive a reminder letter in the mail two months in advance. If you don't receive a letter, please call our office to schedule the follow-up appointment.  Your physician has recommended you make the following change in your medication:   STOP LOPRESSOR  Thank you for choosing  [facility]!!     

## 2016-03-16 DIAGNOSIS — E1122 Type 2 diabetes mellitus with diabetic chronic kidney disease: Secondary | ICD-10-CM | POA: Diagnosis not present

## 2016-03-16 DIAGNOSIS — E039 Hypothyroidism, unspecified: Secondary | ICD-10-CM | POA: Diagnosis not present

## 2016-03-16 DIAGNOSIS — E782 Mixed hyperlipidemia: Secondary | ICD-10-CM | POA: Diagnosis not present

## 2016-03-25 DIAGNOSIS — E1122 Type 2 diabetes mellitus with diabetic chronic kidney disease: Secondary | ICD-10-CM | POA: Diagnosis not present

## 2016-03-25 DIAGNOSIS — Z23 Encounter for immunization: Secondary | ICD-10-CM | POA: Diagnosis not present

## 2016-03-25 DIAGNOSIS — I10 Essential (primary) hypertension: Secondary | ICD-10-CM | POA: Diagnosis not present

## 2016-03-25 DIAGNOSIS — E039 Hypothyroidism, unspecified: Secondary | ICD-10-CM | POA: Diagnosis not present

## 2016-03-25 DIAGNOSIS — E782 Mixed hyperlipidemia: Secondary | ICD-10-CM | POA: Diagnosis not present

## 2016-03-25 DIAGNOSIS — E1165 Type 2 diabetes mellitus with hyperglycemia: Secondary | ICD-10-CM | POA: Diagnosis not present

## 2016-06-05 DIAGNOSIS — Z961 Presence of intraocular lens: Secondary | ICD-10-CM | POA: Diagnosis not present

## 2016-06-05 DIAGNOSIS — H52222 Regular astigmatism, left eye: Secondary | ICD-10-CM | POA: Diagnosis not present

## 2016-06-05 DIAGNOSIS — Z7984 Long term (current) use of oral hypoglycemic drugs: Secondary | ICD-10-CM | POA: Diagnosis not present

## 2016-06-05 DIAGNOSIS — H524 Presbyopia: Secondary | ICD-10-CM | POA: Diagnosis not present

## 2016-07-17 DIAGNOSIS — K5901 Slow transit constipation: Secondary | ICD-10-CM | POA: Diagnosis not present

## 2016-07-17 DIAGNOSIS — K582 Mixed irritable bowel syndrome: Secondary | ICD-10-CM | POA: Diagnosis not present

## 2016-09-14 NOTE — Progress Notes (Signed)
Cardiology Office Note  Date: 09/15/2016   ID: Adam Peck, DOB 02/09/1934, MRN [id number]  PCP: Curlene Labrum, MD  Primary Cardiologist: Rozann Lesches, MD   Chief Complaint  Patient presents with  . Coronary Artery Disease    History of Present Illness: Adam Peck is an 83 y.o. male last seen in November 2017. He presents for a routine follow-up visit. Reports chronic fatigue but still tries to pace himself with typical activities. He does not indicate any angina symptoms.  We stopped beta blocker therapy altogether at the last visit due to bradycardia and fatigue. He did not seem to make him feel any different, but his heart rate is up to the 60s now and we will keep him off of beta blocker.  He states that he will be seeing Dr. Pleas Koch later this month for physical and lab work. Current cardiac medications include aspirin, Norvasc, Cozaar, and as needed nitroglycerin.  Past Medical History:  Diagnosis Date  . Anxiety   . Arthritis   . Carpal tunnel syndrome   . Coronary atherosclerosis of native coronary artery    CABG 08/2012: LIMA-LAD; SVG-RI; SVG-OM1; SVG-PD; DES to the OM and RCA 2005  . ED (erectile dysfunction)   . Enlarged prostate   . Essential hypertension, benign   . GERD (gastroesophageal reflux disease)   . Headache(784.0)   . History of gout   . Hypothyroidism   . Mixed hyperlipidemia    Intolerant to statins  . Orthostatic hypotension   . Paroxysmal atrial fibrillation (HCC)    Postoperative  . Sinus bradycardia   . Tick bite 06/2012  . Type 2 diabetes mellitus ([place])     Past Surgical History:  Procedure Laterality Date  . CARDIAC CATHETERIZATION    . CARPAL TUNNEL RELEASE Right   . CATARACT EXTRACTION W/PHACO Left 05/21/2014   Procedure: CATARACT EXTRACTION PHACO AND INTRAOCULAR LENS PLACEMENT (IOC);  Surgeon: Tonny Branch, MD;  [facility];  Service: Ophthalmology;  Laterality: Left;  CDE:8.18  . CATARACT EXTRACTION W/PHACO Right  06/07/2014   Procedure: CATARACT EXTRACTION PHACO AND INTRAOCULAR LENS PLACEMENT RIGHT;  Surgeon: Tonny Branch, MD;  [facility];  Service: Ophthalmology;  Laterality: Right;  CDE:10.78  . CERVICAL FUSION    . CORONARY ARTERY BYPASS GRAFT N/A 08/30/2012   Procedure: CORONARY ARTERY BYPASS GRAFTING (CABG);  Surgeon: Ivin Poot, MD;  Location: [facility];  Service: Open Heart Surgery;  Laterality: N/A;  . HERNIA REPAIR    . INTRAOPERATIVE TRANSESOPHAGEAL ECHOCARDIOGRAM N/A 08/30/2012   Procedure: INTRAOPERATIVE TRANSESOPHAGEAL ECHOCARDIOGRAM;  Surgeon: Ivin Poot, MD;  Location: [facility];  Service: Open Heart Surgery;  Laterality: N/A;  . L4-5 laminectomy     x 2     Current Outpatient Prescriptions  Medication Sig Dispense Refill  . amLODipine (NORVASC) 5 MG tablet Take 5 mg by mouth daily.    Marland Kitchen aspirin 81 MG tablet Take 81 mg by mouth daily.    . calcium-vitamin D (OSCAL WITH D) 500-200 MG-UNIT per tablet Take 1 tablet by mouth 2 (two) times daily.    . fluticasone (FLONASE) 50 MCG/ACT nasal spray Place 2 sprays into both nostrils as needed.    Marland Kitchen levothyroxine (SYNTHROID, LEVOTHROID) 75 MCG tablet Take 75 mcg by mouth daily.      Marland Kitchen losartan (COZAAR) 100 MG tablet Take 1 tablet by mouth daily.    . metFORMIN (GLUCOPHAGE) 500 MG tablet Take 500 mg by mouth 2 (two) times daily  with a meal.    . NITROSTAT 0.4 MG SL tablet Take 1 tablet by mouth every 5 (five) minutes x 3 doses as needed.    . Tamsulosin HCl (FLOMAX) 0.4 MG CAPS Take 0.4 mg by mouth at bedtime.      No current facility-administered medications for this visit.    Allergies:  Ezetimibe and Statins   Social History: The patient  reports that he quit smoking about 48 years ago. His smoking use included Cigarettes. He has a 140.00 pack-year smoking history. He quit smokeless tobacco use about 38 years ago. His smokeless tobacco use included Chew. He reports that he does not drink alcohol or use drugs.   ROS:  Please see the  history of present illness. Otherwise, complete review of systems is positive for lack of energy.  All other systems are reviewed and negative.   Physical Exam: VS:  BP 132/77   Pulse 69   Ht 5\' 10"  (1.778 m)   Wt 222 lb (100.7 kg)   SpO2 95%   BMI 31.85 kg/m , BMI Body mass index is 31.85 kg/m.  Wt Readings from Last 3 Encounters:  09/15/16 222 lb (100.7 kg)  03/13/16 232 lb (105.2 kg)  08/30/15 224 lb (101.6 kg)    Elderly male, appears comfortable at rest.  HEENT: Conjunctiva and lids normal, oropharynx clear.  Neck: Supple, no elevated JVP, no thyromegaly.  Lungs: Clear to auscultation, nonlabored breathing at rest.  Thorax: Well-healed sternal incision  Cardiac: Regular rate and rhythm, no S3, no pericardial rub.  Abdomen: Soft, nontender, bowel sounds present.  Extremities: No pitting edema, distal pulses 2+.  Skin: Warm and dry. Musculoskeletal: No kyphosis. Neuropsychiatric: Alert and oriented 3, affect appropriate.  ECG: I personally reviewed the tracing from 03/13/2016 which showed sinus bradycardia with decreased R wave progression.  Recent Labwork:  October 2016: Cholesterol 203, triglycerides 132, HDL 31, LDL 146  Other Studies Reviewed Today:  Echocardiogram 10/06/2012: Study Conclusions  - Left ventricle: The cavity size was normal. There was mild concentric hypertrophy. Systolic function was at the lower limits of normal. The estimated ejection fraction was in the range of 50% to 55%. There is hypokinesis of the anteroseptal myocardium. Doppler parameters are consistent with abnormal left ventricular relaxation (grade 1 diastolic dysfunction). Unable to compare directly with the previous study on this reading station. - Ventricular septum: Septal motion showed paradox. - Aortic valve: Mildly calcified annulus. Trileaflet; mildly calcified leaflets. Mild regurgitation. Valve area: 2.43cm^2 (Vmax). - Mitral valve: Mildly  thickened leaflets . Trivial regurgitation. - Left atrium: The atrium was mildly dilated. - Right ventricle: Systolic function was mildly reduced. - Tricuspid valve: Trivial regurgitation. - Pulmonary arteries: Systolic pressure could not be accurately estimated. - Inferior vena cava: Not visualized. Unable to estimate CVP. - Pericardium, extracardiac: A small pericardial effusion was identified posterior to the heart.  Assessment and Plan:  1. CAD status post CABG in 2014. He does not report any angina at this time on current regimen. We discussed considering a follow-up Myoview study next year.  2. Chronic fatigue and lack of energy. Did not make much difference stopping his Lopressor except that his heart rate is now in the 60s instead of the 50s. Do not plan to resume beta blocker at this point.  3. Hyperlipidemia with documented statin intolerance.  4. Essential hypertension, systolic blood pressure in the 130s today. He continues on Norvasc and Cozaar.  Current medicines were reviewed with the patient today.  Disposition:  Follow-up in 6 months.  Signed, Satira Sark, MD, [facility] 09/15/2016 1:14 PM    [facility] [place], [place], [place] [address] Phone: [phone]; Fax: [phone]

## 2016-09-15 ENCOUNTER — Ambulatory Visit (INDEPENDENT_AMBULATORY_CARE_PROVIDER_SITE_OTHER): Payer: Medicare Other | Admitting: Cardiology

## 2016-09-15 ENCOUNTER — Encounter: Payer: Self-pay | Admitting: Cardiology

## 2016-09-15 VITALS — BP 132/77 | HR 69 | Ht 70.0 in | Wt 222.0 lb

## 2016-09-15 DIAGNOSIS — I2510 Atherosclerotic heart disease of native coronary artery without angina pectoris: Secondary | ICD-10-CM

## 2016-09-15 DIAGNOSIS — I10 Essential (primary) hypertension: Secondary | ICD-10-CM

## 2016-09-15 DIAGNOSIS — R001 Bradycardia, unspecified: Secondary | ICD-10-CM | POA: Diagnosis not present

## 2016-09-15 DIAGNOSIS — E782 Mixed hyperlipidemia: Secondary | ICD-10-CM | POA: Diagnosis not present

## 2016-09-15 DIAGNOSIS — Z789 Other specified health status: Secondary | ICD-10-CM | POA: Diagnosis not present

## 2016-09-15 NOTE — Patient Instructions (Signed)

## 2016-09-24 DIAGNOSIS — E782 Mixed hyperlipidemia: Secondary | ICD-10-CM | POA: Diagnosis not present

## 2016-09-24 DIAGNOSIS — E559 Vitamin D deficiency, unspecified: Secondary | ICD-10-CM | POA: Diagnosis not present

## 2016-09-24 DIAGNOSIS — E1165 Type 2 diabetes mellitus with hyperglycemia: Secondary | ICD-10-CM | POA: Diagnosis not present

## 2016-09-24 DIAGNOSIS — E039 Hypothyroidism, unspecified: Secondary | ICD-10-CM | POA: Diagnosis not present

## 2016-09-24 DIAGNOSIS — E1122 Type 2 diabetes mellitus with diabetic chronic kidney disease: Secondary | ICD-10-CM | POA: Diagnosis not present

## 2016-09-24 DIAGNOSIS — R972 Elevated prostate specific antigen [PSA]: Secondary | ICD-10-CM | POA: Diagnosis not present

## 2016-09-28 DIAGNOSIS — E559 Vitamin D deficiency, unspecified: Secondary | ICD-10-CM | POA: Diagnosis not present

## 2016-09-28 DIAGNOSIS — Z0001 Encounter for general adult medical examination with abnormal findings: Secondary | ICD-10-CM | POA: Diagnosis not present

## 2016-09-30 ENCOUNTER — Encounter (INDEPENDENT_AMBULATORY_CARE_PROVIDER_SITE_OTHER): Payer: Self-pay | Admitting: Internal Medicine

## 2016-10-13 ENCOUNTER — Telehealth (INDEPENDENT_AMBULATORY_CARE_PROVIDER_SITE_OTHER): Payer: Self-pay | Admitting: *Deleted

## 2016-10-13 ENCOUNTER — Encounter (INDEPENDENT_AMBULATORY_CARE_PROVIDER_SITE_OTHER): Payer: Self-pay | Admitting: Internal Medicine

## 2016-10-13 ENCOUNTER — Other Ambulatory Visit (INDEPENDENT_AMBULATORY_CARE_PROVIDER_SITE_OTHER): Payer: Self-pay | Admitting: Internal Medicine

## 2016-10-13 ENCOUNTER — Ambulatory Visit (INDEPENDENT_AMBULATORY_CARE_PROVIDER_SITE_OTHER): Payer: Medicare Other | Admitting: Internal Medicine

## 2016-10-13 ENCOUNTER — Encounter (INDEPENDENT_AMBULATORY_CARE_PROVIDER_SITE_OTHER): Payer: Self-pay | Admitting: *Deleted

## 2016-10-13 VITALS — BP 130/70 | HR 64 | Temp 97.5°F | Ht 70.5 in | Wt 218.5 lb

## 2016-10-13 DIAGNOSIS — Z1211 Encounter for screening for malignant neoplasm of colon: Secondary | ICD-10-CM

## 2016-10-13 DIAGNOSIS — K5909 Other constipation: Secondary | ICD-10-CM

## 2016-10-13 MED ORDER — PEG 3350-KCL-NA BICARB-NACL 420 G PO SOLR: 4000.0000 mL | Freq: Once | ORAL | 0 refills | Status: AC

## 2016-10-13 NOTE — Progress Notes (Signed)
Subjective:    Patient ID: Adam Peck, male    DOB: Sep 21, 1933, 83 y.o.   MRN: [id number]  HPI Referred by Dr. Pleas Koch for constipation. Has tried Linzess which causes diarrhea. Took for 2 months.  He states his stools were okay since he had his CABG. CABG in 2014. He has a BM about every 4-6 days. He says right now he is not taking anything for his constipation. He alternates between constipation and diarrhea. Has tried Miralax in the past which does not help.  He states he is here for a colonoscopy. He has not seen any blood in his stools.  Appetite is good. No weight loss.  No abdominal pain.  No family hx of colon cancer   Has never undergone a colonoscopy in the past.   09/24/2016 H and H  15.6 and 44.0, MCV 84.    Hx of Paroxysmal atrial fib.  Review of Systems Past Medical History:  Diagnosis Date  . Anxiety   . Arthritis   . Carpal tunnel syndrome   . Coronary atherosclerosis of native coronary artery    CABG 08/2012: LIMA-LAD; SVG-RI; SVG-OM1; SVG-PD; DES to the OM and RCA 2005  . ED (erectile dysfunction)   . Enlarged prostate   . Essential hypertension, benign   . GERD (gastroesophageal reflux disease)   . Headache(784.0)   . History of gout   . Hypothyroidism   . Mixed hyperlipidemia    Intolerant to statins  . Orthostatic hypotension   . Paroxysmal atrial fibrillation (HCC)    Postoperative  . Sinus bradycardia   . Tick bite 06/2012  . Type 2 diabetes mellitus ([place])     Past Surgical History:  Procedure Laterality Date  . CARDIAC CATHETERIZATION    . CARPAL TUNNEL RELEASE Right   . CATARACT EXTRACTION W/PHACO Left 05/21/2014   Procedure: CATARACT EXTRACTION PHACO AND INTRAOCULAR LENS PLACEMENT (IOC);  Surgeon: Tonny Branch, MD;  Location: [facility];  Service: Ophthalmology;  Laterality: Left;  CDE:8.18  . CATARACT EXTRACTION W/PHACO Right 06/07/2014   Procedure: CATARACT EXTRACTION PHACO AND INTRAOCULAR LENS PLACEMENT RIGHT;  Surgeon: Tonny Branch, MD;   Location: [facility];  Service: Ophthalmology;  Laterality: Right;  CDE:10.78  . CERVICAL FUSION    . CORONARY ARTERY BYPASS GRAFT N/A 08/30/2012   Procedure: CORONARY ARTERY BYPASS GRAFTING (CABG);  Surgeon: Ivin Poot, MD;  Location: [place];  Service: Open Heart Surgery;  Laterality: N/A;  . HERNIA REPAIR    . INTRAOPERATIVE TRANSESOPHAGEAL ECHOCARDIOGRAM N/A 08/30/2012   Procedure: INTRAOPERATIVE TRANSESOPHAGEAL ECHOCARDIOGRAM;  Surgeon: Ivin Poot, MD;  Location: [place];  Service: Open Heart Surgery;  Laterality: N/A;  . L4-5 laminectomy     x 2     Allergies  Allergen Reactions  . Ezetimibe Other (See Comments)    Reaction:muscle cramps  . Statins Other (See Comments)    Reaction:muscle cramps  Take Prosta Xan twice a day. (OTC)  Current Outpatient Prescriptions on File Prior to Visit  Medication Sig Dispense Refill  . amLODipine (NORVASC) 5 MG tablet Take 5 mg by mouth daily.    Marland Kitchen aspirin 81 MG tablet Take 81 mg by mouth daily.    . calcium-vitamin D (OSCAL WITH D) 500-200 MG-UNIT per tablet Take 1 tablet by mouth 2 (two) times daily.    . fluticasone (FLONASE) 50 MCG/ACT nasal spray Place 2 sprays into both nostrils as needed.    Marland Kitchen levothyroxine (SYNTHROID, LEVOTHROID) 75 MCG tablet Take 75 mcg by  mouth daily.      Marland Kitchen losartan (COZAAR) 100 MG tablet Take 1 tablet by mouth daily.    . metFORMIN (GLUCOPHAGE) 500 MG tablet Take 500 mg by mouth 2 (two) times daily with a meal.    . NITROSTAT 0.4 MG SL tablet Take 1 tablet by mouth every 5 (five) minutes x 3 doses as needed.    . Tamsulosin HCl (FLOMAX) 0.4 MG CAPS Take 0.4 mg by mouth at bedtime.      No current facility-administered medications on file prior to visit.         Objective:   Physical Exam Blood pressure 130/70, pulse 64, temperature 97.5 F (36.4 C), height 5' 10.5" (1.791 m), weight 218 lb 8 oz (99.1 kg). Alert and oriented. Skin warm and dry. Oral mucosa is moist.   . Sclera anicteric, conjunctivae is pink.  Thyroid not enlarged. No cervical lymphadenopathy. Lungs clear. Heart regular rate and rhythm. Murmur heard.  Abdomen is soft. Bowel sounds are positive. No hepatomegaly. No abdominal masses felt. No tenderness.  No edema to lower extremities.          Assessment & Plan:  Constipation. Am going to give him samples of Amitiza to try.  Screening colonoscopy. The risks of bleeding, perforation and infection were reviewed with patient.

## 2016-10-13 NOTE — Telephone Encounter (Signed)
Patient needs trilyte 

## 2016-10-13 NOTE — Patient Instructions (Signed)
The risks of bleeding, perforation and infection were reviewed with patient.  

## 2016-10-14 ENCOUNTER — Encounter (INDEPENDENT_AMBULATORY_CARE_PROVIDER_SITE_OTHER): Payer: Self-pay

## 2016-12-16 DIAGNOSIS — S46211A Strain of muscle, fascia and tendon of other parts of biceps, right arm, initial encounter: Secondary | ICD-10-CM | POA: Diagnosis not present

## 2016-12-18 DIAGNOSIS — S46911D Strain of unspecified muscle, fascia and tendon at shoulder and upper arm level, right arm, subsequent encounter: Secondary | ICD-10-CM | POA: Diagnosis not present

## 2016-12-25 DIAGNOSIS — M5011 Cervical disc disorder with radiculopathy,  high cervical region: Secondary | ICD-10-CM | POA: Diagnosis not present

## 2016-12-25 DIAGNOSIS — M5412 Radiculopathy, cervical region: Secondary | ICD-10-CM | POA: Diagnosis not present

## 2016-12-25 DIAGNOSIS — S46911D Strain of unspecified muscle, fascia and tendon at shoulder and upper arm level, right arm, subsequent encounter: Secondary | ICD-10-CM | POA: Diagnosis not present

## 2016-12-31 DIAGNOSIS — M5412 Radiculopathy, cervical region: Secondary | ICD-10-CM | POA: Diagnosis not present

## 2016-12-31 DIAGNOSIS — E860 Dehydration: Secondary | ICD-10-CM | POA: Diagnosis not present

## 2016-12-31 DIAGNOSIS — M542 Cervicalgia: Secondary | ICD-10-CM | POA: Diagnosis not present

## 2017-01-05 DIAGNOSIS — M4802 Spinal stenosis, cervical region: Secondary | ICD-10-CM | POA: Diagnosis not present

## 2017-01-05 DIAGNOSIS — M47812 Spondylosis without myelopathy or radiculopathy, cervical region: Secondary | ICD-10-CM | POA: Diagnosis not present

## 2017-01-05 DIAGNOSIS — M542 Cervicalgia: Secondary | ICD-10-CM | POA: Diagnosis not present

## 2017-01-07 ENCOUNTER — Encounter (HOSPITAL_COMMUNITY): Payer: Self-pay | Admitting: *Deleted

## 2017-01-07 ENCOUNTER — Encounter (HOSPITAL_COMMUNITY)
Admission: RE | Disposition: A | Discharge status: Home Health | Payer: Self-pay | Source: Ambulatory Visit | Attending: Internal Medicine

## 2017-01-07 ENCOUNTER — Ambulatory Visit (HOSPITAL_COMMUNITY)
Admission: RE | Admit: 2017-01-07 | Discharge: 2017-01-07 | Disposition: A | Discharge status: Home Health | Payer: Medicare Other | Source: Ambulatory Visit | Attending: Internal Medicine | Admitting: Internal Medicine

## 2017-01-07 ENCOUNTER — Encounter (INDEPENDENT_AMBULATORY_CARE_PROVIDER_SITE_OTHER): Payer: Self-pay | Admitting: *Deleted

## 2017-01-07 DIAGNOSIS — Z951 Presence of aortocoronary bypass graft: Secondary | ICD-10-CM | POA: Insufficient documentation

## 2017-01-07 DIAGNOSIS — K5909 Other constipation: Secondary | ICD-10-CM | POA: Insufficient documentation

## 2017-01-07 DIAGNOSIS — E039 Hypothyroidism, unspecified: Secondary | ICD-10-CM | POA: Insufficient documentation

## 2017-01-07 DIAGNOSIS — I10 Essential (primary) hypertension: Secondary | ICD-10-CM | POA: Insufficient documentation

## 2017-01-07 DIAGNOSIS — Z1211 Encounter for screening for malignant neoplasm of colon: Secondary | ICD-10-CM | POA: Diagnosis not present

## 2017-01-07 DIAGNOSIS — E782 Mixed hyperlipidemia: Secondary | ICD-10-CM | POA: Insufficient documentation

## 2017-01-07 DIAGNOSIS — E119 Type 2 diabetes mellitus without complications: Secondary | ICD-10-CM | POA: Insufficient documentation

## 2017-01-07 DIAGNOSIS — I480 Paroxysmal atrial fibrillation: Secondary | ICD-10-CM | POA: Diagnosis not present

## 2017-01-07 DIAGNOSIS — D120 Benign neoplasm of cecum: Secondary | ICD-10-CM | POA: Diagnosis not present

## 2017-01-07 DIAGNOSIS — Z7982 Long term (current) use of aspirin: Secondary | ICD-10-CM | POA: Insufficient documentation

## 2017-01-07 DIAGNOSIS — Z7983 Long term (current) use of bisphosphonates: Secondary | ICD-10-CM | POA: Diagnosis not present

## 2017-01-07 DIAGNOSIS — I2510 Atherosclerotic heart disease of native coronary artery without angina pectoris: Secondary | ICD-10-CM | POA: Insufficient documentation

## 2017-01-07 DIAGNOSIS — Z87891 Personal history of nicotine dependence: Secondary | ICD-10-CM | POA: Insufficient documentation

## 2017-01-07 DIAGNOSIS — N400 Enlarged prostate without lower urinary tract symptoms: Secondary | ICD-10-CM | POA: Insufficient documentation

## 2017-01-07 DIAGNOSIS — Z7984 Long term (current) use of oral hypoglycemic drugs: Secondary | ICD-10-CM | POA: Insufficient documentation

## 2017-01-07 HISTORY — DX: Pain in right elbow: M25.521

## 2017-01-07 HISTORY — PX: COLONOSCOPY: SHX5424

## 2017-01-07 HISTORY — DX: Cervicalgia: M54.2

## 2017-01-07 HISTORY — PX: POLYPECTOMY: SHX5525

## 2017-01-07 HISTORY — DX: Other chronic pain: G89.29

## 2017-01-07 LAB — GLUCOSE, CAPILLARY: GLUCOSE-CAPILLARY: 133 mg/dL — AB (ref 65–99)

## 2017-01-07 SURGERY — COLONOSCOPY
Anesthesia: Moderate Sedation

## 2017-01-07 MED ORDER — STERILE WATER FOR IRRIGATION IR SOLN
Status: DC | PRN
  Administered 2017-01-07: 12:00:00

## 2017-01-07 MED ORDER — MEPERIDINE HCL 50 MG/ML IJ SOLN
INTRAMUSCULAR | Status: DC | PRN
  Administered 2017-01-07 (×2): 25 mg via INTRAVENOUS

## 2017-01-07 MED ORDER — SODIUM CHLORIDE 0.9 % IV SOLN
INTRAVENOUS | Status: DC
  Administered 2017-01-07: 11:00:00 via INTRAVENOUS

## 2017-01-07 MED ORDER — LUBIPROSTONE 24 MCG PO CAPS: 24.0000 ug | ORAL_CAPSULE | Freq: Two times a day (BID) | ORAL | 5 refills | Status: AC

## 2017-01-07 MED ORDER — MIDAZOLAM HCL 5 MG/5ML IJ SOLN
INTRAMUSCULAR | Status: AC
  Filled 2017-01-07: qty 10

## 2017-01-07 MED ORDER — MIDAZOLAM HCL 5 MG/5ML IJ SOLN
INTRAMUSCULAR | Status: DC | PRN
  Administered 2017-01-07: 1 mg via INTRAVENOUS
  Administered 2017-01-07: 2 mg via INTRAVENOUS
  Administered 2017-01-07: 1 mg via INTRAVENOUS

## 2017-01-07 MED ORDER — MEPERIDINE HCL 50 MG/ML IJ SOLN
INTRAMUSCULAR | Status: AC
  Filled 2017-01-07: qty 1

## 2017-01-07 NOTE — Discharge Instructions (Signed)
Resume usual medications including aspirin as before. Amitiza 24 mcg. One dose every morning for 1 week. If it does not work can take it twice daily. Metamucil 4 g by mouth daily at bedtime(OTC). Dulcolax suppository every third dayas needed. No driving for 24 hours. Stool diary for 4 weeks as to frequency and consistency of bowel movements and on days he have to use suppository. Physician recall the biopsy results. Office visit in 4 weeks.   Colonoscopy, Adult, Care After This sheet gives you information about how to care for yourself after your procedure. Your health care provider may also give you more specific instructions. If you have problems or questions, contact your health care provider. What can I expect after the procedure? After the procedure, it is common to have:  A small amount of blood in your stool for 24 hours after the procedure.  Some gas.  Mild abdominal cramping or bloating.  Follow these instructions at home: General instructions   For the first 24 hours after the procedure: ? Do not drive or use machinery. ? Do not sign important documents. ? Do not drink alcohol. ? Do your regular daily activities at a slower pace than normal. ? Eat soft, easy-to-digest foods. ? Rest often.  Take over-the-counter or prescription medicines only as told by your health care provider.  It is up to you to get the results of your procedure. Ask your health care provider, or the department performing the procedure, when your results will be ready. Relieving cramping and bloating  Try walking around when you have cramps or feel bloated.  Apply heat to your abdomen as told by your health care provider. Use a heat source that your health care provider recommends, such as a moist heat pack or a heating pad. ? Place a towel between your skin and the heat source. ? Leave the heat on for 20-30 minutes. ? Remove the heat if your skin turns bright red. This is especially important  if you are unable to feel pain, heat, or cold. You may have a greater risk of getting burned. Eating and drinking  Drink enough fluid to keep your urine clear or pale yellow.  Resume your normal diet as instructed by your health care provider. Avoid heavy or fried foods that are hard to digest.  Avoid drinking alcohol for as long as instructed by your health care provider. Contact a health care provider if:  You have blood in your stool 2-3 days after the procedure. Get help right away if:  You have more than a small spotting of blood in your stool.  You pass large blood clots in your stool.  Your abdomen is swollen.  You have nausea or vomiting.  You have a fever.  You have increasing abdominal pain that is not relieved with medicine. This information is not intended to replace advice given to you by your health care provider. Make sure you discuss any questions you have with your health care provider.   Colon Polyps Polyps are tissue growths inside the body. Polyps can grow in many places, including the large intestine (colon). A polyp may be a round bump or a mushroom-shaped growth. You could have one polyp or several. Most colon polyps are noncancerous (benign). However, some colon polyps can become cancerous over time. What are the causes? The exact cause of colon polyps is not known. What increases the risk? This condition is more likely to develop in people who:  Have a family history of  colon cancer or colon polyps.  Are older than 3 or older than 45 if they are African American.  Have inflammatory bowel disease, such as ulcerative colitis or Crohn disease.  Are overweight.  Smoke cigarettes.  Do not get enough exercise.  Drink too much alcohol.  Eat a diet that is: ? High in fat and red meat. ? Low in fiber.  Had childhood cancer that was treated with abdominal radiation.  What are the signs or symptoms? Most polyps do not cause symptoms. If you have  symptoms, they may include:  Blood coming from your rectum when having a bowel movement.  Blood in your stool.The stool may look dark red or black.  A change in bowel habits, such as constipation or diarrhea.  How is this diagnosed? This condition is diagnosed with a colonoscopy. This is a procedure that uses a lighted, flexible scope to look at the inside of your colon. How is this treated? Treatment for this condition involves removing any polyps that are found. Those polyps will then be tested for cancer. If cancer is found, your health care provider will talk to you about options for colon cancer treatment. Follow these instructions at home: Diet  Eat plenty of fiber, such as fruits, vegetables, and whole grains.  Eat foods that are high in calcium and vitamin D, such as milk, cheese, yogurt, eggs, liver, fish, and broccoli.  Limit foods high in fat, red meats, and processed meats, such as hot dogs, sausage, bacon, and lunch meats.  Maintain a healthy weight, or lose weight if recommended by your health care provider. General instructions  Do not smoke cigarettes.  Do not drink alcohol excessively.  Keep all follow-up visits as told by your health care provider. This is important. This includes keeping regularly scheduled colonoscopies. Talk to your health care provider about when you need a colonoscopy.  Exercise every day or as told by your health care provider. Contact a health care provider if:  You have new or worsening bleeding during a bowel movement.  You have new or increased blood in your stool.  You have a change in bowel habits.  You unexpectedly lose weight. This information is not intended to replace advice given to you by your health care provider. Make sure you discuss any questions you have with your health care provider.

## 2017-01-07 NOTE — H&P (Signed)
Adam Peck is an 83 y.o. male.   Chief Complaint: Patient is here for colonoscopy. HPI: patient is 83 year old Caucasian male who is here for screening colonoscopy. He denies rectal bleeding abdominal pain. He says he's had problems with constipation since CABG in April 2014. He has tried number of medications but without any benefit. He can go as many as 6-7 days without a bowel movement.he does not take pain medication daily. This is patient's firsexam. Family history is negative for CRC.  Past Medical History:  Diagnosis Date  . Anxiety   . Arthritis   . Carpal tunnel syndrome   . Chronic elbow pain, right   . Chronic neck pain   . Coronary atherosclerosis of native coronary artery    CABG 08/2012: LIMA-LAD; SVG-RI; SVG-OM1; SVG-PD; DES to the OM and RCA 2005  . ED (erectile dysfunction)   . Enlarged prostate   . Essential hypertension, benign   . GERD (gastroesophageal reflux disease)   . Headache(784.0)   . History of gout   . Hypothyroidism   . Mixed hyperlipidemia    Intolerant to statins  . Orthostatic hypotension   . Paroxysmal atrial fibrillation (HCC)    Postoperative  . Sinus bradycardia   . Tick bite 06/2012  . Type 2 diabetes mellitus ([place])     Past Surgical History:  Procedure Laterality Date  . CARDIAC CATHETERIZATION    . CARPAL TUNNEL RELEASE Right   . CATARACT EXTRACTION W/PHACO Left 05/21/2014   Procedure: CATARACT EXTRACTION PHACO AND INTRAOCULAR LENS PLACEMENT (IOC);  Surgeon: Tonny Branch, MD;  Location: [facility];  Service: Ophthalmology;  Laterality: Left;  CDE:8.18  . CATARACT EXTRACTION W/PHACO Right 06/07/2014   Procedure: CATARACT EXTRACTION PHACO AND INTRAOCULAR LENS PLACEMENT RIGHT;  Surgeon: Tonny Branch, MD;  Location: [facility];  Service: Ophthalmology;  Laterality: Right;  CDE:10.78  . CERVICAL FUSION    . CORONARY ARTERY BYPASS GRAFT N/A 08/30/2012   Procedure: CORONARY ARTERY BYPASS GRAFTING (CABG);  Surgeon: Ivin Poot, MD;  Location: [place];   Service: Open Heart Surgery;  Laterality: N/A;  . HERNIA REPAIR    . INTRAOPERATIVE TRANSESOPHAGEAL ECHOCARDIOGRAM N/A 08/30/2012   Procedure: INTRAOPERATIVE TRANSESOPHAGEAL ECHOCARDIOGRAM;  Surgeon: Ivin Poot, MD;  Location: [place];  Service: Open Heart Surgery;  Laterality: N/A;  . L4-5 laminectomy     x 2     Family History  Problem Relation Age of Onset  . Colon cancer Neg Hx    Social History:  reports that he quit smoking about 48 years ago. His smoking use included Cigarettes. He has a 140.00 pack-year smoking history. He quit smokeless tobacco use about 38 years ago. His smokeless tobacco use included Chew. He reports that he does not drink alcohol or use drugs.  Allergies:  Allergies  Allergen Reactions  . Ezetimibe Other (See Comments)    Reaction:muscle cramps  . Statins Other (See Comments)    Reaction:muscle cramps    Medications Prior to Admission  Medication Sig Dispense Refill  . amLODipine (NORVASC) 5 MG tablet Take 5 mg by mouth daily.    Marland Kitchen aspirin EC 81 MG tablet Take 81 mg by mouth daily.    . Aspirin-Salicylamide-Caffeine (BC FAST PAIN RELIEF) 650-195-33.3 MG PACK Take 1 packet by mouth daily as needed (for pain.).    Marland Kitchen HYDROcodone-acetaminophen (NORCO/VICODIN) 5-325 MG tablet Take 1 tablet by mouth every 4 (four) hours as needed for moderate pain.    Marland Kitchen levothyroxine (SYNTHROID, LEVOTHROID) 75 MCG tablet  Take 75 mcg by mouth daily before breakfast.     . losartan (COZAAR) 25 MG tablet Take 25 mg by mouth daily.    . metFORMIN (GLUCOPHAGE) 500 MG tablet Take 500 mg by mouth 2 (two) times daily with a meal.    . metoprolol tartrate (LOPRESSOR) 25 MG tablet Take 25 mg by mouth 2 (two) times daily.    Marland Kitchen NITROSTAT 0.4 MG SL tablet Take 0.4 tablets by mouth every 5 (five) minutes x 3 doses as needed for chest pain.     . predniSONE (STERAPRED UNI-PAK 21 TAB) 10 MG (21) TBPK tablet Take 10-60 mg by mouth See admin instructions. TAKE THESE NUMBER OF TABLETS ON  CONSECUTIVE DAYS UNTIL FINISHED:6-5-4-3-2-1    . Tamsulosin HCl (FLOMAX) 0.4 MG CAPS Take 0.4 mg by mouth at bedtime.       Results for orders placed or performed during the hospital encounter of 01/07/17 (from the past 48 hour(s))  Glucose, capillary     Status: Abnormal   Collection Time: 01/07/17 11:12 AM  Result Value Ref Range   Glucose-Capillary 133 (H) 65 - 99 mg/dL   No results found.  ROS  Blood pressure (!) 161/88, pulse 80, temperature (!) 97.4 F (36.3 C), temperature source Oral, resp. rate 14, height 5' 10.5" (1.791 m), weight 206 lb (93.4 kg), SpO2 98 %. Physical Exam  Constitutional: He appears well-developed and well-nourished.  HENT:  Mouth/Throat: Oropharynx is clear and moist.  Eyes: Conjunctivae are normal. No scleral icterus.  Neck: No thyromegaly present.  Cardiovascular:  Iregular rhythm. Normal S1 and S2. Grade 2/6 systolic ejection murmur best at aortic area.  Respiratory: Effort normal and breath sounds normal.  GI: Soft. He exhibits no distension. There is no tenderness.  Musculoskeletal: He exhibits no edema.  Lymphadenopathy:    He has no cervical adenopathy.  Neurological: He is alert.  Skin: Skin is warm and dry.     Assessment/Plan Average risk screening colonoscopy. Chronic constipation.  Hildred Laser, MD 01/07/2017, 11:42 AM

## 2017-01-07 NOTE — Op Note (Addendum)
[facility] Patient Name: Adam Peck Procedure Date: 01/07/2017 11:02 AM MRN: [id number] Date of Birth: 03/04/1934 Attending MD: Hildred Laser , MD CSN: [id number] Age: 83 Admit Type: Outpatient Procedure:                Colonoscopy Indications:              Screening for colorectal malignant neoplasm Providers:                Hildred Laser, MD, Otis Peak B. Sharon Seller, RN, Rosina Lowenstein, RN Referring MD:             Curlene Labrum, MD Medicines:                Meperidine 50 mg IV, Midazolam 4 mg IV Complications:            No immediate complications. Estimated Blood Loss:     Estimated blood loss was minimal. Procedure:                Pre-Anesthesia Assessment:                           - Prior to the procedure, a History and Physical                            was performed, and patient medications and                            allergies were reviewed. The patient's tolerance of                            previous anesthesia was also reviewed. The risks                            and benefits of the procedure and the sedation                            options and risks were discussed with the patient.                            All questions were answered, and informed consent                            was obtained. Prior Anticoagulants: The patient                            last took aspirin 3 days prior to the procedure.                            ASA Grade Assessment: III - A patient with severe                            systemic disease. After reviewing the risks and  benefits, the patient was deemed in satisfactory                            condition to undergo the procedure.                           After obtaining informed consent, the colonoscope                            was passed under direct vision. Throughout the                            procedure, the patient's blood pressure, pulse, and               oxygen saturations were monitored continuously. The                            [id number]-[id number]) scope was introduced through the                            anus and advanced to the the cecum, identified by                            appendiceal orifice and ileocecal valve. The                            colonoscopy was performed without difficulty. The                            patient tolerated the procedure well. The quality                            of the bowel preparation was good. The ileocecal                            valve, appendiceal orifice, and rectum were                            photographed. Scope In: 11:55:45 AM Scope Out: 12:13:27 PM Scope Withdrawal Time: 0 hours 11 minutes 46 seconds  Total Procedure Duration: 0 hours 17 minutes 42 seconds  Findings:      The perianal and digital rectal examinations were normal.      A diminutive polyp was found in the cecum. The polyp was sessile.       Biopsies were taken with a cold forceps for histology.      The exam was otherwise normal throughout the examined colon.      The retroflexed view of the distal rectum and anal verge was normal and       showed no anal or rectal abnormalities. Impression:               - One diminutive polyp in the cecum. Biopsied. Moderate Sedation:      Moderate (conscious) sedation was administered by the endoscopy nurse       and supervised by the endoscopist. The following parameters were  monitored: oxygen saturation, heart rate, blood pressure, CO2       capnography and response to care. Total physician intraservice time was       25 minutes. Recommendation:           - Patient has a contact number available for                            emergencies. The signs and symptoms of potential                            delayed complications were discussed with the                            patient. Return to normal activities tomorrow.                            Written  discharge instructions were provided to the                            patient.                           - High fiber diet today.                           - Continue present medications.                           - Await pathology results.                           - Amitiza 24 ?g by mouth once or twice daily.                           - Metamucil 4 g by mouth daily at bedtime.                           - Dulcolax suppository every third day as needed.                           - No repeat colonoscopy due to age.                           - Office visit in 4 weeks. Procedure Code(s):        --- Professional ---                           [phone], Colonoscopy, flexible; with biopsy, single                            or multiple                           99152, Moderate sedation services provided by the  same physician or other qualified health care                            professional performing the diagnostic or                            therapeutic service that the sedation supports,                            requiring the presence of an independent trained                            observer to assist in the monitoring of the                            patient's level of consciousness and physiological                            status; initial 15 minutes of intraservice time,                            patient age 3 years or older                           [phone], Moderate sedation services; each additional                            15 minutes intraservice time Diagnosis Code(s):        --- Professional ---                           Z12.11, Encounter for screening for malignant                            neoplasm of colon                           D12.0, Benign neoplasm of cecum CPT copyright 2016 American Medical [employer]. All rights reserved. The codes documented in this report are preliminary and upon coder review may  be revised to meet current  compliance requirements. Hildred Laser, MD Hildred Laser, MD 01/07/2017 12:25:14 PM This report has been signed electronically. Number of Addenda: 0

## 2017-01-12 ENCOUNTER — Encounter (HOSPITAL_COMMUNITY): Payer: Self-pay | Admitting: Internal Medicine

## 2017-01-12 DIAGNOSIS — I10 Essential (primary) hypertension: Secondary | ICD-10-CM | POA: Diagnosis not present

## 2017-01-12 DIAGNOSIS — R531 Weakness: Secondary | ICD-10-CM | POA: Diagnosis not present

## 2017-01-12 DIAGNOSIS — E1122 Type 2 diabetes mellitus with diabetic chronic kidney disease: Secondary | ICD-10-CM | POA: Diagnosis not present

## 2017-01-12 DIAGNOSIS — M5011 Cervical disc disorder with radiculopathy,  high cervical region: Secondary | ICD-10-CM | POA: Diagnosis not present

## 2017-01-12 DIAGNOSIS — I2510 Atherosclerotic heart disease of native coronary artery without angina pectoris: Secondary | ICD-10-CM | POA: Diagnosis not present

## 2017-01-12 DIAGNOSIS — E860 Dehydration: Secondary | ICD-10-CM | POA: Diagnosis not present

## 2017-01-12 DIAGNOSIS — M5412 Radiculopathy, cervical region: Secondary | ICD-10-CM | POA: Diagnosis not present

## 2017-01-13 DIAGNOSIS — Z87891 Personal history of nicotine dependence: Secondary | ICD-10-CM | POA: Diagnosis not present

## 2017-01-13 DIAGNOSIS — R531 Weakness: Secondary | ICD-10-CM | POA: Diagnosis not present

## 2017-01-13 DIAGNOSIS — E039 Hypothyroidism, unspecified: Secondary | ICD-10-CM | POA: Diagnosis not present

## 2017-01-13 DIAGNOSIS — E860 Dehydration: Secondary | ICD-10-CM | POA: Diagnosis not present

## 2017-01-13 DIAGNOSIS — Z981 Arthrodesis status: Secondary | ICD-10-CM | POA: Diagnosis not present

## 2017-01-13 DIAGNOSIS — E119 Type 2 diabetes mellitus without complications: Secondary | ICD-10-CM | POA: Diagnosis not present

## 2017-01-13 DIAGNOSIS — G545 Neuralgic amyotrophy: Secondary | ICD-10-CM | POA: Diagnosis not present

## 2017-01-13 DIAGNOSIS — Z7984 Long term (current) use of oral hypoglycemic drugs: Secondary | ICD-10-CM | POA: Diagnosis not present

## 2017-01-13 DIAGNOSIS — Z79899 Other long term (current) drug therapy: Secondary | ICD-10-CM | POA: Diagnosis not present

## 2017-01-13 DIAGNOSIS — R338 Other retention of urine: Secondary | ICD-10-CM | POA: Diagnosis not present

## 2017-01-13 DIAGNOSIS — I10 Essential (primary) hypertension: Secondary | ICD-10-CM | POA: Diagnosis not present

## 2017-01-13 DIAGNOSIS — I2510 Atherosclerotic heart disease of native coronary artery without angina pectoris: Secondary | ICD-10-CM | POA: Diagnosis not present

## 2017-01-13 DIAGNOSIS — Z7982 Long term (current) use of aspirin: Secondary | ICD-10-CM | POA: Diagnosis not present

## 2017-01-13 DIAGNOSIS — M79609 Pain in unspecified limb: Secondary | ICD-10-CM | POA: Diagnosis not present

## 2017-01-13 DIAGNOSIS — Z951 Presence of aortocoronary bypass graft: Secondary | ICD-10-CM | POA: Diagnosis not present

## 2017-01-13 DIAGNOSIS — M5412 Radiculopathy, cervical region: Secondary | ICD-10-CM | POA: Diagnosis not present

## 2017-01-13 DIAGNOSIS — M542 Cervicalgia: Secondary | ICD-10-CM | POA: Diagnosis not present

## 2017-01-13 DIAGNOSIS — N401 Enlarged prostate with lower urinary tract symptoms: Secondary | ICD-10-CM | POA: Diagnosis not present

## 2017-01-16 DIAGNOSIS — Z7982 Long term (current) use of aspirin: Secondary | ICD-10-CM | POA: Diagnosis not present

## 2017-01-16 DIAGNOSIS — I2510 Atherosclerotic heart disease of native coronary artery without angina pectoris: Secondary | ICD-10-CM | POA: Diagnosis not present

## 2017-01-16 DIAGNOSIS — G540 Brachial plexus disorders: Secondary | ICD-10-CM | POA: Diagnosis not present

## 2017-01-16 DIAGNOSIS — Z7984 Long term (current) use of oral hypoglycemic drugs: Secondary | ICD-10-CM | POA: Diagnosis not present

## 2017-01-16 DIAGNOSIS — M542 Cervicalgia: Secondary | ICD-10-CM | POA: Diagnosis not present

## 2017-01-16 DIAGNOSIS — E1140 Type 2 diabetes mellitus with diabetic neuropathy, unspecified: Secondary | ICD-10-CM | POA: Diagnosis not present

## 2017-01-16 DIAGNOSIS — R339 Retention of urine, unspecified: Secondary | ICD-10-CM | POA: Diagnosis not present

## 2017-01-16 DIAGNOSIS — Z466 Encounter for fitting and adjustment of urinary device: Secondary | ICD-10-CM | POA: Diagnosis not present

## 2017-01-16 DIAGNOSIS — E039 Hypothyroidism, unspecified: Secondary | ICD-10-CM | POA: Diagnosis not present

## 2017-01-16 DIAGNOSIS — I10 Essential (primary) hypertension: Secondary | ICD-10-CM | POA: Diagnosis not present

## 2017-01-16 DIAGNOSIS — Z79891 Long term (current) use of opiate analgesic: Secondary | ICD-10-CM | POA: Diagnosis not present

## 2017-01-18 ENCOUNTER — Telehealth: Payer: Self-pay | Admitting: Cardiology

## 2017-01-18 ENCOUNTER — Encounter: Payer: Self-pay | Admitting: *Deleted

## 2017-01-18 DIAGNOSIS — I2510 Atherosclerotic heart disease of native coronary artery without angina pectoris: Secondary | ICD-10-CM | POA: Diagnosis not present

## 2017-01-18 DIAGNOSIS — R339 Retention of urine, unspecified: Secondary | ICD-10-CM | POA: Diagnosis not present

## 2017-01-18 DIAGNOSIS — I10 Essential (primary) hypertension: Secondary | ICD-10-CM | POA: Diagnosis not present

## 2017-01-18 DIAGNOSIS — G540 Brachial plexus disorders: Secondary | ICD-10-CM | POA: Diagnosis not present

## 2017-01-18 DIAGNOSIS — R2681 Unsteadiness on feet: Secondary | ICD-10-CM | POA: Diagnosis not present

## 2017-01-18 DIAGNOSIS — N320 Bladder-neck obstruction: Secondary | ICD-10-CM | POA: Diagnosis not present

## 2017-01-18 DIAGNOSIS — Z7982 Long term (current) use of aspirin: Secondary | ICD-10-CM | POA: Diagnosis not present

## 2017-01-18 DIAGNOSIS — M542 Cervicalgia: Secondary | ICD-10-CM | POA: Diagnosis not present

## 2017-01-18 DIAGNOSIS — M5412 Radiculopathy, cervical region: Secondary | ICD-10-CM | POA: Diagnosis not present

## 2017-01-18 DIAGNOSIS — Z79891 Long term (current) use of opiate analgesic: Secondary | ICD-10-CM | POA: Diagnosis not present

## 2017-01-18 DIAGNOSIS — R269 Unspecified abnormalities of gait and mobility: Secondary | ICD-10-CM | POA: Diagnosis not present

## 2017-01-18 DIAGNOSIS — Z7984 Long term (current) use of oral hypoglycemic drugs: Secondary | ICD-10-CM | POA: Diagnosis not present

## 2017-01-18 DIAGNOSIS — E1140 Type 2 diabetes mellitus with diabetic neuropathy, unspecified: Secondary | ICD-10-CM | POA: Diagnosis not present

## 2017-01-18 DIAGNOSIS — Z466 Encounter for fitting and adjustment of urinary device: Secondary | ICD-10-CM | POA: Diagnosis not present

## 2017-01-18 DIAGNOSIS — E039 Hypothyroidism, unspecified: Secondary | ICD-10-CM | POA: Diagnosis not present

## 2017-01-18 DIAGNOSIS — M6281 Muscle weakness (generalized): Secondary | ICD-10-CM | POA: Diagnosis not present

## 2017-01-18 NOTE — Telephone Encounter (Signed)
Note faxed to [facility] requesting notes.

## 2017-01-18 NOTE — Telephone Encounter (Signed)
Kevan Rosebush called office stating that Mr. Krager was seen at [facility] over the weekend. States that he was Discharged on 01-15-17. Was told to follow up with Cardiology.  Will need to obtain records for appointment.

## 2017-01-19 DIAGNOSIS — I10 Essential (primary) hypertension: Secondary | ICD-10-CM | POA: Diagnosis not present

## 2017-01-19 DIAGNOSIS — I2510 Atherosclerotic heart disease of native coronary artery without angina pectoris: Secondary | ICD-10-CM | POA: Diagnosis not present

## 2017-01-19 DIAGNOSIS — E039 Hypothyroidism, unspecified: Secondary | ICD-10-CM | POA: Diagnosis not present

## 2017-01-19 DIAGNOSIS — M542 Cervicalgia: Secondary | ICD-10-CM | POA: Diagnosis not present

## 2017-01-19 DIAGNOSIS — R339 Retention of urine, unspecified: Secondary | ICD-10-CM | POA: Diagnosis not present

## 2017-01-19 DIAGNOSIS — Z7984 Long term (current) use of oral hypoglycemic drugs: Secondary | ICD-10-CM | POA: Diagnosis not present

## 2017-01-19 DIAGNOSIS — Z79891 Long term (current) use of opiate analgesic: Secondary | ICD-10-CM | POA: Diagnosis not present

## 2017-01-19 DIAGNOSIS — Z7982 Long term (current) use of aspirin: Secondary | ICD-10-CM | POA: Diagnosis not present

## 2017-01-19 DIAGNOSIS — E1140 Type 2 diabetes mellitus with diabetic neuropathy, unspecified: Secondary | ICD-10-CM | POA: Diagnosis not present

## 2017-01-19 DIAGNOSIS — Z466 Encounter for fitting and adjustment of urinary device: Secondary | ICD-10-CM | POA: Diagnosis not present

## 2017-01-19 DIAGNOSIS — G540 Brachial plexus disorders: Secondary | ICD-10-CM | POA: Diagnosis not present

## 2017-01-19 NOTE — Telephone Encounter (Signed)
Attempted to contact patient again - lmtcb.

## 2017-01-19 NOTE — Telephone Encounter (Addendum)
Patient daughter Kevan Rosebush) contacted to give information below.  During the conversation of explaining the MD's review, the call was disconnected.

## 2017-01-19 NOTE — Telephone Encounter (Signed)
Notes received from [facility] last evening & discussed with Dr. Domenic Polite.  Nothing suggestive of any outstanding cardiac issues.  Patient should keep his original appointment for 03/25/2017.  Dr. Domenic Polite did agree with early follow up with his pmd.

## 2017-01-19 NOTE — Telephone Encounter (Signed)
Attempted to contact patient directly - left message to return call.

## 2017-01-20 DIAGNOSIS — E039 Hypothyroidism, unspecified: Secondary | ICD-10-CM | POA: Diagnosis not present

## 2017-01-20 DIAGNOSIS — R339 Retention of urine, unspecified: Secondary | ICD-10-CM | POA: Diagnosis not present

## 2017-01-20 DIAGNOSIS — I10 Essential (primary) hypertension: Secondary | ICD-10-CM | POA: Diagnosis not present

## 2017-01-20 DIAGNOSIS — G540 Brachial plexus disorders: Secondary | ICD-10-CM | POA: Diagnosis not present

## 2017-01-20 DIAGNOSIS — I2510 Atherosclerotic heart disease of native coronary artery without angina pectoris: Secondary | ICD-10-CM | POA: Diagnosis not present

## 2017-01-20 DIAGNOSIS — Z7984 Long term (current) use of oral hypoglycemic drugs: Secondary | ICD-10-CM | POA: Diagnosis not present

## 2017-01-20 DIAGNOSIS — M542 Cervicalgia: Secondary | ICD-10-CM | POA: Diagnosis not present

## 2017-01-20 DIAGNOSIS — Z466 Encounter for fitting and adjustment of urinary device: Secondary | ICD-10-CM | POA: Diagnosis not present

## 2017-01-20 DIAGNOSIS — E1140 Type 2 diabetes mellitus with diabetic neuropathy, unspecified: Secondary | ICD-10-CM | POA: Diagnosis not present

## 2017-01-20 DIAGNOSIS — Z7982 Long term (current) use of aspirin: Secondary | ICD-10-CM | POA: Diagnosis not present

## 2017-01-20 DIAGNOSIS — Z79891 Long term (current) use of opiate analgesic: Secondary | ICD-10-CM | POA: Diagnosis not present

## 2017-01-21 DIAGNOSIS — I2510 Atherosclerotic heart disease of native coronary artery without angina pectoris: Secondary | ICD-10-CM | POA: Diagnosis not present

## 2017-01-21 DIAGNOSIS — Z7982 Long term (current) use of aspirin: Secondary | ICD-10-CM | POA: Diagnosis not present

## 2017-01-21 DIAGNOSIS — Z79891 Long term (current) use of opiate analgesic: Secondary | ICD-10-CM | POA: Diagnosis not present

## 2017-01-21 DIAGNOSIS — I10 Essential (primary) hypertension: Secondary | ICD-10-CM | POA: Diagnosis not present

## 2017-01-21 DIAGNOSIS — G540 Brachial plexus disorders: Secondary | ICD-10-CM | POA: Diagnosis not present

## 2017-01-21 DIAGNOSIS — M542 Cervicalgia: Secondary | ICD-10-CM | POA: Diagnosis not present

## 2017-01-21 DIAGNOSIS — E1140 Type 2 diabetes mellitus with diabetic neuropathy, unspecified: Secondary | ICD-10-CM | POA: Diagnosis not present

## 2017-01-21 DIAGNOSIS — Z466 Encounter for fitting and adjustment of urinary device: Secondary | ICD-10-CM | POA: Diagnosis not present

## 2017-01-21 DIAGNOSIS — R339 Retention of urine, unspecified: Secondary | ICD-10-CM | POA: Diagnosis not present

## 2017-01-21 DIAGNOSIS — Z7984 Long term (current) use of oral hypoglycemic drugs: Secondary | ICD-10-CM | POA: Diagnosis not present

## 2017-01-21 DIAGNOSIS — E039 Hypothyroidism, unspecified: Secondary | ICD-10-CM | POA: Diagnosis not present

## 2017-01-22 DIAGNOSIS — R338 Other retention of urine: Secondary | ICD-10-CM | POA: Diagnosis not present

## 2017-01-25 DIAGNOSIS — G540 Brachial plexus disorders: Secondary | ICD-10-CM | POA: Diagnosis not present

## 2017-01-25 DIAGNOSIS — Z7984 Long term (current) use of oral hypoglycemic drugs: Secondary | ICD-10-CM | POA: Diagnosis not present

## 2017-01-25 DIAGNOSIS — M542 Cervicalgia: Secondary | ICD-10-CM | POA: Diagnosis not present

## 2017-01-25 DIAGNOSIS — I2510 Atherosclerotic heart disease of native coronary artery without angina pectoris: Secondary | ICD-10-CM | POA: Diagnosis not present

## 2017-01-25 DIAGNOSIS — I10 Essential (primary) hypertension: Secondary | ICD-10-CM | POA: Diagnosis not present

## 2017-01-25 DIAGNOSIS — R339 Retention of urine, unspecified: Secondary | ICD-10-CM | POA: Diagnosis not present

## 2017-01-25 DIAGNOSIS — E039 Hypothyroidism, unspecified: Secondary | ICD-10-CM | POA: Diagnosis not present

## 2017-01-25 DIAGNOSIS — Z466 Encounter for fitting and adjustment of urinary device: Secondary | ICD-10-CM | POA: Diagnosis not present

## 2017-01-25 DIAGNOSIS — E1122 Type 2 diabetes mellitus with diabetic chronic kidney disease: Secondary | ICD-10-CM | POA: Diagnosis not present

## 2017-01-25 DIAGNOSIS — Z79891 Long term (current) use of opiate analgesic: Secondary | ICD-10-CM | POA: Diagnosis not present

## 2017-01-25 DIAGNOSIS — G545 Neuralgic amyotrophy: Secondary | ICD-10-CM | POA: Diagnosis not present

## 2017-01-25 DIAGNOSIS — E1140 Type 2 diabetes mellitus with diabetic neuropathy, unspecified: Secondary | ICD-10-CM | POA: Diagnosis not present

## 2017-01-25 DIAGNOSIS — Z7982 Long term (current) use of aspirin: Secondary | ICD-10-CM | POA: Diagnosis not present

## 2017-01-25 DIAGNOSIS — E782 Mixed hyperlipidemia: Secondary | ICD-10-CM | POA: Diagnosis not present

## 2017-01-25 NOTE — Telephone Encounter (Signed)
No return call to present.  

## 2017-01-26 DIAGNOSIS — G540 Brachial plexus disorders: Secondary | ICD-10-CM | POA: Diagnosis not present

## 2017-01-26 DIAGNOSIS — R339 Retention of urine, unspecified: Secondary | ICD-10-CM | POA: Diagnosis not present

## 2017-01-26 DIAGNOSIS — E039 Hypothyroidism, unspecified: Secondary | ICD-10-CM | POA: Diagnosis not present

## 2017-01-26 DIAGNOSIS — I10 Essential (primary) hypertension: Secondary | ICD-10-CM | POA: Diagnosis not present

## 2017-01-26 DIAGNOSIS — Z466 Encounter for fitting and adjustment of urinary device: Secondary | ICD-10-CM | POA: Diagnosis not present

## 2017-01-26 DIAGNOSIS — I2510 Atherosclerotic heart disease of native coronary artery without angina pectoris: Secondary | ICD-10-CM | POA: Diagnosis not present

## 2017-01-26 DIAGNOSIS — Z79891 Long term (current) use of opiate analgesic: Secondary | ICD-10-CM | POA: Diagnosis not present

## 2017-01-26 DIAGNOSIS — M542 Cervicalgia: Secondary | ICD-10-CM | POA: Diagnosis not present

## 2017-01-26 DIAGNOSIS — E1140 Type 2 diabetes mellitus with diabetic neuropathy, unspecified: Secondary | ICD-10-CM | POA: Diagnosis not present

## 2017-01-26 DIAGNOSIS — Z7984 Long term (current) use of oral hypoglycemic drugs: Secondary | ICD-10-CM | POA: Diagnosis not present

## 2017-01-26 DIAGNOSIS — Z7982 Long term (current) use of aspirin: Secondary | ICD-10-CM | POA: Diagnosis not present

## 2017-01-26 NOTE — Progress Notes (Signed)
Cardiology Office Note  Date: 01/28/2017   [name], DOB 1933/10/27, MRN [id number]  PCP: Curlene Labrum, MD  Primary Cardiologist: Rozann Lesches, MD   Chief Complaint  Patient presents with  . Cardiac follow-up    History of Present Illness: Adam Peck is an 83 y.o. male last seen in May. He presents with his daughter for a follow-up visit. We discussed interval symptoms and evaluation at [facility] He was evaluated with left shoulder and arm neuropathic pain, underwent various imaging studies and was seen by Dr. Carloyn Manner. He has been managed with steroids and there is no plan for specific surgical intervention. Also having trouble with prostatic hypertrophy requiring indwelling Foley catheter which was just removed by urology recently. Patient's daughter has noted that he has had fairly erratic heart rates recorded by home blood pressure monitor. He notices a vague sense of palpitations, has also had swelling in his legs which is dependent and worse at the end of the day. He is currently not on a diuretic.  I reviewed his ECG from [facility] which is outlined below. Repeat tracing today shows atrial fibrillation with RVR and nonspecific T-wave changes. We discussed the results today. CHADSVASC score is 6. We did discuss risk of stroke and potential use of anticoagulants, he was hesitant to pursue this. His daughter does mention that he is prone to falls and has intermittent hematuria as well. He states that he will stay on aspirin at this time.  I reviewed his medications which are outlined below.  Patient's daughter also mentions to me that he has had trouble with worsening dementia.  Past Medical History:  Diagnosis Date  . Anxiety   . Arthritis   . Carpal tunnel syndrome   . Chronic elbow pain, right   . Chronic neck pain   . Coronary atherosclerosis of native coronary artery    CABG 08/2012: LIMA-LAD; SVG-RI; SVG-OM1; SVG-PD; DES to the OM and RCA  2005  . ED (erectile dysfunction)   . Enlarged prostate   . Essential hypertension, benign   . GERD (gastroesophageal reflux disease)   . Headache(784.0)   . History of gout   . Hypothyroidism   . Mixed hyperlipidemia    Intolerant to statins  . Orthostatic hypotension   . Paroxysmal atrial fibrillation (HCC)    Postoperative  . Sinus bradycardia   . Tick bite 06/2012  . Type 2 diabetes mellitus ([place])     Past Surgical History:  Procedure Laterality Date  . CARDIAC CATHETERIZATION    . CARPAL TUNNEL RELEASE Right   . CATARACT EXTRACTION W/PHACO Left 05/21/2014   Procedure: CATARACT EXTRACTION PHACO AND INTRAOCULAR LENS PLACEMENT (IOC);  Surgeon: Tonny Branch, MD;  Location: [facility];  Service: Ophthalmology;  Laterality: Left;  CDE:8.18  . CATARACT EXTRACTION W/PHACO Right 06/07/2014   Procedure: CATARACT EXTRACTION PHACO AND INTRAOCULAR LENS PLACEMENT RIGHT;  Surgeon: Tonny Branch, MD;  Location: [facility];  Service: Ophthalmology;  Laterality: Right;  CDE:10.78  . CERVICAL FUSION    . COLONOSCOPY N/A 01/07/2017   Procedure: COLONOSCOPY;  Surgeon: Rogene Houston, MD;  [facility];  Service: Endoscopy;  Laterality: N/A;  1:00-rescheduled 8/30 @ 12:00 per Lelon Frohlich  . CORONARY ARTERY BYPASS GRAFT N/A 08/30/2012   Procedure: CORONARY ARTERY BYPASS GRAFTING (CABG);  Surgeon: Ivin Poot, MD;  Location: [place];  Service: Open Heart Surgery;  Laterality: N/A;  . HERNIA REPAIR    . INTRAOPERATIVE TRANSESOPHAGEAL ECHOCARDIOGRAM  N/A 08/30/2012   Procedure: INTRAOPERATIVE TRANSESOPHAGEAL ECHOCARDIOGRAM;  Surgeon: Ivin Poot, MD;  Location: [place];  Service: Open Heart Surgery;  Laterality: N/A;  . L4-5 laminectomy     x 2   . POLYPECTOMY  01/07/2017   Procedure: POLYPECTOMY;  Surgeon: Rogene Houston, MD;  [facility];  Service: Endoscopy;;  colon    Current Outpatient Prescriptions  Medication Sig Dispense Refill  . amLODipine (NORVASC) 5 MG tablet Take 5 mg by mouth  daily.    Marland Kitchen aspirin EC 81 MG tablet Take 81 mg by mouth daily.    . Aspirin-Salicylamide-Caffeine (BC FAST PAIN RELIEF) 650-195-33.3 MG PACK Take 1 packet by mouth daily as needed (for pain.).    Marland Kitchen HYDROcodone-acetaminophen (NORCO/VICODIN) 5-325 MG tablet Take 1 tablet by mouth every 4 (four) hours as needed for moderate pain.    Marland Kitchen levothyroxine (SYNTHROID, LEVOTHROID) 75 MCG tablet Take 75 mcg by mouth daily before breakfast.     . losartan (COZAAR) 25 MG tablet Take 25 mg by mouth daily.    Marland Kitchen lubiprostone (AMITIZA) 24 MCG capsule Take 1 capsule (24 mcg total) by mouth 2 (two) times daily with a meal. 60 capsule 5  . metFORMIN (GLUCOPHAGE) 500 MG tablet Take 500 mg by mouth 2 (two) times daily with a meal.    . NITROSTAT 0.4 MG SL tablet Take 0.4 tablets by mouth every 5 (five) minutes x 3 doses as needed for chest pain.     Marland Kitchen risperiDONE (RISPERDAL) 1 MG tablet Take 1 mg by mouth at bedtime.     . Tamsulosin HCl (FLOMAX) 0.4 MG CAPS Take 0.4 mg by mouth at bedtime.     Marland Kitchen amiodarone (PACERONE) 200 MG tablet Take 1 tablet (200 mg total) by mouth 2 (two) times daily. 180 tablet 3  . metoprolol tartrate (LOPRESSOR) 25 MG tablet Take 0.5 tablets (12.5 mg total) by mouth 2 (two) times daily. 90 tablet 3   No current facility-administered medications for this visit.    Allergies:  Ezetimibe and Statins   Social History: The patient  reports that he quit smoking about 48 years ago. His smoking use included Cigarettes. He has a 140.00 pack-year smoking history. He quit smokeless tobacco use about 38 years ago. His smokeless tobacco use included Chew. He reports that he does not drink alcohol or use drugs.   ROS:  Please see the history of present illness. Otherwise, complete review of systems is positive for hearing loss, urinary hesitancy and frequency.  All other systems are reviewed and negative.   Physical Exam: VS:  BP (!) 116/58   Pulse 85   Ht 5\' 10"  (1.778 m)   Wt 211 lb (95.7 kg)   SpO2  95%   BMI 30.28 kg/m , BMI Body mass index is 30.28 kg/m.  Wt Readings from Last 3 Encounters:  01/28/17 211 lb (95.7 kg)  01/07/17 206 lb (93.4 kg)  10/13/16 218 lb 8 oz (99.1 kg)    General: Elderly male in no distress.Marland Kitchen HEENT: Conjunctiva and lids normal, oropharynx clear. Neck: Supple, no elevated JVP or carotid bruits, no thyromegaly. Lungs: Decreased breath sounds without wheezing, nonlabored breathing at rest. Cardiac: Rapid, irregularly irregular, no S3, no pericardial rub. Abdomen: Protuberant, nontender, bowel sounds present, no guarding or rebound. Extremities: 1-2+ leg edema, distal pulses 2+. Skin: Warm and dry. Musculoskeletal: No kyphosis. Neuropsychiatric: Alert and oriented x2, affect grossly appropriate.  ECG: I personally reviewed the tracing from 01/25/2017 obtained at  [facility] which shows sinus rhythm with occasional PVCs and frequent PACs (misinterpreted by computer to show junctional rhythm).  Recent Labwork:  September 2018: BUN 17, creatinine 1.1, potassium 4.4, troponin T negative, hemoglobin 16.2, platelets 274  Other Studies Reviewed Today:  Echocardiogram 10/06/2012: Study Conclusions  - Left ventricle: The cavity size was normal. There was mild concentric hypertrophy. Systolic function was at the lower limits of normal. The estimated ejection fraction was in the range of 50% to 55%. There is hypokinesis of the anteroseptal myocardium. Doppler parameters are consistent with abnormal left ventricular relaxation (grade 1 diastolic dysfunction). Unable to compare directly with the previous study on this reading station. - Ventricular septum: Septal motion showed paradox. - Aortic valve: Mildly calcified annulus. Trileaflet; mildly calcified leaflets. Mild regurgitation. Valve area: 2.43cm^2 (Vmax). - Mitral valve: Mildly thickened leaflets . Trivial regurgitation. - Left atrium: The atrium was mildly dilated. - Right  ventricle: Systolic function was mildly reduced. - Tricuspid valve: Trivial regurgitation. - Pulmonary arteries: Systolic pressure could not be accurately estimated. - Inferior vena cava: Not visualized. Unable to estimate CVP. - Pericardium, extracardiac: A small pericardial effusion was identified posterior to the heart.  Assessment and Plan:  1. Atrial fibrillation with RVR. I suspect that he is having paroxysmal atrial fibrillation and this could be contributing to his intermittent leg swelling and also shortness of breath. CHADSVASC score is 6, although he is hesitant to pursue anticoagulation as noted above. For now he will stay on aspirin and low-dose beta blocker, Lopressor will be dosed 12.5 mg twice daily. Also starting amiodarone 200 mg twice daily with office follow-up planned in the next 2 weeks. Echocardiogram will be obtained to follow up on LVEF.  2. CAD status post CABG. He reports no active angina symptoms at this time. Troponin T levels were negative during recent hospital stay in [place].  3. Essential hypertension, blood pressure well controlled today.  4. Hyperlipidemia with statin intolerance.  Current medicines were reviewed with the patient today.   Orders Placed This Encounter  Procedures  . EKG 12-Lead  . ECHOCARDIOGRAM COMPLETE    Disposition: Follow-up in 2 weeks.  Signed, Satira Sark, MD, [facility] 01/28/2017 2:36 PM    [facility] at [facility] [address], [place], [place] [address] Phone: [phone]; Fax: [phone]

## 2017-01-27 DIAGNOSIS — Z79891 Long term (current) use of opiate analgesic: Secondary | ICD-10-CM | POA: Diagnosis not present

## 2017-01-27 DIAGNOSIS — Z7984 Long term (current) use of oral hypoglycemic drugs: Secondary | ICD-10-CM | POA: Diagnosis not present

## 2017-01-27 DIAGNOSIS — Z466 Encounter for fitting and adjustment of urinary device: Secondary | ICD-10-CM | POA: Diagnosis not present

## 2017-01-27 DIAGNOSIS — R338 Other retention of urine: Secondary | ICD-10-CM | POA: Diagnosis not present

## 2017-01-27 DIAGNOSIS — R339 Retention of urine, unspecified: Secondary | ICD-10-CM | POA: Diagnosis not present

## 2017-01-27 DIAGNOSIS — E039 Hypothyroidism, unspecified: Secondary | ICD-10-CM | POA: Diagnosis not present

## 2017-01-27 DIAGNOSIS — Z7982 Long term (current) use of aspirin: Secondary | ICD-10-CM | POA: Diagnosis not present

## 2017-01-27 DIAGNOSIS — M542 Cervicalgia: Secondary | ICD-10-CM | POA: Diagnosis not present

## 2017-01-27 DIAGNOSIS — G540 Brachial plexus disorders: Secondary | ICD-10-CM | POA: Diagnosis not present

## 2017-01-27 DIAGNOSIS — E1140 Type 2 diabetes mellitus with diabetic neuropathy, unspecified: Secondary | ICD-10-CM | POA: Diagnosis not present

## 2017-01-27 DIAGNOSIS — I10 Essential (primary) hypertension: Secondary | ICD-10-CM | POA: Diagnosis not present

## 2017-01-27 DIAGNOSIS — I2510 Atherosclerotic heart disease of native coronary artery without angina pectoris: Secondary | ICD-10-CM | POA: Diagnosis not present

## 2017-01-28 ENCOUNTER — Ambulatory Visit (INDEPENDENT_AMBULATORY_CARE_PROVIDER_SITE_OTHER): Payer: Medicare Other | Admitting: Cardiology

## 2017-01-28 ENCOUNTER — Encounter: Payer: Self-pay | Admitting: Cardiology

## 2017-01-28 VITALS — BP 116/58 | HR 85 | Ht 70.0 in | Wt 211.0 lb

## 2017-01-28 DIAGNOSIS — R0602 Shortness of breath: Secondary | ICD-10-CM | POA: Diagnosis not present

## 2017-01-28 DIAGNOSIS — Z789 Other specified health status: Secondary | ICD-10-CM

## 2017-01-28 DIAGNOSIS — I10 Essential (primary) hypertension: Secondary | ICD-10-CM | POA: Diagnosis not present

## 2017-01-28 DIAGNOSIS — I25119 Atherosclerotic heart disease of native coronary artery with unspecified angina pectoris: Secondary | ICD-10-CM | POA: Diagnosis not present

## 2017-01-28 DIAGNOSIS — I4891 Unspecified atrial fibrillation: Secondary | ICD-10-CM | POA: Diagnosis not present

## 2017-01-28 MED ORDER — METOPROLOL TARTRATE 25 MG PO TABS: 12.5000 mg | ORAL_TABLET | Freq: Two times a day (BID) | ORAL | 3 refills | Status: DC

## 2017-01-28 MED ORDER — AMIODARONE HCL 200 MG PO TABS: 200.0000 mg | ORAL_TABLET | Freq: Two times a day (BID) | ORAL | 3 refills | Status: AC

## 2017-01-28 NOTE — Patient Instructions (Signed)
Your physician recommends that you schedule a follow-up appointment in:  2 weeks   START Amiodarone 200 mg twice a day   DECREASE Lopressor to 12.5 mg twice a day   Continue all other medications    Your physician has requested that you have an echocardiogram at the [facility] office. Echocardiography is a painless test that uses sound waves to create images of your heart. It provides your doctor with information about the size and shape of your heart and how well your heart's chambers and valves are working. This procedure takes approximately one hour. There are no restrictions for this procedure.      Thank you for choosing [facility] !

## 2017-01-29 ENCOUNTER — Ambulatory Visit: Payer: Medicare Other | Admitting: Cardiology

## 2017-01-31 ENCOUNTER — Encounter (HOSPITAL_COMMUNITY): Payer: Self-pay | Admitting: Emergency Medicine

## 2017-01-31 ENCOUNTER — Emergency Department (HOSPITAL_COMMUNITY)
Admission: EM | Admit: 2017-01-31 | Discharge: 2017-01-31 | Disposition: A | Discharge status: Home / Self Care | Payer: Medicare Other | Attending: Emergency Medicine | Admitting: Emergency Medicine

## 2017-01-31 DIAGNOSIS — E1122 Type 2 diabetes mellitus with diabetic chronic kidney disease: Secondary | ICD-10-CM | POA: Insufficient documentation

## 2017-01-31 DIAGNOSIS — I2510 Atherosclerotic heart disease of native coronary artery without angina pectoris: Secondary | ICD-10-CM | POA: Diagnosis not present

## 2017-01-31 DIAGNOSIS — I129 Hypertensive chronic kidney disease with stage 1 through stage 4 chronic kidney disease, or unspecified chronic kidney disease: Secondary | ICD-10-CM | POA: Insufficient documentation

## 2017-01-31 DIAGNOSIS — Z7982 Long term (current) use of aspirin: Secondary | ICD-10-CM | POA: Insufficient documentation

## 2017-01-31 DIAGNOSIS — Z87891 Personal history of nicotine dependence: Secondary | ICD-10-CM | POA: Diagnosis not present

## 2017-01-31 DIAGNOSIS — Z79899 Other long term (current) drug therapy: Secondary | ICD-10-CM | POA: Diagnosis not present

## 2017-01-31 DIAGNOSIS — R339 Retention of urine, unspecified: Secondary | ICD-10-CM | POA: Insufficient documentation

## 2017-01-31 DIAGNOSIS — N182 Chronic kidney disease, stage 2 (mild): Secondary | ICD-10-CM | POA: Insufficient documentation

## 2017-01-31 DIAGNOSIS — Z7984 Long term (current) use of oral hypoglycemic drugs: Secondary | ICD-10-CM | POA: Insufficient documentation

## 2017-01-31 LAB — URINALYSIS, ROUTINE W REFLEX MICROSCOPIC
BILIRUBIN URINE: NEGATIVE
GLUCOSE, UA: 150 mg/dL — AB
KETONES UR: NEGATIVE mg/dL
LEUKOCYTES UA: NEGATIVE
NITRITE: NEGATIVE
PH: 5 (ref 5.0–8.0)
Protein, ur: NEGATIVE mg/dL
SPECIFIC GRAVITY, URINE: 1.012 (ref 1.005–1.030)
Squamous Epithelial / LPF: NONE SEEN

## 2017-01-31 NOTE — ED Provider Notes (Signed)
[facility] DEPT Provider Note   CSN: [id number] Arrival date & time: 01/31/17  0809     History   Chief Complaint Chief Complaint  Patient presents with  . Urinary Retention    HPI Adam Peck is a 83 y.o. male.  Minimal urinary output since Wednesday when his Foley catheter was removed.  Catheter had been placed 13 days prior for similar complaints of urinary retention.  He has had BPH for years was evaluated by [facility].No fever, sweats, chills, flank pain. Severity of symptoms is moderate.        Past Medical History:  Diagnosis Date  . Anxiety   . Arthritis   . Carpal tunnel syndrome   . Chronic elbow pain, right   . Chronic neck pain   . Coronary atherosclerosis of native coronary artery    CABG 08/2012: LIMA-LAD; SVG-RI; SVG-OM1; SVG-PD; DES to the OM and RCA 2005  . ED (erectile dysfunction)   . Enlarged prostate   . Essential hypertension, benign   . GERD (gastroesophageal reflux disease)   . Headache(784.0)   . History of gout   . Hypothyroidism   . Mixed hyperlipidemia    Intolerant to statins  . Orthostatic hypotension   . Paroxysmal atrial fibrillation (HCC)    Postoperative  . Sinus bradycardia   . Tick bite 06/2012  . Type 2 diabetes mellitus [facility])     Patient Active Problem List   Diagnosis Date Noted  . Encounter for screening colonoscopy 10/13/2016  . Paroxysmal atrial fibrillation (HCC)   . Diabetes ([place]) 08/25/2012  . CKD (chronic kidney disease) stage 2, GFR 60-89 ml/min 08/11/2012  . DYSLIPIDEMIA 03/28/2009  . Essential hypertension, benign 03/28/2009  . Coronary atherosclerosis of native coronary artery 03/28/2009    Past Surgical History:  Procedure Laterality Date  . CARDIAC CATHETERIZATION    . CARPAL TUNNEL RELEASE Right   . CATARACT EXTRACTION W/PHACO Left 05/21/2014   Procedure: CATARACT EXTRACTION PHACO AND INTRAOCULAR LENS PLACEMENT (IOC);  Surgeon: Tonny Branch, MD;  Location: [facility];  Service: Ophthalmology;   Laterality: Left;  CDE:8.18  . CATARACT EXTRACTION W/PHACO Right 06/07/2014   Procedure: CATARACT EXTRACTION PHACO AND INTRAOCULAR LENS PLACEMENT RIGHT;  Surgeon: Tonny Branch, MD;  Location: [facility];  Service: Ophthalmology;  Laterality: Right;  CDE:10.78  . CERVICAL FUSION    . COLONOSCOPY N/A 01/07/2017   Procedure: COLONOSCOPY;  Surgeon: Rogene Houston, MD;  [facility];  Service: Endoscopy;  Laterality: N/A;  1:00-rescheduled 8/30 @ 12:00 per Lelon Frohlich  . CORONARY ARTERY BYPASS GRAFT N/A 08/30/2012   Procedure: CORONARY ARTERY BYPASS GRAFTING (CABG);  Surgeon: Ivin Poot, MD;  Location: [place];  Service: Open Heart Surgery;  Laterality: N/A;  . HERNIA REPAIR    . INTRAOPERATIVE TRANSESOPHAGEAL ECHOCARDIOGRAM N/A 08/30/2012   Procedure: INTRAOPERATIVE TRANSESOPHAGEAL ECHOCARDIOGRAM;  Surgeon: Ivin Poot, MD;  Location: [place];  Service: Open Heart Surgery;  Laterality: N/A;  . L4-5 laminectomy     x 2   . POLYPECTOMY  01/07/2017   Procedure: POLYPECTOMY;  Surgeon: Rogene Houston, MD;  [facility];  Service: Endoscopy;;  colon       Home Medications    Prior to Admission medications   Medication Sig Start Date End Date Taking? Authorizing Provider  amiodarone (PACERONE) 200 MG tablet Take 1 tablet (200 mg total) by mouth 2 (two) times daily. 01/28/17   Satira Sark, MD  amLODipine (NORVASC) 5 MG tablet Take 5  mg by mouth daily.    [provider]  aspirin EC 81 MG tablet Take 81 mg by mouth daily.    [provider]  Aspirin-Salicylamide-Caffeine (BC FAST PAIN RELIEF) 650-195-33.3 MG PACK Take 1 packet by mouth daily as needed (for pain.).    [provider]  HYDROcodone-acetaminophen (NORCO/VICODIN) 5-325 MG tablet Take 1 tablet by mouth every 4 (four) hours as needed for moderate pain.    [provider]  levothyroxine (SYNTHROID, LEVOTHROID) 75 MCG tablet Take 75 mcg by mouth daily before breakfast.     [provider]  losartan (COZAAR) 25 MG tablet Take 25 mg by mouth daily.    [provider]  lubiprostone (AMITIZA) 24 MCG capsule Take 1 capsule (24 mcg total) by mouth 2 (two) times daily with a meal. 01/07/17   Rehman, Mechele Dawley, MD  metFORMIN (GLUCOPHAGE) 500 MG tablet Take 500 mg by mouth 2 (two) times daily with a meal.    [provider]  metoprolol tartrate (LOPRESSOR) 25 MG tablet Take 0.5 tablets (12.5 mg total) by mouth 2 (two) times daily. 01/28/17 04/28/17  Satira Sark, MD  NITROSTAT 0.4 MG SL tablet Take 0.4 tablets by mouth every 5 (five) minutes x 3 doses as needed for chest pain.  08/13/12   [provider]  risperiDONE (RISPERDAL) 1 MG tablet Take 1 mg by mouth at bedtime.  01/25/17   [provider]  Tamsulosin HCl (FLOMAX) 0.4 MG CAPS Take 0.4 mg by mouth at bedtime.     [provider]    Family History Family History  Problem Relation Age of Onset  . Colon cancer Neg Hx     Social History Social History  Substance Use Topics  . Smoking status: Former Smoker    Packs/day: 4.00    Years: 35.00    Types: Cigarettes    Quit date: 05/11/1968  . Smokeless tobacco: Former Systems developer    Types: Chew    Quit date: 05/11/1978  . Alcohol use No     Allergies   Ezetimibe and Statins   Review of Systems Review of Systems  All other systems reviewed and are negative.    Physical Exam Updated Vital Signs BP 96/68   Pulse 100   Temp (!) 97.5 F (36.4 C) (Oral)   Resp 20   Ht 5\' 10"  (1.778 m)   Wt 95.7 kg (211 lb)   SpO2 94%   BMI 30.28 kg/m   Physical Exam  Constitutional: He is oriented to person, place, and time. He appears well-developed and well-nourished.  HENT:  Head: Normocephalic and atraumatic.  Eyes: Conjunctivae are normal.  Neck: Neck supple.  Cardiovascular: Normal rate and regular rhythm.   Pulmonary/Chest: Effort normal and breath sounds normal.  Abdominal: Soft. Bowel sounds are normal.    Genitourinary:  Genitourinary Comments: No flank tenderness  Musculoskeletal: Normal range of motion.  Neurological: He is alert and oriented to person, place, and time.  Skin: Skin is warm and dry.  Psychiatric: He has a normal mood and affect. His behavior is normal.  Nursing note and vitals reviewed.    ED Treatments / Results  Labs (all labs ordered are listed, but only abnormal results are displayed) Labs Reviewed  URINALYSIS, ROUTINE W REFLEX MICROSCOPIC - Abnormal; Notable for the following:       Result Value   Glucose, UA 150 (*)    Hgb urine dipstick MODERATE (*)    Bacteria, UA RARE (*)  All other components within normal limits    EKG  EKG Interpretation None       Radiology No results found.  Procedures Procedures (including critical care time)  Medications Ordered in ED Medications - No data to display   Initial Impression / Assessment and Plan / ED Course  I have reviewed the triage vital signs and the nursing notes.  Pertinent labs & imaging results that were available during my care of the patient were reviewed by me and considered in my medical decision making (see chart for details).     Foley catheter placed by RN. Patient had good urinary flow. Catheter will remain in place. He will follow-up with urology.  Final Clinical Impressions(s) / ED Diagnoses   Final diagnoses:  Urinary retention    New Prescriptions Discharge Medication List as of 01/31/2017 10:10 AM       Nat Christen, MD 01/31/17 1044

## 2017-01-31 NOTE — ED Triage Notes (Addendum)
Pt family reports pt had catheter removed on Wednesday. Pt has had minimal voiding since catheter was removed. Pt family reports enlarged prostate and is currently being worked up for congestive heart failure. Per family Echo is scheduled for Thursday.

## 2017-01-31 NOTE — Discharge Instructions (Signed)
Follow-up with [facility]. Keep urinary catheter in until that visit.

## 2017-01-31 NOTE — ED Notes (Signed)
>   910 ML of urine noted in bladder per bladder scan.

## 2017-01-31 NOTE — ED Notes (Addendum)
Patients daughter reports patient has redness to face after starting 2 new mediations.   Cefpodoxine Risperidone

## 2017-02-01 DIAGNOSIS — E1140 Type 2 diabetes mellitus with diabetic neuropathy, unspecified: Secondary | ICD-10-CM | POA: Diagnosis not present

## 2017-02-01 DIAGNOSIS — Z466 Encounter for fitting and adjustment of urinary device: Secondary | ICD-10-CM | POA: Diagnosis not present

## 2017-02-01 DIAGNOSIS — Z7982 Long term (current) use of aspirin: Secondary | ICD-10-CM | POA: Diagnosis not present

## 2017-02-01 DIAGNOSIS — I2510 Atherosclerotic heart disease of native coronary artery without angina pectoris: Secondary | ICD-10-CM | POA: Diagnosis not present

## 2017-02-01 DIAGNOSIS — Z7984 Long term (current) use of oral hypoglycemic drugs: Secondary | ICD-10-CM | POA: Diagnosis not present

## 2017-02-01 DIAGNOSIS — M542 Cervicalgia: Secondary | ICD-10-CM | POA: Diagnosis not present

## 2017-02-01 DIAGNOSIS — E039 Hypothyroidism, unspecified: Secondary | ICD-10-CM | POA: Diagnosis not present

## 2017-02-01 DIAGNOSIS — G540 Brachial plexus disorders: Secondary | ICD-10-CM | POA: Diagnosis not present

## 2017-02-01 DIAGNOSIS — R339 Retention of urine, unspecified: Secondary | ICD-10-CM | POA: Diagnosis not present

## 2017-02-01 DIAGNOSIS — I10 Essential (primary) hypertension: Secondary | ICD-10-CM | POA: Diagnosis not present

## 2017-02-01 DIAGNOSIS — Z79891 Long term (current) use of opiate analgesic: Secondary | ICD-10-CM | POA: Diagnosis not present

## 2017-02-02 ENCOUNTER — Emergency Department (HOSPITAL_COMMUNITY)
Admission: EM | Admit: 2017-02-02 | Discharge: 2017-02-02 | Disposition: A | Discharge status: Home / Self Care | Payer: Medicare Other | Attending: Emergency Medicine | Admitting: Emergency Medicine

## 2017-02-02 ENCOUNTER — Emergency Department (HOSPITAL_COMMUNITY): Payer: Medicare Other

## 2017-02-02 ENCOUNTER — Encounter (HOSPITAL_COMMUNITY): Payer: Self-pay | Admitting: *Deleted

## 2017-02-02 ENCOUNTER — Telehealth: Payer: Self-pay

## 2017-02-02 ENCOUNTER — Encounter: Payer: Self-pay | Admitting: Cardiology

## 2017-02-02 DIAGNOSIS — E1140 Type 2 diabetes mellitus with diabetic neuropathy, unspecified: Secondary | ICD-10-CM | POA: Diagnosis not present

## 2017-02-02 DIAGNOSIS — N182 Chronic kidney disease, stage 2 (mild): Secondary | ICD-10-CM | POA: Diagnosis not present

## 2017-02-02 DIAGNOSIS — N390 Urinary tract infection, site not specified: Secondary | ICD-10-CM | POA: Diagnosis not present

## 2017-02-02 DIAGNOSIS — Z7984 Long term (current) use of oral hypoglycemic drugs: Secondary | ICD-10-CM | POA: Diagnosis not present

## 2017-02-02 DIAGNOSIS — I10 Essential (primary) hypertension: Secondary | ICD-10-CM | POA: Diagnosis not present

## 2017-02-02 DIAGNOSIS — R000 Tachycardia, unspecified: Secondary | ICD-10-CM | POA: Diagnosis present

## 2017-02-02 DIAGNOSIS — E039 Hypothyroidism, unspecified: Secondary | ICD-10-CM | POA: Diagnosis not present

## 2017-02-02 DIAGNOSIS — Z79899 Other long term (current) drug therapy: Secondary | ICD-10-CM | POA: Diagnosis not present

## 2017-02-02 DIAGNOSIS — I2510 Atherosclerotic heart disease of native coronary artery without angina pectoris: Secondary | ICD-10-CM | POA: Insufficient documentation

## 2017-02-02 DIAGNOSIS — G540 Brachial plexus disorders: Secondary | ICD-10-CM | POA: Diagnosis not present

## 2017-02-02 DIAGNOSIS — R0602 Shortness of breath: Secondary | ICD-10-CM | POA: Diagnosis not present

## 2017-02-02 DIAGNOSIS — I129 Hypertensive chronic kidney disease with stage 1 through stage 4 chronic kidney disease, or unspecified chronic kidney disease: Secondary | ICD-10-CM | POA: Insufficient documentation

## 2017-02-02 DIAGNOSIS — Z466 Encounter for fitting and adjustment of urinary device: Secondary | ICD-10-CM | POA: Diagnosis not present

## 2017-02-02 DIAGNOSIS — I4891 Unspecified atrial fibrillation: Secondary | ICD-10-CM | POA: Diagnosis not present

## 2017-02-02 DIAGNOSIS — Z87891 Personal history of nicotine dependence: Secondary | ICD-10-CM | POA: Diagnosis not present

## 2017-02-02 DIAGNOSIS — I4901 Ventricular fibrillation: Secondary | ICD-10-CM | POA: Diagnosis not present

## 2017-02-02 DIAGNOSIS — R339 Retention of urine, unspecified: Secondary | ICD-10-CM | POA: Diagnosis not present

## 2017-02-02 DIAGNOSIS — R0600 Dyspnea, unspecified: Secondary | ICD-10-CM | POA: Diagnosis not present

## 2017-02-02 DIAGNOSIS — Z79891 Long term (current) use of opiate analgesic: Secondary | ICD-10-CM | POA: Diagnosis not present

## 2017-02-02 DIAGNOSIS — Z7982 Long term (current) use of aspirin: Secondary | ICD-10-CM | POA: Insufficient documentation

## 2017-02-02 DIAGNOSIS — M542 Cervicalgia: Secondary | ICD-10-CM | POA: Diagnosis not present

## 2017-02-02 DIAGNOSIS — E1122 Type 2 diabetes mellitus with diabetic chronic kidney disease: Secondary | ICD-10-CM | POA: Insufficient documentation

## 2017-02-02 LAB — BASIC METABOLIC PANEL
Anion gap: 10 (ref 5–15)
BUN: 29 mg/dL — ABNORMAL HIGH (ref 6–20)
CO2: 23 mmol/L (ref 22–32)
Calcium: 8.3 mg/dL — ABNORMAL LOW (ref 8.9–10.3)
Chloride: 96 mmol/L — ABNORMAL LOW (ref 101–111)
Creatinine, Ser: 1.32 mg/dL — ABNORMAL HIGH (ref 0.61–1.24)
GFR calc Af Amer: 57 mL/min — ABNORMAL LOW (ref >60)
GFR calc non Af Amer: 49 mL/min — ABNORMAL LOW (ref >60)
Glucose, Bld: 407 mg/dL — ABNORMAL HIGH (ref 65–99)
Potassium: 4.5 mmol/L (ref 3.5–5.1)
Sodium: 129 mmol/L — ABNORMAL LOW (ref 135–145)

## 2017-02-02 LAB — CBC WITH DIFFERENTIAL/PLATELET
Basophils Absolute: 0 10*3/uL (ref 0.0–0.1)
Basophils Relative: 0 %
Eosinophils Absolute: 0 10*3/uL (ref 0.0–0.7)
Eosinophils Relative: 0 %
HCT: 40.4 % (ref 39.0–52.0)
Hemoglobin: 13.8 g/dL (ref 13.0–17.0)
Lymphocytes Relative: 3 %
Lymphs Abs: 0.5 10*3/uL — ABNORMAL LOW (ref 0.7–4.0)
MCH: 29.1 pg (ref 26.0–34.0)
MCHC: 34.2 g/dL (ref 30.0–36.0)
MCV: 85.2 fL (ref 78.0–100.0)
Monocytes Absolute: 0.1 10*3/uL (ref 0.1–1.0)
Monocytes Relative: 1 %
Neutro Abs: 16.4 10*3/uL — ABNORMAL HIGH (ref 1.7–7.7)
Neutrophils Relative %: 96 %
Platelets: 153 10*3/uL (ref 150–400)
RBC: 4.74 MIL/uL (ref 4.22–5.81)
RDW: 14.1 % (ref 11.5–15.5)
WBC: 17.1 10*3/uL — ABNORMAL HIGH (ref 4.0–10.5)

## 2017-02-02 LAB — URINALYSIS, ROUTINE W REFLEX MICROSCOPIC
Bilirubin Urine: NEGATIVE
Glucose, UA: >=500 mg/dL — AB
Ketones, ur: NEGATIVE mg/dL
Nitrite: POSITIVE — AB
Protein, ur: NEGATIVE mg/dL
Specific Gravity, Urine: 1.022 (ref 1.005–1.030)
pH: 5 (ref 5.0–8.0)

## 2017-02-02 LAB — TROPONIN I: Troponin I: <0.03 ng/mL (ref <0.03)

## 2017-02-02 LAB — BRAIN NATRIURETIC PEPTIDE: B Natriuretic Peptide: 183 pg/mL — ABNORMAL HIGH (ref 0.0–100.0)

## 2017-02-02 MED ORDER — DEXTROSE 5 % IV SOLN
1.0000 g | Freq: Once | INTRAVENOUS | Status: AC
  Administered 2017-02-02: 1 g via INTRAVENOUS
  Filled 2017-02-02: qty 10

## 2017-02-02 MED ORDER — CEPHALEXIN 500 MG PO CAPS: 500.0000 mg | ORAL_CAPSULE | Freq: Three times a day (TID) | ORAL | 0 refills | Status: DC

## 2017-02-02 MED ORDER — INSULIN ASPART 100 UNIT/ML ~~LOC~~ SOLN
8.0000 [IU] | Freq: Once | SUBCUTANEOUS | Status: AC
  Administered 2017-02-02: 8 [IU] via INTRAVENOUS
  Filled 2017-02-02: qty 1

## 2017-02-02 MED ORDER — METOPROLOL TARTRATE 5 MG/5ML IV SOLN
2.5000 mg | Freq: Once | INTRAVENOUS | Status: AC
  Administered 2017-02-02: 2.5 mg via INTRAVENOUS
  Filled 2017-02-02: qty 5

## 2017-02-02 NOTE — ED Provider Notes (Signed)
[facility] DEPT Provider Note   CSN: [id number] Arrival date & time: 02/02/17  1524     History   Chief Complaint Chief Complaint  Patient presents with  . Tachycardia    HPI Adam Peck is a 83 y.o. male.  HPI   83 year old male with palpitations. Recently diagnosed atrial fibrillation. He was seen by cardiology on 01/28/2017. At that time he had an EKG with A. fib with rapid ventricular response. He was started on 12.5 mg of Lopressor twice a day and 200 mg of amiodarone twice daily. CHADSVASC score 6. He is on aspirin only because of fall risk and previous issues with hematuria. ECHO scheduled in two days.  He reports that the palpitations are intermittent. Denies any chest pain with it. Does feel somewhat short of breath when his heart is racing. Mild increase in lower extremity edema. His daughters at bedside. She reports that his heart rate has been very erratic sometimes increasing to 160-180 BPM.  Past Medical History:  Diagnosis Date  . Anxiety   . Arthritis   . Carpal tunnel syndrome   . Chronic elbow pain, right   . Chronic neck pain   . Coronary atherosclerosis of native coronary artery    CABG 08/2012: LIMA-LAD; SVG-RI; SVG-OM1; SVG-PD; DES to the OM and RCA 2005  . ED (erectile dysfunction)   . Enlarged prostate   . Essential hypertension, benign   . GERD (gastroesophageal reflux disease)   . Headache(784.0)   . History of gout   . Hypothyroidism   . Mixed hyperlipidemia    Intolerant to statins  . Orthostatic hypotension   . Paroxysmal atrial fibrillation (HCC)    Postoperative  . Sinus bradycardia   . Tick bite 06/2012  . Type 2 diabetes mellitus [facility])     Patient Active Problem List   Diagnosis Date Noted  . Encounter for screening colonoscopy 10/13/2016  . Paroxysmal atrial fibrillation (HCC)   . Diabetes ([place]) 08/25/2012  . CKD (chronic kidney disease) stage 2, GFR 60-89 ml/min 08/11/2012  . DYSLIPIDEMIA 03/28/2009  . Essential  hypertension, benign 03/28/2009  . Coronary atherosclerosis of native coronary artery 03/28/2009    Past Surgical History:  Procedure Laterality Date  . CARDIAC CATHETERIZATION    . CARPAL TUNNEL RELEASE Right   . CATARACT EXTRACTION W/PHACO Left 05/21/2014   Procedure: CATARACT EXTRACTION PHACO AND INTRAOCULAR LENS PLACEMENT (IOC);  Surgeon: Tonny Branch, MD;  Location: [facility] ORS;  Service: Ophthalmology;  Laterality: Left;  CDE:8.18  . CATARACT EXTRACTION W/PHACO Right 06/07/2014   Procedure: CATARACT EXTRACTION PHACO AND INTRAOCULAR LENS PLACEMENT RIGHT;  Surgeon: Tonny Branch, MD;  Location: [facility] ORS;  Service: Ophthalmology;  Laterality: Right;  CDE:10.78  . CERVICAL FUSION    . COLONOSCOPY N/A 01/07/2017   Procedure: COLONOSCOPY;  Surgeon: Rogene Houston, MD;  Location: [facility] ENDO SUITE;  Service: Endoscopy;  Laterality: N/A;  1:00-rescheduled 8/30 @ 12:00 per Lelon Frohlich  . CORONARY ARTERY BYPASS GRAFT N/A 08/30/2012   Procedure: CORONARY ARTERY BYPASS GRAFTING (CABG);  Surgeon: Ivin Poot, MD;  Location: [facility];  Service: Open Heart Surgery;  Laterality: N/A;  . HERNIA REPAIR    . INTRAOPERATIVE TRANSESOPHAGEAL ECHOCARDIOGRAM N/A 08/30/2012   Procedure: INTRAOPERATIVE TRANSESOPHAGEAL ECHOCARDIOGRAM;  Surgeon: Ivin Poot, MD;  Location: [facility];  Service: Open Heart Surgery;  Laterality: N/A;  . L4-5 laminectomy     x 2   . POLYPECTOMY  01/07/2017   Procedure: POLYPECTOMY;  Surgeon: Rogene Houston, MD;  Location: [facility] END[facility];  Service: Endoscopy;;  colon       Home Medications    Prior to Admission medications   Medication Sig Start Date End Date Taking? Authorizing Provider  amiodarone (PACERONE) 200 MG tablet Take 1 tablet (200 mg total) by mouth 2 (two) times daily. 01/28/17   Satira Sark, MD  amLODipine (NORVASC) 5 MG tablet Take 5 mg by mouth daily.    [provider]  aspirin EC 81 MG tablet Take 81 mg by mouth daily.    [provider]    Aspirin-Salicylamide-Caffeine (BC FAST PAIN RELIEF) 650-195-33.3 MG PACK Take 1 packet by mouth daily as needed (for pain.).    [provider]  HYDROcodone-acetaminophen (NORCO/VICODIN) 5-325 MG tablet Take 1 tablet by mouth every 4 (four) hours as needed for moderate pain.    [provider]  levothyroxine (SYNTHROID, LEVOTHROID) 75 MCG tablet Take 75 mcg by mouth daily before breakfast.     [provider]  losartan (COZAAR) 25 MG tablet Take 25 mg by mouth daily.    [provider]  lubiprostone (AMITIZA) 24 MCG capsule Take 1 capsule (24 mcg total) by mouth 2 (two) times daily with a meal. 01/07/17   Rehman, Mechele Dawley, MD  metFORMIN (GLUCOPHAGE) 500 MG tablet Take 500 mg by mouth 2 (two) times daily with a meal.    [provider]  metoprolol tartrate (LOPRESSOR) 25 MG tablet Take 0.5 tablets (12.5 mg total) by mouth 2 (two) times daily. 01/28/17 04/28/17  Satira Sark, MD  NITROSTAT 0.4 MG SL tablet Take 0.4 tablets by mouth every 5 (five) minutes x 3 doses as needed for chest pain.  08/13/12   [provider]  risperiDONE (RISPERDAL) 1 MG tablet Take 1 mg by mouth at bedtime.  01/25/17   [provider]  Tamsulosin HCl (FLOMAX) 0.4 MG CAPS Take 0.4 mg by mouth at bedtime.     [provider]    Family History Family History  Problem Relation Age of Onset  . Colon cancer Neg Hx     Social History Social History  Substance Use Topics  . Smoking status: Former Smoker    Packs/day: 4.00    Years: 35.00    Types: Cigarettes    Quit date: 05/11/1968  . Smokeless tobacco: Former Systems developer    Types: Chew    Quit date: 05/11/1978  . Alcohol use No     Allergies   Ezetimibe and Statins   Review of Systems Review of Systems  All systems reviewed and negative, other than as noted in HPI.  Physical Exam Updated Vital Signs BP 110/80   Pulse (!) 167   Temp 98.1 F (36.7 C) (Oral)   Resp 16   Ht 5\' 10"  (1.778 m)    Wt 95.7 kg (211 lb)   SpO2 94%   BMI 30.28 kg/m   Physical Exam  Constitutional: He appears well-developed and well-nourished. No distress.  HENT:  Head: Normocephalic and atraumatic.  Eyes: Conjunctivae are normal. Right eye exhibits no discharge. Left eye exhibits no discharge.  Neck: Neck supple.  Cardiovascular: Normal heart sounds.  Exam reveals no gallop and no friction rub.   No murmur heard. Tachycardic. irreg irreg.   Pulmonary/Chest: Effort normal and breath sounds normal. No respiratory distress.  Abdominal: Soft. He exhibits no distension. There is no tenderness.  Musculoskeletal: He exhibits edema. He exhibits no tenderness.  Mild symmetric LE edema. No calf tenderness.  Neurological: He is alert.  Skin: Skin is warm and dry.  Psychiatric: He has a normal mood and affect. His behavior is normal. Thought content normal.  Nursing note and vitals reviewed.    ED Treatments / Results  Labs (all labs ordered are listed, but only abnormal results are displayed) Labs Reviewed  CBC WITH DIFFERENTIAL/PLATELET - Abnormal; Notable for the following:       Result Value   WBC 17.1 (*)    Neutro Abs 16.4 (*)    Lymphs Abs 0.5 (*)    All other components within normal limits  BASIC METABOLIC PANEL - Abnormal; Notable for the following:    Sodium 129 (*)    Chloride 96 (*)    Glucose, Bld 407 (*)    BUN 29 (*)    Creatinine, Ser 1.32 (*)    Calcium 8.3 (*)    GFR calc non Af Amer 49 (*)    GFR calc Af Amer 57 (*)    All other components within normal limits  BRAIN NATRIURETIC PEPTIDE - Abnormal; Notable for the following:    B Natriuretic Peptide 183.0 (*)    All other components within normal limits  URINALYSIS, ROUTINE W REFLEX MICROSCOPIC - Abnormal; Notable for the following:    APPearance CLOUDY (*)    Glucose, UA >=500 (*)    Hgb urine dipstick MODERATE (*)    Nitrite POSITIVE (*)    Leukocytes, UA MODERATE (*)    Bacteria, UA FEW (*)    Squamous  Epithelial / LPF 0-5 (*)    All other components within normal limits  URINE CULTURE  TROPONIN I    EKG  EKG Interpretation  Date/Time:  Tuesday February 02 2017 15:47:48 EDT Ventricular Rate:  154 PR Interval:    QRS Duration: 104 QT Interval:  314 QTC Calculation: 502 R Axis:   -33 Text Interpretation:  atrial fibrillation with rapid ventricular response Left axis deviation Nonspecific ST and T wave abnormality Abnormal ECG Confirmed by Virgel Manifold [phone]) on 02/02/2017 7:11:56 PM       Radiology Dg Chest 2 View  Result Date: 02/02/2017 CLINICAL DATA:  Dyspnea. Heart fluttering for 1 week. History of atrial fibrillation. EXAM: CHEST  2 VIEW COMPARISON:  10/18/2012 radiographs. FINDINGS: The heart size and mediastinal contours are stable status post median sternotomy and CABG. Left basilar scarring or atelectasis is similar to the prior study. There is no edema, confluent airspace opacity, pleural effusion or pneumothorax. No acute osseous findings are seen. IMPRESSION: Stable postoperative chest.  No acute cardiopulmonary process. Electronically Signed   By: Richardean Sale M.D.   On: 02/02/2017 16:50    Procedures Procedures (including critical care time)  Medications Ordered in ED Medications  metoprolol tartrate (LOPRESSOR) injection 2.5 mg (2.5 mg Intravenous Given 02/02/17 1617)  insulin aspart (novoLOG) injection 8 Units (8 Units Intravenous Given 02/02/17 1828)  cefTRIAXone (ROCEPHIN) 1 g in dextrose 5 % 50 mL IVPB (0 g Intravenous Stopped 02/02/17 1903)     Initial Impression / Assessment and Plan / ED Course  I have reviewed the triage vital signs and the nursing notes.  Pertinent labs & imaging results that were available during my care of the patient were reviewed by me and considered in my medical decision making (see chart for details).     83 year old male with palpitations. Atrial fibrillation with rapid ventricular response. His daughter seems much more  concerned than the patient himself. He does endorse palpitations and says he  feels more tired when he feels like his heart rate is increased. His initial EKG with a ventricular rate in the 150s. During my initial exam his heart rate was in the 80-110 range. Doesn't appear distressed and reports no symptoms currently while at rest. He was just started on amiodarone and metoprolol five days ago and scheduled for ECHO in two days. Denies any pain.  Poorly controlled A. fib may be due to into his lower extremity edema though he denies shortness of breath.  Currently on amiodarone and low-dose metoprolol. Explained to his daughter that with A. fib he may occasionally have spikes in his heart rate and the goal is to keep it in a reasonable range and largely asymptomatic. Thus far, aside from when he initially arrived to the ED, his HR has been pretty reasonable. Will give give low dose metoprolol in ED and observe while labs and CXR obtained.    BNP is only mildly elevated. Chest x-ray without acute abnormality. Interestingly, he is pretty hyperglycemic with blood sugar in the 400s. He is not acidotic. He also has a fairly significant leukocytosis. We'll check a rectal temperature. No specific urinary complaints now but with recent catheter for urinary retention he may potentially have a UTI. UTIs in elderly population often with more vague symptoms (fatigue, not feeling quite right, etc). Mild hyponatremia which is probably a combination of a pseudohyponatremia and possibly a little fluid overload contributing as well.   Does have UTI. Daughter says that actually currently being treated for UTI since Friday. She is not sure of what antibiotic and I do not have ready access to urology notes or culture data.  Urine on 9/23 looked clean. Will send urine culture today. Dose of rocephin in ED. Prescription for keflex.   HR has been better for past 2 hours (~70-90). He has no additional complaints. Will have  temporarily increase metoprolol to 25mg  in morning and keep evening dose at 12.5mg . Continue amiodarone. Has ECHO on Thursday. Follow-up on Monday with Dr Domenic Polite as scheduled and discuss further. Emergent return precautions discussed.   Final Clinical Impressions(s) / ED Diagnoses   Final diagnoses:  Atrial fibrillation with RVR ([place])  Urinary tract infection without hematuria, site unspecified    New Prescriptions New Prescriptions   No medications on file     Virgel Manifold, MD 02/02/17 [phone]

## 2017-02-02 NOTE — Telephone Encounter (Signed)
-----   Message from Satira Sark, MD sent at 02/02/2017 11:21 AM EDT ----- Regarding: RE: recent ED visit If he is going downhill clinically and heart rates are poorly controlled as described, it may be best that he is in the hospital for further stabilization and management of his medications.  ----- Message ----- From: Bernita Raisin, RN Sent: 02/02/2017  10:13 AM To: Satira Sark, MD Subject: recent ED visit                                Daughter sent e-mail to say father was seen in ED on Sunday for inability to urinate,  foley cath placed . She  Said his HR was 160-170 and then down to 20.ED sent them home and told her to f/u as scheduled.He has echo in 2 days.She wanted you to know he is going down hill

## 2017-02-02 NOTE — Telephone Encounter (Signed)
Daughter wants him direct admitted to hospital, not go through ED,states she does not want to go that road.

## 2017-02-02 NOTE — ED Triage Notes (Addendum)
Pt's c/o "heart fluttering" x 1 week. Pt has hx of A. Fib. Denies chest pain, SOB, dizziness, lightheadedness. Pt's daughter reports HR is fluctuating from 20-160bpm at home and pt has been having increased pitting edema to bilateral lower extremities. Cardiologist was called and pt was told to come to ED for eval. HR 150bpm in triage.

## 2017-02-02 NOTE — ED Notes (Signed)
ED Provider at bedside. 

## 2017-02-02 NOTE — Discharge Instructions (Signed)
Increase metoprolol to 25 mg in morning and keep 12.5mg  dose in evening the same. Continue amiodarone as previously prescribed. Discuss further with Dr Domenic Polite on Monday.

## 2017-02-02 NOTE — Telephone Encounter (Signed)
Daughter Suanne Marker called back and states her dad will go to the ED for rvaulation

## 2017-02-02 NOTE — Telephone Encounter (Signed)
I spoke with Cruzita Lederer, Charge in ED, to tell her patient was en route

## 2017-02-03 ENCOUNTER — Telehealth: Payer: Self-pay | Admitting: Cardiology

## 2017-02-03 ENCOUNTER — Telehealth: Payer: Self-pay

## 2017-02-03 DIAGNOSIS — I10 Essential (primary) hypertension: Secondary | ICD-10-CM | POA: Diagnosis not present

## 2017-02-03 DIAGNOSIS — Z7982 Long term (current) use of aspirin: Secondary | ICD-10-CM | POA: Diagnosis not present

## 2017-02-03 DIAGNOSIS — E039 Hypothyroidism, unspecified: Secondary | ICD-10-CM | POA: Diagnosis not present

## 2017-02-03 DIAGNOSIS — Z7984 Long term (current) use of oral hypoglycemic drugs: Secondary | ICD-10-CM | POA: Diagnosis not present

## 2017-02-03 DIAGNOSIS — M542 Cervicalgia: Secondary | ICD-10-CM | POA: Diagnosis not present

## 2017-02-03 DIAGNOSIS — I2510 Atherosclerotic heart disease of native coronary artery without angina pectoris: Secondary | ICD-10-CM | POA: Diagnosis not present

## 2017-02-03 DIAGNOSIS — Z79891 Long term (current) use of opiate analgesic: Secondary | ICD-10-CM | POA: Diagnosis not present

## 2017-02-03 DIAGNOSIS — G540 Brachial plexus disorders: Secondary | ICD-10-CM | POA: Diagnosis not present

## 2017-02-03 DIAGNOSIS — R338 Other retention of urine: Secondary | ICD-10-CM | POA: Diagnosis not present

## 2017-02-03 DIAGNOSIS — Z466 Encounter for fitting and adjustment of urinary device: Secondary | ICD-10-CM | POA: Diagnosis not present

## 2017-02-03 DIAGNOSIS — E1140 Type 2 diabetes mellitus with diabetic neuropathy, unspecified: Secondary | ICD-10-CM | POA: Diagnosis not present

## 2017-02-03 DIAGNOSIS — R339 Retention of urine, unspecified: Secondary | ICD-10-CM | POA: Diagnosis not present

## 2017-02-03 MED ORDER — METOPROLOL TARTRATE 25 MG PO TABS: 12.5000 mg | ORAL_TABLET | Freq: Two times a day (BID) | ORAL | 3 refills | Status: DC

## 2017-02-03 NOTE — Telephone Encounter (Signed)
Nurse, Otila Kluver made aware. Contacted daughter to inform, LMTCB. Patient has an echo scheduled in [facility] office tomorrow 9/27 we can do ECG at that time.

## 2017-02-03 NOTE — Telephone Encounter (Signed)
Reviewed the recent ECGs. He has had atrial fibrillation/flutter with RVR. Slow heart rate now may reflect the fact that he has gone back into sinus rhythm since we started amiodarone. In this case would reduce Lopressor to 12.5 mg twice daily. If he can get to a nurse visit in [place] for ECG to confirm rhythm that would also be useful.

## 2017-02-03 NOTE — Telephone Encounter (Signed)
LMTCB

## 2017-02-03 NOTE — Telephone Encounter (Signed)
Returning someones call 

## 2017-02-03 NOTE — Telephone Encounter (Signed)
Informed daughter of Dr. Myles Gip recommendation regarding Lopressor and needing an EKG. Daughter also requested Dr. Domenic Polite review lab work that was done in the hospital as well

## 2017-02-03 NOTE — Telephone Encounter (Signed)
[facility] stating [facility] was seen in ER yesterday at [facility]. Discharge instructions to take Lopressor 25 mg in the morning and 12.5 mg in the evening. Nurse stated HR is now 48 and BP 118/58. [facility] did not admit patient yesterday. Nurse is concerned with HR being so low.

## 2017-02-04 ENCOUNTER — Ambulatory Visit (INDEPENDENT_AMBULATORY_CARE_PROVIDER_SITE_OTHER): Payer: Medicare Other | Admitting: *Deleted

## 2017-02-04 ENCOUNTER — Other Ambulatory Visit: Payer: Self-pay

## 2017-02-04 ENCOUNTER — Ambulatory Visit (INDEPENDENT_AMBULATORY_CARE_PROVIDER_SITE_OTHER): Payer: Medicare Other

## 2017-02-04 DIAGNOSIS — I480 Paroxysmal atrial fibrillation: Secondary | ICD-10-CM

## 2017-02-04 DIAGNOSIS — I4891 Unspecified atrial fibrillation: Secondary | ICD-10-CM | POA: Diagnosis not present

## 2017-02-04 DIAGNOSIS — R0602 Shortness of breath: Secondary | ICD-10-CM

## 2017-02-04 NOTE — Telephone Encounter (Signed)
EKG done in office today after Echo.  Dr. Domenic Polite reviewed.  See nurse visit & EKG scanned into EPIC.

## 2017-02-04 NOTE — Progress Notes (Signed)
Patient in office for echo.  EKG done afterwards per SM.  Dr. Domenic Polite reviewed (will be scanned into EPIC).    Advised patient to continue all medications the same for now.  Give the Amiodarone a little more time to work & keep follow up that is already scheduled for for 02-09-17 with Dr. Domenic Polite in the [place] office.

## 2017-02-05 ENCOUNTER — Encounter: Payer: Self-pay | Admitting: Cardiology

## 2017-02-05 ENCOUNTER — Other Ambulatory Visit (INDEPENDENT_AMBULATORY_CARE_PROVIDER_SITE_OTHER): Payer: Medicare Other | Admitting: *Deleted

## 2017-02-05 ENCOUNTER — Telehealth: Payer: Self-pay | Admitting: Cardiology

## 2017-02-05 DIAGNOSIS — I10 Essential (primary) hypertension: Secondary | ICD-10-CM | POA: Diagnosis not present

## 2017-02-05 DIAGNOSIS — Z7982 Long term (current) use of aspirin: Secondary | ICD-10-CM | POA: Diagnosis not present

## 2017-02-05 DIAGNOSIS — I480 Paroxysmal atrial fibrillation: Secondary | ICD-10-CM

## 2017-02-05 DIAGNOSIS — E039 Hypothyroidism, unspecified: Secondary | ICD-10-CM | POA: Diagnosis not present

## 2017-02-05 DIAGNOSIS — R339 Retention of urine, unspecified: Secondary | ICD-10-CM | POA: Diagnosis not present

## 2017-02-05 DIAGNOSIS — M542 Cervicalgia: Secondary | ICD-10-CM | POA: Diagnosis not present

## 2017-02-05 DIAGNOSIS — E1140 Type 2 diabetes mellitus with diabetic neuropathy, unspecified: Secondary | ICD-10-CM | POA: Diagnosis not present

## 2017-02-05 DIAGNOSIS — Z7984 Long term (current) use of oral hypoglycemic drugs: Secondary | ICD-10-CM | POA: Diagnosis not present

## 2017-02-05 DIAGNOSIS — G540 Brachial plexus disorders: Secondary | ICD-10-CM | POA: Diagnosis not present

## 2017-02-05 DIAGNOSIS — Z466 Encounter for fitting and adjustment of urinary device: Secondary | ICD-10-CM | POA: Diagnosis not present

## 2017-02-05 DIAGNOSIS — I2510 Atherosclerotic heart disease of native coronary artery without angina pectoris: Secondary | ICD-10-CM | POA: Diagnosis not present

## 2017-02-05 DIAGNOSIS — Z79891 Long term (current) use of opiate analgesic: Secondary | ICD-10-CM | POA: Diagnosis not present

## 2017-02-05 LAB — URINE CULTURE: Culture: >=100000 — AB

## 2017-02-05 NOTE — Telephone Encounter (Signed)
I spoke with [facility] and she will continue to monitor

## 2017-02-05 NOTE — Telephone Encounter (Signed)
I will forward to Dr Domenic Polite for instructions and then call [facility]

## 2017-02-05 NOTE — Telephone Encounter (Signed)
3 plus edema today--weight on home scale 209.2 and HR going from 50-100 per Verl Dicker w/ [facility] please give her a call @ [phone] Also, please give daughter Suanne Marker a call.

## 2017-02-05 NOTE — Telephone Encounter (Signed)
That weight is less than it was at recent office visit. Heart rate also seems to be better controlled. Unless he is doing worse clinically, it seems that we should continue the present course.

## 2017-02-06 ENCOUNTER — Telehealth: Payer: Self-pay

## 2017-02-06 NOTE — Telephone Encounter (Signed)
Post ED Visit - Positive Culture Follow-up: Successful Patient Follow-Up  Culture assessed and recommendations reviewed by: []  Elenor Quinones, Pharm.D. []  Heide Guile, Pharm.D., BCPS AQ-ID []  Parks Neptune, Pharm.D., BCPS []  Alycia Rossetti, Pharm.D., BCPS []  [place], Pharm.D., BCPS, AAHIVP []  Legrand Como, Pharm.D., BCPS, AAHIVP []  Salome Arnt, PharmD, BCPS []  Dimitri Ped, PharmD, BCPS []  Vincenza Hews, PharmD, BCPS  Positive urine culture  []  Patient discharged without antimicrobial prescription and treatment is now indicated [x]  Organism is resistant to prescribed ED discharge antimicrobial []  Patient with positive blood cultures  Changes discussed with ED provider: Margarita Mail PA-C New antibiotic prescription Bactrim DS 1 BID x 7 days Called to [facility] [place]  Contacted patient, date 02/06/17, time [place], [place]ynn Comment 02/06/2017, 11:08 AM

## 2017-02-06 NOTE — Progress Notes (Signed)
ED Antimicrobial Stewardship Positive Culture Follow Up   Adam Peck is an 83 y.o. male who presented to [facility] on 02/02/2017 with a chief complaint of  Chief Complaint  Patient presents with  . Tachycardia    Recent Results (from the past 720 hour(s))  Urine culture     Status: Abnormal   Collection Time: 02/02/17  5:12 PM  Result Value Ref Range Status   Specimen Description URINE, CLEAN CATCH  Final   Special Requests NONE  Final   Culture >=100,000 COLONIES/mL ENTEROBACTER SPECIES (A)  Final   Report Status 02/05/2017 FINAL  Final   Organism ID, Bacteria ENTEROBACTER SPECIES (A)  Final      Susceptibility   Enterobacter species - MIC*    CEFAZOLIN >=64 RESISTANT Resistant     CEFTRIAXONE >=64 RESISTANT Resistant     CIPROFLOXACIN <=0.25 SENSITIVE Sensitive     GENTAMICIN <=1 SENSITIVE Sensitive     IMIPENEM 0.5 SENSITIVE Sensitive     NITROFURANTOIN 128 RESISTANT Resistant     TRIMETH/SULFA <=20 SENSITIVE Sensitive     PIP/TAZO 64 INTERMEDIATE Intermediate     * >=100,000 COLONIES/mL ENTEROBACTER SPECIES    [x]  Treated with cephalexin, organism resistant to prescribed antimicrobial []  Patient discharged originally without antimicrobial agent and treatment is now indicated  New antibiotic prescription: bactrim ds bid x 1 week  ED Provider: Margarita Mail, PA-C  [name] 02/06/2017, 10:00 AM Infectious Diseases Pharmacist Phone# [phone]

## 2017-02-08 NOTE — Progress Notes (Signed)
Cardiology Office Note  Date: 02/09/2017   ID: Adam Peck, DOB 05/03/1934, MRN [id number]  PCP: Curlene Labrum, MD  Primary Cardiologist: Rozann Lesches, MD   Chief Complaint  Patient presents with  . Cardiac follow-up     History of Present Illness: Adam Peck is an 83 y.o. male seen recently in September with newly documented atrial fibrillation associated with RVR. He was continued on beta blocker with addition of oral amiodarone load, and also continued on aspirin with concerns about initiating anticoagulation at that particular time. Atrial flutter has been documented since that time with continued efforts at managing heart rate as amiodarone is loaded. He presents with 2 family members today for follow-up. Since I saw him last he has been seen in the ER on 2 occasions, one with rapid heart rate, and the other with urinary retention. He has a Foley catheter back in place at this time. The patient's daughter tells me that Mr. Wenke continues to struggle with widely fluctuating heart rates, sometimes as high as the 150s in the mornings, and also recently with significant hypoxia on room air which is new within the last few days. He does not report any cough, mainly states that he feels very weak. He is not aware of any sense of palpitations or chest pain. He has been unsteady but does use a walker, no recent fall.  CHADSVASC score is 6. We have discussed anticoagulation, although I do have concerns about his fall risk. Unfortunately, without anticoagulation cardioversion will not be able to be pursued aggressively, and getting him back in sinus rhythm would likely be helpful in stabilizing his fluid gain with acute on chronic diastolic heart failure.  Recent follow-up echocardiogram showed LVEF approximately 55% with mild mitral regurgitation, mildly reduced right ventricular contraction, and mild tricuspid regurgitation with PASP estimated 39 mmHg. We went over the results  today.  I have expressed my concerns today to the patient and family that we are not making much ground with outpatient management, and I am recommending hospitalization to better clarify treatment strategies.  Past Medical History:  Diagnosis Date  . Anxiety   . Arthritis   . Carpal tunnel syndrome   . Chronic elbow pain, right   . Chronic neck pain   . Coronary atherosclerosis of native coronary artery    CABG 08/2012: LIMA-LAD; SVG-RI; SVG-OM1; SVG-PD; DES to the OM and RCA 2005  . ED (erectile dysfunction)   . Enlarged prostate   . Essential hypertension, benign   . GERD (gastroesophageal reflux disease)   . Headache(784.0)   . History of gout   . Hypothyroidism   . Mixed hyperlipidemia    Intolerant to statins  . Orthostatic hypotension   . Paroxysmal atrial fibrillation (HCC)    Postoperative  . Sinus bradycardia   . Tick bite 06/2012  . Type 2 diabetes mellitus ([place])     Past Surgical History:  Procedure Laterality Date  . CARDIAC CATHETERIZATION    . CARPAL TUNNEL RELEASE Right   . CATARACT EXTRACTION W/PHACO Left 05/21/2014   Procedure: CATARACT EXTRACTION PHACO AND INTRAOCULAR LENS PLACEMENT (IOC);  Surgeon: Tonny Branch, MD;  [facility]: [facility];  Service: Ophthalmology;  Laterality: Left;  CDE:8.18  . CATARACT EXTRACTION W/PHACO Right 06/07/2014   Procedure: CATARACT EXTRACTION PHACO AND INTRAOCULAR LENS PLACEMENT RIGHT;  Surgeon: Tonny Branch, MD;  Location: [facility];  Service: Ophthalmology;  Laterality: Right;  CDE:10.78  . CERVICAL FUSION    . COLONOSCOPY N/A  01/07/2017   Procedure: COLONOSCOPY;  Surgeon: Rogene Houston, MD;  [facility];  Service: Endoscopy;  Laterality: N/A;  1:00-rescheduled 8/30 @ 12:00 per Lelon Frohlich  . CORONARY ARTERY BYPASS GRAFT N/A 08/30/2012   Procedure: CORONARY ARTERY BYPASS GRAFTING (CABG);  Surgeon: Ivin Poot, MD;  Location: [facility];  Service: Open Heart Surgery;  Laterality: N/A;  . HERNIA REPAIR    . INTRAOPERATIVE  TRANSESOPHAGEAL ECHOCARDIOGRAM N/A 08/30/2012   Procedure: INTRAOPERATIVE TRANSESOPHAGEAL ECHOCARDIOGRAM;  Surgeon: Ivin Poot, MD;  Location: [facility];  Service: Open Heart Surgery;  Laterality: N/A;  . L4-5 laminectomy     x 2   . POLYPECTOMY  01/07/2017   Procedure: POLYPECTOMY;  Surgeon: Rogene Houston, MD;  [facility];  Service: Endoscopy;;  colon    Current Outpatient Prescriptions  Medication Sig Dispense Refill  . amiodarone (PACERONE) 200 MG tablet Take 1 tablet (200 mg total) by mouth 2 (two) times daily. 180 tablet 3  . amLODipine (NORVASC) 5 MG tablet Take 5 mg by mouth daily.    Marland Kitchen aspirin EC 81 MG tablet Take 324 mg by mouth daily.     . Aspirin-Salicylamide-Caffeine (BC FAST PAIN RELIEF) 650-195-33.3 MG PACK Take 1 packet by mouth daily as needed (for pain.).    Marland Kitchen dexamethasone (DECADRON) 4 MG tablet Take 6 mg by mouth 3 (three) times daily. 28 day course starting on 01/15/2017    . levothyroxine (SYNTHROID, LEVOTHROID) 75 MCG tablet Take 75 mcg by mouth daily before breakfast.     . losartan (COZAAR) 25 MG tablet Take 25 mg by mouth daily.    Marland Kitchen lubiprostone (AMITIZA) 24 MCG capsule Take 1 capsule (24 mcg total) by mouth 2 (two) times daily with a meal. 60 capsule 5  . metFORMIN (GLUCOPHAGE) 500 MG tablet Take 500 mg by mouth 2 (two) times daily with a meal.    . metoprolol tartrate (LOPRESSOR) 25 MG tablet Take 0.5 tablets (12.5 mg total) by mouth 2 (two) times daily. 180 tablet 3  . NITROSTAT 0.4 MG SL tablet Take 0.4 tablets by mouth every 5 (five) minutes x 3 doses as needed for chest pain.     Marland Kitchen risperiDONE (RISPERDAL) 1 MG tablet Take 1 mg by mouth at bedtime.     . Tamsulosin HCl (FLOMAX) 0.4 MG CAPS Take 0.4 mg by mouth at bedtime.      No current facility-administered medications for this visit.    Allergies:  Ezetimibe and Statins   Social History: The patient  reports that he quit smoking about 48 years ago. His smoking use included Cigarettes. He has  a 140.00 pack-year smoking history. He quit smokeless tobacco use about 38 years ago. His smokeless tobacco use included Chew. He reports that he does not drink alcohol or use drugs.   Family History: The patient's family history is not on file.   ROS:  Please see the history of present illness. Otherwise, complete review of systems is positive for weakness, chronic shortness of breath, unsteadiness.  All other systems are reviewed and negative.   Physical Exam: VS:  BP 110/60   Pulse 66   Ht 5' 10.5" (1.791 m)   Wt 200 lb 3.2 oz (90.8 kg)   SpO2 95%   BMI 28.32 kg/m , BMI Body mass index is 28.32 kg/m.  Wt Readings from Last 3 Encounters:  02/09/17 200 lb 3.2 oz (90.8 kg)  02/09/17 199 lb (90.3 kg)  02/02/17 211 lb (95.7  kg)    General: Elderly male, no acute distress. Using a walker. HEENT: Conjunctiva and lids normal, oropharynx clear. Neck: Supple, elevated JVP, no carotid bruits, no thyromegaly. Lungs: Decreased breath sounds at the bases, nonlabored breathing at rest. Cardiac: Irregularly irregular, no S3, soft systolic murmur, no pericardial rub. Abdomen: Soft, nontender, bowel sounds present, no guarding or rebound. Extremities: Chronic appearing, progressive leg edema, distal pulses 1-2+. Skin: Warm and dry. Musculoskeletal: No kyphosis. Neuropsychiatric: Alert and oriented x3, affect grossly appropriate.  ECG: I personally reviewed the tracing from 02/04/2017 which showed typical atrial flutter with variable conduction and nonspecific ST-T changes.  Recent Labwork: 02/02/2017: B Natriuretic Peptide 183.0; BUN 29; Creatinine, Ser 1.32; Hemoglobin 13.8; Platelets 153; Potassium 4.5; Sodium 129   Other Studies Reviewed Today:  Echocardiogram 02/04/2017: Study Conclusions  - Left ventricle: The cavity size was normal. Wall thickness was   increased in a pattern of mild LVH. The estimated ejection   fraction was 55%. The study is not technically sufficient to   allow  evaluation of LV diastolic function. - Aortic valve: Mildly calcified annulus. Trileaflet; mildly   calcified leaflets. - Mitral valve: There was mild regurgitation. - Left atrium: The atrium was at the upper limits of normal in   size. - Right ventricle: Systolic function was mildly reduced. - Right atrium: Central venous pressure (est): 3 mm Hg. - Tricuspid valve: There was mild regurgitation. - Pulmonary arteries: PA peak pressure: 39 mm Hg (S). - Pericardium, extracardiac: There was no pericardial effusion.  Impressions:  - Mild LVH with LVEF approximately 55% based on imaging in the   latter portions of the study when heart rate had decreased.   Diastolic function is indeterminate in the setting of atrial   flutter. Upper normal left atrial chamber size. Mild mitral   regurgitation. Mildly sclerotic aortic valve. Mildly reduced   right ventricular contraction. Mild tricuspid regurgitation with   PASP estimated 39 mmHg.  Assessment and Plan:  Medically complex patient with recent issues that are difficult to manage as an outpatient. Hospitalization is being recommended at this time for further workup.  1. Atrial fibrillation/flutter with variable heart rate control on Lopressor and oral amiodarone load which was initiated on September 20. CHADSVASC score is 6. He has been on aspirin up to this point. Patient is being admitted to the hospitalist service with cardiology to follow in consultation. My recommendation would be to initiate oral anticoagulation with a plan to ultimately pursue cardioversion, particularly if he does not convert on amiodarone. Based on discussion with patient's daughter, heart rate continues to be widely variable at home, although was well controlled on today's visit. Depending on degree of renal insufficiency, he may need Coumadin instead of DOAC, follow-up lab work is needed. I would suggest doing a TEE cardioversion during his current hospital stay if other  issues have been worked up and stabilized as noted below.  2. Recurrent hypoxia based on discussion with daughter. Patient has home health nursing that has also confirmed hypoxia at home. Etiology is not certain at this time. LVEF normal by recent assessment with suspected diastolic heart failure, but doubt that this is the singular explanation. Chest x-ray from September 25 showed no acute process. He likely needs to be worked up for pulmonary embolus. Would also check ambulatory oxygen saturation.  3. Acute on chronic diastolic heart failure complicated by atrial arrhythmia and urinary retention. Will likely need IV diuretics during hospital stay, adjusted based on urine output and degree  of renal insufficiency  4. CAD status post CABG, no active angina symptoms at this time.  5. Essential hypertension, blood pressure is currently controlled.  6. Hyperlipidemia with statin intolerance.  7. Recurring urinary retention, currently has Foley catheter in place. Likely need further input from Urology service.  Current medicines were reviewed with the patient today.  Disposition: Patient being admitted to the [facility] hospitalist team with cardiology following in consultation.  Signed, Satira Sark, MD, [facility] 02/09/2017 10:34 AM    [facility]. [address] [place], [place] [address] Phone: [phone]; Fax: [phone]

## 2017-02-09 ENCOUNTER — Inpatient Hospital Stay (HOSPITAL_COMMUNITY): Payer: Medicare Other

## 2017-02-09 ENCOUNTER — Encounter: Payer: Self-pay | Admitting: Cardiology

## 2017-02-09 ENCOUNTER — Encounter (HOSPITAL_COMMUNITY): Payer: Self-pay | Admitting: Family Medicine

## 2017-02-09 ENCOUNTER — Ambulatory Visit (INDEPENDENT_AMBULATORY_CARE_PROVIDER_SITE_OTHER): Payer: Medicare Other | Admitting: Cardiology

## 2017-02-09 ENCOUNTER — Inpatient Hospital Stay (HOSPITAL_COMMUNITY)
Admission: AD | Admit: 2017-02-09 | Discharge: 2017-02-13 | DRG: 175 | Disposition: A | Discharge status: Home Health | Payer: Medicare Other | Source: Ambulatory Visit | Attending: Family Medicine | Admitting: Family Medicine

## 2017-02-09 ENCOUNTER — Ambulatory Visit (INDEPENDENT_AMBULATORY_CARE_PROVIDER_SITE_OTHER): Payer: Medicare Other | Admitting: Internal Medicine

## 2017-02-09 ENCOUNTER — Encounter (INDEPENDENT_AMBULATORY_CARE_PROVIDER_SITE_OTHER): Payer: Self-pay | Admitting: Internal Medicine

## 2017-02-09 VITALS — BP 110/60 | HR 66 | Ht 70.5 in | Wt 200.2 lb

## 2017-02-09 VITALS — BP 122/70 | HR 72 | Temp 98.3°F | Ht 70.5 in | Wt 199.0 lb

## 2017-02-09 DIAGNOSIS — K5900 Constipation, unspecified: Secondary | ICD-10-CM | POA: Diagnosis not present

## 2017-02-09 DIAGNOSIS — F419 Anxiety disorder, unspecified: Secondary | ICD-10-CM | POA: Diagnosis present

## 2017-02-09 DIAGNOSIS — E1122 Type 2 diabetes mellitus with diabetic chronic kidney disease: Secondary | ICD-10-CM | POA: Diagnosis not present

## 2017-02-09 DIAGNOSIS — I4891 Unspecified atrial fibrillation: Secondary | ICD-10-CM

## 2017-02-09 DIAGNOSIS — Z87891 Personal history of nicotine dependence: Secondary | ICD-10-CM

## 2017-02-09 DIAGNOSIS — R338 Other retention of urine: Secondary | ICD-10-CM | POA: Diagnosis present

## 2017-02-09 DIAGNOSIS — B9689 Other specified bacterial agents as the cause of diseases classified elsewhere: Secondary | ICD-10-CM | POA: Diagnosis not present

## 2017-02-09 DIAGNOSIS — I4892 Unspecified atrial flutter: Secondary | ICD-10-CM | POA: Diagnosis not present

## 2017-02-09 DIAGNOSIS — R627 Adult failure to thrive: Secondary | ICD-10-CM | POA: Diagnosis not present

## 2017-02-09 DIAGNOSIS — E039 Hypothyroidism, unspecified: Secondary | ICD-10-CM | POA: Diagnosis present

## 2017-02-09 DIAGNOSIS — Z888 Allergy status to other drugs, medicaments and biological substances status: Secondary | ICD-10-CM

## 2017-02-09 DIAGNOSIS — N182 Chronic kidney disease, stage 2 (mild): Secondary | ICD-10-CM | POA: Diagnosis not present

## 2017-02-09 DIAGNOSIS — E1121 Type 2 diabetes mellitus with diabetic nephropathy: Secondary | ICD-10-CM | POA: Diagnosis not present

## 2017-02-09 DIAGNOSIS — I480 Paroxysmal atrial fibrillation: Secondary | ICD-10-CM | POA: Diagnosis not present

## 2017-02-09 DIAGNOSIS — I2510 Atherosclerotic heart disease of native coronary artery without angina pectoris: Secondary | ICD-10-CM | POA: Diagnosis not present

## 2017-02-09 DIAGNOSIS — R0600 Dyspnea, unspecified: Secondary | ICD-10-CM | POA: Diagnosis not present

## 2017-02-09 DIAGNOSIS — K219 Gastro-esophageal reflux disease without esophagitis: Secondary | ICD-10-CM | POA: Diagnosis not present

## 2017-02-09 DIAGNOSIS — I25119 Atherosclerotic heart disease of native coronary artery with unspecified angina pectoris: Secondary | ICD-10-CM

## 2017-02-09 DIAGNOSIS — I959 Hypotension, unspecified: Secondary | ICD-10-CM | POA: Diagnosis not present

## 2017-02-09 DIAGNOSIS — N401 Enlarged prostate with lower urinary tract symptoms: Secondary | ICD-10-CM | POA: Diagnosis present

## 2017-02-09 DIAGNOSIS — I2699 Other pulmonary embolism without acute cor pulmonale: Principal | ICD-10-CM | POA: Diagnosis present

## 2017-02-09 DIAGNOSIS — E785 Hyperlipidemia, unspecified: Secondary | ICD-10-CM | POA: Diagnosis not present

## 2017-02-09 DIAGNOSIS — Z8739 Personal history of other diseases of the musculoskeletal system and connective tissue: Secondary | ICD-10-CM

## 2017-02-09 DIAGNOSIS — I130 Hypertensive heart and chronic kidney disease with heart failure and stage 1 through stage 4 chronic kidney disease, or unspecified chronic kidney disease: Secondary | ICD-10-CM | POA: Diagnosis present

## 2017-02-09 DIAGNOSIS — I10 Essential (primary) hypertension: Secondary | ICD-10-CM

## 2017-02-09 DIAGNOSIS — E782 Mixed hyperlipidemia: Secondary | ICD-10-CM | POA: Diagnosis not present

## 2017-02-09 DIAGNOSIS — E1165 Type 2 diabetes mellitus with hyperglycemia: Secondary | ICD-10-CM | POA: Diagnosis not present

## 2017-02-09 DIAGNOSIS — G8929 Other chronic pain: Secondary | ICD-10-CM | POA: Diagnosis present

## 2017-02-09 DIAGNOSIS — N390 Urinary tract infection, site not specified: Secondary | ICD-10-CM | POA: Diagnosis not present

## 2017-02-09 DIAGNOSIS — R0602 Shortness of breath: Secondary | ICD-10-CM | POA: Diagnosis not present

## 2017-02-09 DIAGNOSIS — Z951 Presence of aortocoronary bypass graft: Secondary | ICD-10-CM

## 2017-02-09 DIAGNOSIS — R339 Retention of urine, unspecified: Secondary | ICD-10-CM | POA: Diagnosis not present

## 2017-02-09 DIAGNOSIS — R0902 Hypoxemia: Secondary | ICD-10-CM | POA: Diagnosis present

## 2017-02-09 DIAGNOSIS — Z79899 Other long term (current) drug therapy: Secondary | ICD-10-CM

## 2017-02-09 DIAGNOSIS — R7989 Other specified abnormal findings of blood chemistry: Secondary | ICD-10-CM | POA: Diagnosis not present

## 2017-02-09 DIAGNOSIS — Z7984 Long term (current) use of oral hypoglycemic drugs: Secondary | ICD-10-CM

## 2017-02-09 DIAGNOSIS — I2782 Chronic pulmonary embolism: Secondary | ICD-10-CM | POA: Diagnosis not present

## 2017-02-09 DIAGNOSIS — N183 Chronic kidney disease, stage 3 (moderate): Secondary | ICD-10-CM | POA: Diagnosis present

## 2017-02-09 DIAGNOSIS — I481 Persistent atrial fibrillation: Secondary | ICD-10-CM | POA: Diagnosis not present

## 2017-02-09 DIAGNOSIS — K5909 Other constipation: Secondary | ICD-10-CM | POA: Diagnosis not present

## 2017-02-09 DIAGNOSIS — Z7983 Long term (current) use of bisphosphonates: Secondary | ICD-10-CM

## 2017-02-09 DIAGNOSIS — Z7982 Long term (current) use of aspirin: Secondary | ICD-10-CM

## 2017-02-09 DIAGNOSIS — I25708 Atherosclerosis of coronary artery bypass graft(s), unspecified, with other forms of angina pectoris: Secondary | ICD-10-CM | POA: Diagnosis not present

## 2017-02-09 DIAGNOSIS — I25118 Atherosclerotic heart disease of native coronary artery with other forms of angina pectoris: Secondary | ICD-10-CM | POA: Diagnosis not present

## 2017-02-09 DIAGNOSIS — I5033 Acute on chronic diastolic (congestive) heart failure: Secondary | ICD-10-CM | POA: Diagnosis not present

## 2017-02-09 DIAGNOSIS — I2609 Other pulmonary embolism with acute cor pulmonale: Secondary | ICD-10-CM | POA: Diagnosis not present

## 2017-02-09 DIAGNOSIS — E119 Type 2 diabetes mellitus without complications: Secondary | ICD-10-CM

## 2017-02-09 DIAGNOSIS — Z961 Presence of intraocular lens: Secondary | ICD-10-CM | POA: Diagnosis present

## 2017-02-09 DIAGNOSIS — M109 Gout, unspecified: Secondary | ICD-10-CM | POA: Diagnosis present

## 2017-02-09 DIAGNOSIS — R531 Weakness: Secondary | ICD-10-CM | POA: Diagnosis not present

## 2017-02-09 DIAGNOSIS — N179 Acute kidney failure, unspecified: Secondary | ICD-10-CM | POA: Diagnosis present

## 2017-02-09 DIAGNOSIS — R600 Localized edema: Secondary | ICD-10-CM | POA: Diagnosis not present

## 2017-02-09 DIAGNOSIS — I4819 Other persistent atrial fibrillation: Secondary | ICD-10-CM

## 2017-02-09 LAB — COMPREHENSIVE METABOLIC PANEL
ALBUMIN: 2.8 g/dL — AB (ref 3.5–5.0)
ALK PHOS: 45 U/L (ref 38–126)
ALT: 48 U/L (ref 17–63)
AST: 15 U/L (ref 15–41)
Anion gap: 9 (ref 5–15)
BILIRUBIN TOTAL: 0.7 mg/dL (ref 0.3–1.2)
BUN: 28 mg/dL — AB (ref 6–20)
CALCIUM: 8.6 mg/dL — AB (ref 8.9–10.3)
CO2: 28 mmol/L (ref 22–32)
CREATININE: 1.42 mg/dL — AB (ref 0.61–1.24)
Chloride: 96 mmol/L — ABNORMAL LOW (ref 101–111)
GFR calc Af Amer: 52 mL/min — ABNORMAL LOW (ref >60)
GFR, EST NON AFRICAN AMERICAN: 45 mL/min — AB (ref >60)
GLUCOSE: 188 mg/dL — AB (ref 65–99)
POTASSIUM: 4.3 mmol/L (ref 3.5–5.1)
Sodium: 133 mmol/L — ABNORMAL LOW (ref 135–145)
TOTAL PROTEIN: 5.8 g/dL — AB (ref 6.5–8.1)

## 2017-02-09 LAB — GLUCOSE, CAPILLARY
Glucose-Capillary: 213 mg/dL — ABNORMAL HIGH (ref 65–99)
Glucose-Capillary: 201 mg/dL — ABNORMAL HIGH (ref 65–99)

## 2017-02-09 LAB — CK: Total CK: 52 U/L (ref 49–397)

## 2017-02-09 LAB — APTT: APTT: 30 s (ref 24–36)

## 2017-02-09 LAB — CBC WITH DIFFERENTIAL/PLATELET
BASOS ABS: 0 10*3/uL (ref 0.0–0.1)
BASOS PCT: 0 %
Eosinophils Absolute: 0.3 10*3/uL (ref 0.0–0.7)
Eosinophils Relative: 2 %
HEMATOCRIT: 40.3 % (ref 39.0–52.0)
HEMOGLOBIN: 13.3 g/dL (ref 13.0–17.0)
LYMPHS PCT: 5 %
Lymphs Abs: 0.6 10*3/uL — ABNORMAL LOW (ref 0.7–4.0)
MCH: 28.7 pg (ref 26.0–34.0)
MCHC: 33 g/dL (ref 30.0–36.0)
MCV: 86.9 fL (ref 78.0–100.0)
MONO ABS: 0.8 10*3/uL (ref 0.1–1.0)
Monocytes Relative: 5 %
NEUTROS ABS: 12.3 10*3/uL — AB (ref 1.7–7.7)
NEUTROS PCT: 88 %
Platelets: 172 10*3/uL (ref 150–400)
RBC: 4.64 MIL/uL (ref 4.22–5.81)
RDW: 14.6 % (ref 11.5–15.5)
WBC: 14 10*3/uL — ABNORMAL HIGH (ref 4.0–10.5)

## 2017-02-09 LAB — HEMOGLOBIN A1C
Hgb A1c MFr Bld: 8.7 % — ABNORMAL HIGH (ref 4.8–5.6)
Mean Plasma Glucose: 202.99 mg/dL

## 2017-02-09 LAB — MAGNESIUM: MAGNESIUM: 1.7 mg/dL (ref 1.7–2.4)

## 2017-02-09 LAB — TSH: TSH: 2.799 u[IU]/mL (ref 0.350–4.500)

## 2017-02-09 LAB — BRAIN NATRIURETIC PEPTIDE: B NATRIURETIC PEPTIDE 5: 78 pg/mL (ref 0.0–100.0)

## 2017-02-09 LAB — PROTIME-INR
INR: 1.02
PROTHROMBIN TIME: 13.3 s (ref 11.4–15.2)

## 2017-02-09 LAB — HEPARIN LEVEL (UNFRACTIONATED): HEPARIN UNFRACTIONATED: 1.04 [IU]/mL — AB (ref 0.30–0.70)

## 2017-02-09 LAB — D-DIMER, QUANTITATIVE (NOT AT ARMC): D DIMER QUANT: 5.87 ug{FEU}/mL — AB (ref 0.00–0.50)

## 2017-02-09 MED ORDER — METOPROLOL TARTRATE 25 MG PO TABS
12.5000 mg | ORAL_TABLET | Freq: Two times a day (BID) | ORAL | Status: DC
  Administered 2017-02-09: 12.5 mg via ORAL
  Filled 2017-02-09: qty 1

## 2017-02-09 MED ORDER — LUBIPROSTONE 24 MCG PO CAPS
24.0000 ug | ORAL_CAPSULE | Freq: Two times a day (BID) | ORAL | Status: DC
  Filled 2017-02-09: qty 1

## 2017-02-09 MED ORDER — SODIUM CHLORIDE 0.9% FLUSH: 3.0000 mL | INTRAVENOUS | Status: DC | PRN

## 2017-02-09 MED ORDER — POTASSIUM CHLORIDE CRYS ER 20 MEQ PO TBCR
40.0000 meq | EXTENDED_RELEASE_TABLET | Freq: Two times a day (BID) | ORAL | Status: DC
  Administered 2017-02-09 – 2017-02-13 (×9): 40 meq via ORAL
  Filled 2017-02-09 (×9): qty 2

## 2017-02-09 MED ORDER — TECHNETIUM TC 99M DIETHYLENETRIAME-PENTAACETIC ACID
30.0000 | Freq: Once | INTRAVENOUS | Status: AC | PRN
  Administered 2017-02-09: 33 via RESPIRATORY_TRACT

## 2017-02-09 MED ORDER — AMIODARONE HCL 200 MG PO TABS
200.0000 mg | ORAL_TABLET | Freq: Two times a day (BID) | ORAL | Status: DC
  Administered 2017-02-09 – 2017-02-13 (×8): 200 mg via ORAL
  Filled 2017-02-09 (×8): qty 1

## 2017-02-09 MED ORDER — TAMSULOSIN HCL 0.4 MG PO CAPS
0.4000 mg | ORAL_CAPSULE | Freq: Every day | ORAL | Status: DC
  Administered 2017-02-09 – 2017-02-12 (×4): 0.4 mg via ORAL
  Filled 2017-02-09 (×4): qty 1

## 2017-02-09 MED ORDER — HEPARIN SODIUM (PORCINE) 5000 UNIT/ML IJ SOLN: 5000.0000 [IU] | Freq: Three times a day (TID) | INTRAMUSCULAR | Status: DC

## 2017-02-09 MED ORDER — PANTOPRAZOLE SODIUM 40 MG PO TBEC
40.0000 mg | DELAYED_RELEASE_TABLET | Freq: Every day | ORAL | Status: DC
  Administered 2017-02-10 – 2017-02-13 (×4): 40 mg via ORAL
  Filled 2017-02-09 (×4): qty 1

## 2017-02-09 MED ORDER — RISPERIDONE 1 MG PO TABS
1.0000 mg | ORAL_TABLET | Freq: Every day | ORAL | Status: DC
  Administered 2017-02-09 – 2017-02-12 (×4): 1 mg via ORAL
  Filled 2017-02-09 (×4): qty 1

## 2017-02-09 MED ORDER — WARFARIN - PHARMACIST DOSING INPATIENT: Freq: Every day | Status: DC

## 2017-02-09 MED ORDER — WARFARIN SODIUM 5 MG PO TABS
5.0000 mg | ORAL_TABLET | Freq: Once | ORAL | Status: AC
  Administered 2017-02-09: 5 mg via ORAL
  Filled 2017-02-09: qty 1

## 2017-02-09 MED ORDER — ASPIRIN EC 81 MG PO TBEC
324.0000 mg | DELAYED_RELEASE_TABLET | Freq: Every day | ORAL | Status: DC
  Filled 2017-02-09: qty 4

## 2017-02-09 MED ORDER — HEPARIN BOLUS VIA INFUSION
4000.0000 [IU] | Freq: Once | INTRAVENOUS | Status: AC
  Administered 2017-02-09: 4000 [IU] via INTRAVENOUS
  Filled 2017-02-09: qty 4000

## 2017-02-09 MED ORDER — ONDANSETRON HCL 4 MG/2ML IJ SOLN: 4.0000 mg | Freq: Four times a day (QID) | INTRAMUSCULAR | Status: DC | PRN

## 2017-02-09 MED ORDER — INSULIN ASPART 100 UNIT/ML ~~LOC~~ SOLN
0.0000 [IU] | Freq: Three times a day (TID) | SUBCUTANEOUS | Status: DC
  Administered 2017-02-09 – 2017-02-10 (×4): 3 [IU] via SUBCUTANEOUS
  Administered 2017-02-11: 2 [IU] via SUBCUTANEOUS
  Administered 2017-02-11: 3 [IU] via SUBCUTANEOUS
  Administered 2017-02-11: 7 [IU] via SUBCUTANEOUS
  Administered 2017-02-12: 1 [IU] via SUBCUTANEOUS
  Administered 2017-02-12: 5 [IU] via SUBCUTANEOUS
  Administered 2017-02-12 – 2017-02-13 (×2): 2 [IU] via SUBCUTANEOUS

## 2017-02-09 MED ORDER — ACETAMINOPHEN 325 MG PO TABS: 650.0000 mg | ORAL_TABLET | ORAL | Status: DC | PRN

## 2017-02-09 MED ORDER — LOSARTAN POTASSIUM 50 MG PO TABS
25.0000 mg | ORAL_TABLET | Freq: Every day | ORAL | Status: DC
  Administered 2017-02-10: 25 mg via ORAL
  Filled 2017-02-09: qty 1

## 2017-02-09 MED ORDER — LUBIPROSTONE 24 MCG PO CAPS
24.0000 ug | ORAL_CAPSULE | ORAL | Status: DC
  Administered 2017-02-11 – 2017-02-13 (×2): 24 ug via ORAL
  Filled 2017-02-09 (×2): qty 1

## 2017-02-09 MED ORDER — FUROSEMIDE 10 MG/ML IJ SOLN
40.0000 mg | Freq: Two times a day (BID) | INTRAMUSCULAR | Status: DC
  Administered 2017-02-09 (×2): 40 mg via INTRAVENOUS
  Filled 2017-02-09 (×3): qty 4

## 2017-02-09 MED ORDER — SODIUM CHLORIDE 0.9 % IV SOLN: 250.0000 mL | INTRAVENOUS | Status: DC | PRN

## 2017-02-09 MED ORDER — LEVOTHYROXINE SODIUM 75 MCG PO TABS
75.0000 ug | ORAL_TABLET | Freq: Every day | ORAL | Status: DC
  Administered 2017-02-10 – 2017-02-13 (×4): 75 ug via ORAL
  Filled 2017-02-09 (×4): qty 1

## 2017-02-09 MED ORDER — SODIUM CHLORIDE 0.9% FLUSH
3.0000 mL | Freq: Two times a day (BID) | INTRAVENOUS | Status: DC
  Administered 2017-02-10 – 2017-02-13 (×6): 3 mL via INTRAVENOUS

## 2017-02-09 MED ORDER — HEPARIN (PORCINE) IN NACL 100-0.45 UNIT/ML-% IJ SOLN
1050.0000 [IU]/h | INTRAMUSCULAR | Status: DC
  Administered 2017-02-09 – 2017-02-10 (×2): 1400 [IU]/h via INTRAVENOUS
  Administered 2017-02-10: 1200 [IU]/h via INTRAVENOUS
  Filled 2017-02-09 (×2): qty 250

## 2017-02-09 MED ORDER — AMLODIPINE BESYLATE 5 MG PO TABS
5.0000 mg | ORAL_TABLET | Freq: Every day | ORAL | Status: DC
  Administered 2017-02-10: 5 mg via ORAL
  Filled 2017-02-09: qty 1

## 2017-02-09 MED ORDER — CIPROFLOXACIN HCL 250 MG PO TABS
500.0000 mg | ORAL_TABLET | Freq: Two times a day (BID) | ORAL | Status: DC
  Administered 2017-02-09 – 2017-02-13 (×9): 500 mg via ORAL
  Filled 2017-02-09 (×10): qty 2

## 2017-02-09 MED ORDER — TECHNETIUM TO 99M ALBUMIN AGGREGATED
4.0000 | Freq: Once | INTRAVENOUS | Status: AC | PRN
  Administered 2017-02-09: 3.8 via INTRAVENOUS

## 2017-02-09 NOTE — Patient Instructions (Signed)
Continue the Amitiza. OV as needed

## 2017-02-09 NOTE — H&P (Signed)
History and Physical  Adam Peck HBZ:[id number] DOB: March 06, 1934 DOA: 02/09/2017  Referring physician: Domenic Polite PCP: Curlene Labrum, MD  Cardiologist: Dr. Domenic Polite GI: Deberah Castle NP  Chief Complaint: weakness   HPI: Adam Peck is a 83 y.o. male with history of paroxysmal atrial fibrillation, congestive heart failure, coronary artery disease and other medical history detailed below presented as a direct admission to the hospital from his cardiologist's office today. The patient has been having progressive symptoms of weakness and shortness of breath over the past several days. His atrial fibrillation has been difficult to control. He has been managed outpatient with oral medications but continues to have fluctuating episodes of tachycardia and bradycardia. The patient presented today with edema in the legs and some acute diastolic heart failure. Dr. Domenic Polite was concerned about a PE given the patient's symptoms of shortness of breath and hypoxia. He also has been suffering with urinary retention and has a chronic indwelling Foley catheter. He was recently treated for an Enterobacter UTI and started medication for that yesterday. He is being admitted to the hospital for difficult to control atrial fibrillation, hypoxia, acute on chronic diastolic heart failure and progressive generalized weakness.  Review of Systems: All systems reviewed and apart from history of presenting illness, are negative.  Past Medical History:  Diagnosis Date  . Anxiety   . Arthritis   . Carpal tunnel syndrome   . Chronic elbow pain, right   . Chronic neck pain   . Coronary atherosclerosis of native coronary artery    CABG 08/2012: LIMA-LAD; SVG-RI; SVG-OM1; SVG-PD; DES to the OM and RCA 2005  . ED (erectile dysfunction)   . Enlarged prostate   . Essential hypertension, benign   . GERD (gastroesophageal reflux disease)   . Headache(784.0)   . History of gout   . Hypothyroidism   . Mixed  hyperlipidemia    Intolerant to statins  . Orthostatic hypotension   . Paroxysmal atrial fibrillation (HCC)    Postoperative  . Sinus bradycardia   . Tick bite 06/2012  . Type 2 diabetes mellitus ([place])    Past Surgical History:  Procedure Laterality Date  . CARDIAC CATHETERIZATION    . CARPAL TUNNEL RELEASE Right   . CATARACT EXTRACTION W/PHACO Left 05/21/2014   Procedure: CATARACT EXTRACTION PHACO AND INTRAOCULAR LENS PLACEMENT (IOC);  Surgeon: Tonny Branch, MD;  [facility];  Service: Ophthalmology;  Laterality: Left;  CDE:8.18  . CATARACT EXTRACTION W/PHACO Right 06/07/2014   Procedure: CATARACT EXTRACTION PHACO AND INTRAOCULAR LENS PLACEMENT RIGHT;  Surgeon: Tonny Branch, MD;  [facility];  Service: Ophthalmology;  Laterality: Right;  CDE:10.78  . CERVICAL FUSION    . COLONOSCOPY N/A 01/07/2017   Procedure: COLONOSCOPY;  Surgeon: Rogene Houston, MD;  [facility];  Service: Endoscopy;  Laterality: N/A;  1:00-rescheduled 8/30 @ 12:00 per Lelon Frohlich  . CORONARY ARTERY BYPASS GRAFT N/A 08/30/2012   Procedure: CORONARY ARTERY BYPASS GRAFTING (CABG);  Surgeon: Ivin Poot, MD;  Location: [facility];  Service: Open Heart Surgery;  Laterality: N/A;  . HERNIA REPAIR    . INTRAOPERATIVE TRANSESOPHAGEAL ECHOCARDIOGRAM N/A 08/30/2012   Procedure: INTRAOPERATIVE TRANSESOPHAGEAL ECHOCARDIOGRAM;  Surgeon: Ivin Poot, MD;  Location: [place];  Service: Open Heart Surgery;  Laterality: N/A;  . L4-5 laminectomy     x 2   . POLYPECTOMY  01/07/2017   Procedure: POLYPECTOMY;  Surgeon: Rogene Houston, MD;  [facility];  Service: Endoscopy;;  colon  Social History:  reports that he quit smoking about 48 years ago. His smoking use included Cigarettes. He has a 140.00 pack-year smoking history. He quit smokeless tobacco use about 38 years ago. His smokeless tobacco use included Chew. He reports that he does not drink alcohol or use drugs.  Allergies  Allergen Reactions  . Ezetimibe  Other (See Comments)    Reaction:muscle cramps  . Statins Other (See Comments)    Reaction:muscle cramps    Family History  Problem Relation Age of Onset  . Colon cancer Neg Hx     Prior to Admission medications   Medication Sig Start Date End Date Taking? Authorizing Provider  amiodarone (PACERONE) 200 MG tablet Take 1 tablet (200 mg total) by mouth 2 (two) times daily. 01/28/17   Satira Sark, MD  amLODipine (NORVASC) 5 MG tablet Take 5 mg by mouth daily.    [provider]  aspirin EC 81 MG tablet Take 324 mg by mouth daily.     [provider]  Aspirin-Salicylamide-Caffeine (BC FAST PAIN RELIEF) 650-195-33.3 MG PACK Take 1 packet by mouth daily as needed (for pain.).    [provider]  dexamethasone (DECADRON) 4 MG tablet Take 6 mg by mouth 3 (three) times daily. 28 day course starting on 01/15/2017 01/15/17   [provider]  levothyroxine (SYNTHROID, LEVOTHROID) 75 MCG tablet Take 75 mcg by mouth daily before breakfast.     [provider]  losartan (COZAAR) 25 MG tablet Take 25 mg by mouth daily.    [provider]  lubiprostone (AMITIZA) 24 MCG capsule Take 1 capsule (24 mcg total) by mouth 2 (two) times daily with a meal. 01/07/17   Rehman, Mechele Dawley, MD  metFORMIN (GLUCOPHAGE) 500 MG tablet Take 500 mg by mouth 2 (two) times daily with a meal.    [provider]  metoprolol tartrate (LOPRESSOR) 25 MG tablet Take 0.5 tablets (12.5 mg total) by mouth 2 (two) times daily. 02/03/17 05/04/17  Satira Sark, MD  NITROSTAT 0.4 MG SL tablet Take 0.4 tablets by mouth every 5 (five) minutes x 3 doses as needed for chest pain.  08/13/12   [provider]  risperiDONE (RISPERDAL) 1 MG tablet Take 1 mg by mouth at bedtime.  01/25/17   [provider]  Tamsulosin HCl (FLOMAX) 0.4 MG CAPS Take 0.4 mg by mouth at bedtime.     [provider]   Physical Exam: There were no vitals filed for this  visit.   General exam: Moderately built and nourished patient, lying comfortably supine on the gurney in no obvious distress.  Head, eyes and ENT: Nontraumatic and normocephalic. Pupils equally reacting to light and accommodation. Oral mucosa moist.  Neck: Supple. No JVD, carotid bruit or thyromegaly.  Lymphatics: No lymphadenopathy.  Respiratory system: Clear to auscultation. No increased work of breathing.  Cardiovascular system: S1 and S2 heard, RRR. No JVD, murmurs, gallops, clicks or pedal edema.  Gastrointestinal system: Abdomen is nondistended, soft and nontender. Normal bowel sounds heard. No organomegaly or masses appreciated.  Central nervous system: Alert and oriented. No focal neurological deficits.  Extremities: Symmetric 5 x 5 power. Peripheral pulses symmetrically felt.   Skin: No rashes or acute findings.  Musculoskeletal system: Negative exam.  Psychiatry: Pleasant and cooperative.  Labs on Admission:  Basic Metabolic Panel:  Recent Labs Lab 02/02/17 1600  NA 129*  K 4.5  CL 96*  CO2 23  GLUCOSE 407*  BUN 29*  CREATININE 1.32*  CALCIUM 8.3*   Liver Function Tests: No results for input(s): AST, ALT, ALKPHOS, BILITOT, PROT, ALBUMIN in the last 168 hours. No results for input(s): LIPASE, AMYLASE in the last 168 hours. No results for input(s): AMMONIA in the last 168 hours. CBC:  Recent Labs Lab 02/02/17 1600  WBC 17.1*  NEUTROABS 16.4*  HGB 13.8  HCT 40.4  MCV 85.2  PLT 153   Cardiac Enzymes:  Recent Labs Lab 02/02/17 1600  TROPONINI <0.03    BNP (last 3 results) No results for input(s): PROBNP in the last 8760 hours. CBG: No results for input(s): GLUCAP in the last 168 hours.  Radiological Exams on Admission: No results found.  EKG: Independently reviewed.   Assessment/Plan Principal Problem:   Weakness Active Problems:   Paroxysmal atrial fibrillation (HCC)   Acute dyspnea   Acute on chronic diastolic heart failure  (HCC)   Hypothyroidism   Hyperlipidemia   Essential hypertension, benign   Coronary atherosclerosis of native coronary artery   CKD (chronic kidney disease) stage 2, GFR 60-89 ml/min   Diabetes ([place])   History of gout   GERD (gastroesophageal reflux disease)   Urinary retention  1. Paroxysmal Atrial Fibrillation - difficult to control outpatient, I discussed with his cardiologist and it is felt that it is important for him to be admitted to get his Afib under control with IV medications and possible advanced therapies if needed.  Check baseline labs. See orders.  2. Acute dyspnea - suspect this is multifactorial, he is in acute heart failure but is at high risk for PE.  I ordered a vascular US of LEs to rule out DVT. Check an EKG and portable CXR now.  Check a D dimer and get a V/Q scan to evaluate for PE.  Provide supplemental oxygen as needed.   3. Acute on chronic diastolic heart failure - Start IV lasix for diuresis, monitor weights, intake/output, electrolytes.  Pt has had a recent echo. Will defer repeat testing to the cardiology team.   4. Essential Hypertension - blood pressures well controlled, resume home medications.  5. CKD - stage 3 - Renally dose medications as appropriate, follow BMP daily.  Avoid nephrotoxins.   6. CAD - Pt has no current symptoms of chest pain.  Will check a baseline EKG on admission.  7. Diabetes Mellitus, type 2 - will check an A1c, provide supplemental sliding scale insulin as needed.   8. GERD - protonix ordered for GI protection.  9. Urinary retention - continue foley catheter and ensure urology follow up. Continue flomax daily.  10. Enterbacter UTI - Pt recently started on ciprofloxacin per urine culture results, I reviewed the culture,  would continue.   11. Hypothyroidism - resume home levothyroxine.  12. Chronic constipation - he was seen by GI earlier today, they recommend continuing the amitiza.  13. Hyperlipidemia - he is reportedly intolerant of  statins and ezetimibe.     DVT Prophylaxis: heparin [place] Code Status: FULL   Family Communication: none present at bedside  Disposition Plan: TBD   Time spent: 55 minutes  Irwin Brakeman, MD [facility] Pager [phone]  If 7PM-7AM, please contact night-coverage [email or web address] Password [email or web address] 02/09/2017, 11:37 AM

## 2017-02-09 NOTE — Progress Notes (Signed)
Subjective:    Patient ID: Adam Peck, male    DOB: 1933-06-02, 83 y.o.   MRN: [id number]  HPI Here today for f/u. Underwent a colonoscopy in August of this year (screening). He tells me this was the first Colonoscopy he has ever undergone. He states he is doing fairly good. He is having a BM daily.  Sometimes he will skip a day. Presently taking Anitiza twice a day for constipation. Appetite is decent.  Hx of atrial fib and maintained on ASA 325mg  daily.  States he has a kidney infection and is presently taking Keflex.    Review of Systems Past Medical History:  Diagnosis Date  . Anxiety   . Arthritis   . Carpal tunnel syndrome   . Chronic elbow pain, right   . Chronic neck pain   . Coronary atherosclerosis of native coronary artery    CABG 08/2012: LIMA-LAD; SVG-RI; SVG-OM1; SVG-PD; DES to the OM and RCA 2005  . ED (erectile dysfunction)   . Enlarged prostate   . Essential hypertension, benign   . GERD (gastroesophageal reflux disease)   . Headache(784.0)   . History of gout   . Hypothyroidism   . Mixed hyperlipidemia    Intolerant to statins  . Orthostatic hypotension   . Paroxysmal atrial fibrillation (HCC)    Postoperative  . Sinus bradycardia   . Tick bite 06/2012  . Type 2 diabetes mellitus ([place])     Past Surgical History:  Procedure Laterality Date  . CARDIAC CATHETERIZATION    . CARPAL TUNNEL RELEASE Right   . CATARACT EXTRACTION W/PHACO Left 05/21/2014   Procedure: CATARACT EXTRACTION PHACO AND INTRAOCULAR LENS PLACEMENT (IOC);  Surgeon: Tonny Branch, MD;  Location: [facility];  Service: Ophthalmology;  Laterality: Left;  CDE:8.18  . CATARACT EXTRACTION W/PHACO Right 06/07/2014   Procedure: CATARACT EXTRACTION PHACO AND INTRAOCULAR LENS PLACEMENT RIGHT;  Surgeon: Tonny Branch, MD;  Location: [facility];  Service: Ophthalmology;  Laterality: Right;  CDE:10.78  . CERVICAL FUSION    . COLONOSCOPY N/A 01/07/2017   Procedure: COLONOSCOPY;  Surgeon: Rogene Houston, MD;   Location: [facility];  Service: Endoscopy;  Laterality: N/A;  1:00-rescheduled 8/30 @ 12:00 per Lelon Frohlich  . CORONARY ARTERY BYPASS GRAFT N/A 08/30/2012   Procedure: CORONARY ARTERY BYPASS GRAFTING (CABG);  Surgeon: Ivin Poot, MD;  Location: [place];  Service: Open Heart Surgery;  Laterality: N/A;  . HERNIA REPAIR    . INTRAOPERATIVE TRANSESOPHAGEAL ECHOCARDIOGRAM N/A 08/30/2012   Procedure: INTRAOPERATIVE TRANSESOPHAGEAL ECHOCARDIOGRAM;  Surgeon: Ivin Poot, MD;  Location: [place];  Service: Open Heart Surgery;  Laterality: N/A;  . L4-5 laminectomy     x 2   . POLYPECTOMY  01/07/2017   Procedure: POLYPECTOMY;  Surgeon: Rogene Houston, MD;  Location: [facility];  Service: Endoscopy;;  colon    Allergies  Allergen Reactions  . Ezetimibe Other (See Comments)    Reaction:muscle cramps  . Statins Other (See Comments)    Reaction:muscle cramps    Current Outpatient Prescriptions on File Prior to Visit  Medication Sig Dispense Refill  . amiodarone (PACERONE) 200 MG tablet Take 1 tablet (200 mg total) by mouth 2 (two) times daily. 180 tablet 3  . amLODipine (NORVASC) 5 MG tablet Take 5 mg by mouth daily.    Marland Kitchen aspirin EC 81 MG tablet Take 325 mg by mouth daily.     . Aspirin-Salicylamide-Caffeine (BC FAST PAIN RELIEF) 650-195-33.3 MG PACK Take 1 packet by mouth  daily as needed (for pain.).    Marland Kitchen cephALEXin (KEFLEX) 500 MG capsule Take 1 capsule (500 mg total) by mouth 3 (three) times daily. 15 capsule 0  . dexamethasone (DECADRON) 4 MG tablet Take 6 mg by mouth 3 (three) times daily. 28 day course starting on 01/15/2017    . HYDROcodone-acetaminophen (NORCO) 10-325 MG tablet Take 1 tablet by mouth daily as needed for moderate pain or severe pain.     Marland Kitchen levothyroxine (SYNTHROID, LEVOTHROID) 75 MCG tablet Take 75 mcg by mouth daily before breakfast.     . losartan (COZAAR) 25 MG tablet Take 25 mg by mouth daily.    Marland Kitchen lubiprostone (AMITIZA) 24 MCG capsule Take 1 capsule (24 mcg total) by mouth  2 (two) times daily with a meal. 60 capsule 5  . metFORMIN (GLUCOPHAGE) 500 MG tablet Take 500 mg by mouth 2 (two) times daily with a meal.    . metoprolol tartrate (LOPRESSOR) 25 MG tablet Take 0.5 tablets (12.5 mg total) by mouth 2 (two) times daily. 180 tablet 3  . NITROSTAT 0.4 MG SL tablet Take 0.4 tablets by mouth every 5 (five) minutes x 3 doses as needed for chest pain.     Marland Kitchen risperiDONE (RISPERDAL) 1 MG tablet Take 1 mg by mouth at bedtime.     . Tamsulosin HCl (FLOMAX) 0.4 MG CAPS Take 0.4 mg by mouth at bedtime.      No current facility-administered medications on file prior to visit.         Objective:   Physical Exam  Blood pressure 122/70, pulse 72, temperature 98.3 F (36.8 C), height 5' 10.5" (1.791 m), weight 199 lb (90.3 kg). Alert and oriented. Skin warm and dry. Oral mucosa is moist.   . Sclera anicteric, conjunctivae is pink. Thyroid not enlarged. No cervical lymphadenopathy. Lungs clear. Heart regular rate and rhythm.  Abdomen is soft. Bowel sounds are positive. No hepatomegaly. No abdominal masses felt. No tenderness.  No edema to lower extremities. Patient is alert and oriented.         Assessment & Plan:  Constipation. Continue the Amitiza.every other day. OV in 1 year.

## 2017-02-09 NOTE — Progress Notes (Signed)
02/09/2017 3:03 PM  VQ scan high probability of PE. Will start heparin with help of pharmacist.    Murvin Natal MD

## 2017-02-09 NOTE — Progress Notes (Signed)
ANTICOAGULATION CONSULT NOTE - Initial Consult  Pharmacy Consult for Heparin and Warfarin Indication: pulmonary embolus  Allergies  Allergen Reactions  . Ezetimibe Other (See Comments)    Reaction:muscle cramps  . Statins Other (See Comments)    Reaction:muscle cramps    Patient Measurements: Height: 5' 10.5" (179.1 cm) Weight: 196 lb 9.6 oz (89.2 kg) IBW/kg (Calculated) : 74.15 HEPARIN DW (KG): 89.2  Vital Signs: Temp: 97.5 F (36.4 C) (10/02 1440) Temp Source: Oral (10/02 1440) BP: 117/67 (10/02 1440) Pulse Rate: 68 (10/02 1440)  Labs:  Recent Labs  02/09/17 1152 02/09/17 1203  HGB 13.3  --   HCT 40.3  --   PLT 172  --   APTT  --  30  LABPROT  --  13.3  INR  --  1.02  CREATININE 1.42*  --   CKTOTAL 52  --     Estimated Creatinine Clearance: 47.1 mL/min (A) (by C-G formula based on SCr of 1.42 mg/dL (H)).   Medical History: Past Medical History:  Diagnosis Date  . Anxiety   . Arthritis   . Carpal tunnel syndrome   . Chronic elbow pain, right   . Chronic neck pain   . Coronary atherosclerosis of native coronary artery    CABG 08/2012: LIMA-LAD; SVG-RI; SVG-OM1; SVG-PD; DES to the OM and RCA 2005  . ED (erectile dysfunction)   . Enlarged prostate   . Essential hypertension, benign   . GERD (gastroesophageal reflux disease)   . Headache(784.0)   . History of gout   . Hypothyroidism   . Mixed hyperlipidemia    Intolerant to statins  . Orthostatic hypotension   . Paroxysmal atrial fibrillation (HCC)    Postoperative  . Sinus bradycardia   . Tick bite 06/2012  . Type 2 diabetes mellitus (HCC)     Medications:  Prescriptions Prior to Admission  Medication Sig Dispense Refill Last Dose  . amiodarone (PACERONE) 200 MG tablet Take 1 tablet (200 mg total) by mouth 2 (two) times daily. 180 tablet 3 02/09/2017 at Unknown time  . amLODipine (NORVASC) 5 MG tablet Take 5 mg by mouth daily.   02/09/2017 at Unknown time  . aspirin EC 81 MG tablet Take 324 mg by  mouth daily.    02/09/2017 at Unknown time  . Aspirin-Salicylamide-Caffeine (BC FAST PAIN RELIEF) 650-195-33.3 MG PACK Take 1 packet by mouth daily as needed (for pain.).   Past Week at Unknown time  . dexamethasone (DECADRON) 4 MG tablet Take 6 mg by mouth 3 (three) times daily. 28 day course starting on 01/15/2017   02/09/2017 at Unknown time  . levothyroxine (SYNTHROID, LEVOTHROID) 75 MCG tablet Take 75 mcg by mouth daily before breakfast.    02/09/2017 at Unknown time  . losartan (COZAAR) 100 MG tablet Take 1 tablet by mouth daily.   02/09/2017 at Unknown time  . lubiprostone (AMITIZA) 24 MCG capsule Take 1 capsule (24 mcg total) by mouth 2 (two) times daily with a meal. 60 capsule 5 Past Week at Unknown time  . metFORMIN (GLUCOPHAGE) 500 MG tablet Take 500 mg by mouth 2 (two) times daily with a meal.   02/09/2017 at Unknown time  . metoprolol tartrate (LOPRESSOR) 25 MG tablet Take 0.5 tablets (12.5 mg total) by mouth 2 (two) times daily. 180 tablet 3 02/09/2017 at 730  . NITROSTAT 0.4 MG SL tablet Take 0.4 tablets by mouth every 5 (five) minutes x 3 doses as needed for chest pain.    Taking  .  risperiDONE (RISPERDAL) 1 MG tablet Take 1 mg by mouth at bedtime.    02/08/2017 at Unknown time  . sulfamethoxazole-trimethoprim (BACTRIM DS,SEPTRA DS) 800-160 MG tablet Take 1 tablet by mouth 2 (two) times daily.   02/09/2017 at Unknown time  . Tamsulosin HCl (FLOMAX) 0.4 MG CAPS Take 0.4 mg by mouth at bedtime.    02/08/2017 at Unknown time    Assessment: 83 yo male sent to ED from cardiologist office with weakness and SOB. VQ was performed and showed high probability of PE. MD asked pharmacy to start heparin and coumadin. Baseline labs done and okay for protocol.   Goal of Therapy:  INR 2-3 Heparin level 0.3-0.7 units/ml Monitor platelets by anticoagulation protocol: Yes   Plan:  Give 4000 units bolus x 1 Start heparin infusion at 1400 units/hr Check anti-Xa level in 6-8 hours and daily while on  heparin Coumadin 5mg  po x 1 PT-INR daily Continue to monitor H&H and platelets  Adam Peck, BS [place], BCPS Clinical Pharmacist Pager [phone]  02/09/2017,3:04 PM

## 2017-02-09 NOTE — Patient Instructions (Signed)
Admit to tele at [facility] room 322

## 2017-02-10 ENCOUNTER — Inpatient Hospital Stay (HOSPITAL_COMMUNITY): Payer: Medicare Other

## 2017-02-10 DIAGNOSIS — I25118 Atherosclerotic heart disease of native coronary artery with other forms of angina pectoris: Secondary | ICD-10-CM

## 2017-02-10 DIAGNOSIS — I4892 Unspecified atrial flutter: Secondary | ICD-10-CM

## 2017-02-10 LAB — VITAMIN D 25 HYDROXY (VIT D DEFICIENCY, FRACTURES): VIT D 25 HYDROXY: 25.2 ng/mL — AB (ref 30.0–100.0)

## 2017-02-10 LAB — COMPREHENSIVE METABOLIC PANEL
ALBUMIN: 2.7 g/dL — AB (ref 3.5–5.0)
ALK PHOS: 45 U/L (ref 38–126)
ALT: 40 U/L (ref 17–63)
AST: 13 U/L — AB (ref 15–41)
Anion gap: 7 (ref 5–15)
BUN: 30 mg/dL — ABNORMAL HIGH (ref 6–20)
CALCIUM: 8.2 mg/dL — AB (ref 8.9–10.3)
CHLORIDE: 97 mmol/L — AB (ref 101–111)
CO2: 29 mmol/L (ref 22–32)
CREATININE: 1.6 mg/dL — AB (ref 0.61–1.24)
GFR calc Af Amer: 45 mL/min — ABNORMAL LOW (ref >60)
GFR calc non Af Amer: 39 mL/min — ABNORMAL LOW (ref >60)
GLUCOSE: 204 mg/dL — AB (ref 65–99)
Potassium: 4.5 mmol/L (ref 3.5–5.1)
SODIUM: 133 mmol/L — AB (ref 135–145)
Total Bilirubin: 1 mg/dL (ref 0.3–1.2)
Total Protein: 5.5 g/dL — ABNORMAL LOW (ref 6.5–8.1)

## 2017-02-10 LAB — GLUCOSE, CAPILLARY
GLUCOSE-CAPILLARY: 208 mg/dL — AB (ref 65–99)
GLUCOSE-CAPILLARY: 213 mg/dL — AB (ref 65–99)
Glucose-Capillary: 234 mg/dL — ABNORMAL HIGH (ref 65–99)
Glucose-Capillary: 227 mg/dL — ABNORMAL HIGH (ref 65–99)

## 2017-02-10 LAB — CBC
HCT: 39.2 % (ref 39.0–52.0)
Hemoglobin: 13 g/dL (ref 13.0–17.0)
MCH: 28.4 pg (ref 26.0–34.0)
MCHC: 33.2 g/dL (ref 30.0–36.0)
MCV: 85.6 fL (ref 78.0–100.0)
PLATELETS: 199 10*3/uL (ref 150–400)
RBC: 4.58 MIL/uL (ref 4.22–5.81)
RDW: 14.7 % (ref 11.5–15.5)
WBC: 11.5 10*3/uL — ABNORMAL HIGH (ref 4.0–10.5)

## 2017-02-10 LAB — LIPID PANEL
CHOL/HDL RATIO: 3.2 ratio
Cholesterol: 160 mg/dL (ref 0–200)
HDL: 50 mg/dL (ref >40)
LDL CALC: 93 mg/dL (ref 0–99)
Triglycerides: 86 mg/dL (ref <150)
VLDL: 17 mg/dL (ref 0–40)

## 2017-02-10 LAB — HEPARIN LEVEL (UNFRACTIONATED)
HEPARIN UNFRACTIONATED: 0.79 [IU]/mL — AB (ref 0.30–0.70)
Heparin Unfractionated: 0.96 IU/mL — ABNORMAL HIGH (ref 0.30–0.70)

## 2017-02-10 LAB — PROTIME-INR
INR: 1.13
PROTHROMBIN TIME: 14.4 s (ref 11.4–15.2)

## 2017-02-10 LAB — MAGNESIUM: MAGNESIUM: 1.4 mg/dL — AB (ref 1.7–2.4)

## 2017-02-10 MED ORDER — WARFARIN SODIUM 5 MG PO TABS: 5.0000 mg | ORAL_TABLET | Freq: Once | ORAL | Status: DC

## 2017-02-10 MED ORDER — ZOLPIDEM TARTRATE 5 MG PO TABS
5.0000 mg | ORAL_TABLET | Freq: Once | ORAL | Status: AC
  Administered 2017-02-10: 5 mg via ORAL
  Filled 2017-02-10: qty 1

## 2017-02-10 MED ORDER — ASPIRIN EC 81 MG PO TBEC
81.0000 mg | DELAYED_RELEASE_TABLET | Freq: Every day | ORAL | Status: DC
  Administered 2017-02-10 – 2017-02-13 (×4): 81 mg via ORAL
  Filled 2017-02-10 (×3): qty 1

## 2017-02-10 MED ORDER — MAGNESIUM SULFATE 2 GM/50ML IV SOLN
2.0000 g | Freq: Once | INTRAVENOUS | Status: AC
  Administered 2017-02-10: 2 g via INTRAVENOUS
  Filled 2017-02-10: qty 50

## 2017-02-10 MED ORDER — RIVAROXABAN 15 MG PO TABS
15.0000 mg | ORAL_TABLET | Freq: Two times a day (BID) | ORAL | Status: DC
  Administered 2017-02-10 – 2017-02-13 (×6): 15 mg via ORAL
  Filled 2017-02-10 (×6): qty 1

## 2017-02-10 MED ORDER — FUROSEMIDE 10 MG/ML IJ SOLN
40.0000 mg | Freq: Every day | INTRAMUSCULAR | Status: DC
  Administered 2017-02-10 – 2017-02-11 (×2): 40 mg via INTRAVENOUS
  Filled 2017-02-10: qty 4

## 2017-02-10 MED ORDER — METOPROLOL TARTRATE 25 MG PO TABS
25.0000 mg | ORAL_TABLET | Freq: Two times a day (BID) | ORAL | Status: DC
  Administered 2017-02-10 – 2017-02-13 (×7): 25 mg via ORAL
  Filled 2017-02-10 (×7): qty 1

## 2017-02-10 NOTE — Progress Notes (Addendum)
ANTICOAGULATION CONSULT NOTE - follow up  Pharmacy Consult for Heparin and Warfarin--> xarelto Indication: pulmonary embolus  Allergies  Allergen Reactions  . Ezetimibe Other (See Comments)    Reaction:muscle cramps  . Statins Other (See Comments)    Reaction:muscle cramps    Patient Measurements: Height: 5' 10.5" (179.1 cm) Weight: 196 lb 9.6 oz (89.2 kg) IBW/kg (Calculated) : 74.15 HEPARIN DW (KG): 89.2  Vital Signs: Temp: 98.4 F (36.9 C) (10/03 0700) Temp Source: Oral (10/03 0700) BP: 115/60 (10/03 0700) Pulse Rate: 65 (10/03 0700)  Labs:  Recent Labs  02/09/17 1152 02/09/17 1203 02/09/17 2208 02/10/17 0459 02/10/17 1403  HGB 13.3  --   --  13.0  --   HCT 40.3  --   --  39.2  --   PLT 172  --   --  199  --   APTT  --  30  --   --   --   LABPROT  --  13.3  --  14.4  --   INR  --  1.02  --  1.13  --   HEPARINUNFRC  --   --  1.04* 0.96* 0.79*  CREATININE 1.42*  --   --  1.60*  --   CKTOTAL 52  --   --   --   --     Estimated Creatinine Clearance: 41.8 mL/min (A) (by C-G formula based on SCr of 1.6 mg/dL (H)).   Medical History: Past Medical History:  Diagnosis Date  . Anxiety   . Arthritis   . Carpal tunnel syndrome   . Chronic elbow pain, right   . Chronic neck pain   . Coronary atherosclerosis of native coronary artery    CABG 08/2012: LIMA-LAD; SVG-RI; SVG-OM1; SVG-PD; DES to the OM and RCA 2005  . ED (erectile dysfunction)   . Enlarged prostate   . Essential hypertension, benign   . GERD (gastroesophageal reflux disease)   . Headache(784.0)   . History of gout   . Hypothyroidism   . Mixed hyperlipidemia    Intolerant to statins  . Orthostatic hypotension   . Paroxysmal atrial fibrillation (HCC)    Postoperative  . Sinus bradycardia   . Tick bite 06/2012  . Type 2 diabetes mellitus (HCC)     Medications:  Prescriptions Prior to Admission  Medication Sig Dispense Refill Last Dose  . amiodarone (PACERONE) 200 MG tablet Take 1 tablet (200  mg total) by mouth 2 (two) times daily. 180 tablet 3 02/09/2017 at Unknown time  . amLODipine (NORVASC) 5 MG tablet Take 5 mg by mouth daily.   02/09/2017 at Unknown time  . aspirin EC 81 MG tablet Take 324 mg by mouth daily.    02/09/2017 at Unknown time  . Aspirin-Salicylamide-Caffeine (BC FAST PAIN RELIEF) 650-195-33.3 MG PACK Take 1 packet by mouth daily as needed (for pain.).   Past Week at Unknown time  . dexamethasone (DECADRON) 4 MG tablet Take 6 mg by mouth 3 (three) times daily. 28 day course starting on 01/15/2017   02/09/2017 at Unknown time  . levothyroxine (SYNTHROID, LEVOTHROID) 75 MCG tablet Take 75 mcg by mouth daily before breakfast.    02/09/2017 at Unknown time  . losartan (COZAAR) 100 MG tablet Take 1 tablet by mouth daily.   02/09/2017 at Unknown time  . lubiprostone (AMITIZA) 24 MCG capsule Take 1 capsule (24 mcg total) by mouth 2 (two) times daily with a meal. 60 capsule 5 Past Week at Unknown time  .  metFORMIN (GLUCOPHAGE) 500 MG tablet Take 500 mg by mouth 2 (two) times daily with a meal.   02/09/2017 at Unknown time  . metoprolol tartrate (LOPRESSOR) 25 MG tablet Take 0.5 tablets (12.5 mg total) by mouth 2 (two) times daily. 180 tablet 3 02/09/2017 at 730  . NITROSTAT 0.4 MG SL tablet Take 0.4 tablets by mouth every 5 (five) minutes x 3 doses as needed for chest pain.    Taking  . risperiDONE (RISPERDAL) 1 MG tablet Take 1 mg by mouth at bedtime.    02/08/2017 at Unknown time  . sulfamethoxazole-trimethoprim (BACTRIM DS,SEPTRA DS) 800-160 MG tablet Take 1 tablet by mouth 2 (two) times daily.   02/09/2017 at Unknown time  . Tamsulosin HCl (FLOMAX) 0.4 MG CAPS Take 0.4 mg by mouth at bedtime.    02/08/2017 at Unknown time    Assessment: 83 yo male sent to ED from cardiologist office with weakness and SOB. VQ was performed and showed high probability of PE . In addition, patient with variable heart rate and in Atrial fibrillation/flutter .  MD now transitioning patient to Xarelto.  Goal  of Therapy:  Monitor platelets by anticoagulation protocol: Yes   Plan:  D/C heparin and coumadin Xarelto 15mg  po BID for 21 days, then 20mg  daily Continue to monitor H&H and platelets, s/s of bleeding Educate on [place], BS Pharm D, BCPS Clinical Pharmacist Pager [phone] 02/10/2017,2:59 PM

## 2017-02-10 NOTE — Care Management Note (Signed)
Case Management Note  Patient Details  Name: Adam Peck MRN: [id number] Date of Birth: [birth year]  Subjective/Objective:                  Admitted with PE. Pt is from home, lives with dtr. He is ind with ADL's. He has PCP, transportation and insurance with drug coverage. He uses [facility] with ambulation. He is on oxygen acutely but feels he does not need it. He is active with [facility] for RN and PT services pta. Family is at bedside for assessment.  Action/Plan: Plans to return home with resumption of HH services through [facility], will need order to resume [place] at [place]. CM will cont to follow.   Expected Discharge Date:     02/11/2017             Expected Discharge Plan:  [place]  In-House Referral:  NA  Discharge planning Services  CM Consult  Post Acute Care Choice:  Home Health, Resumption of Svcs/PTA Provider Choice offered to:  Patient  HH Arranged:  RN, PT [facility] Agency:  [place]  Status of Service:  In process, will continue to follow  Sherald Barge, RN 02/10/2017, 1:44 PM

## 2017-02-10 NOTE — Progress Notes (Signed)
Inpatient Diabetes Program Recommendations  AACE/ADA: New Consensus Statement on Inpatient Glycemic Control (2015)  Target Ranges:  Prepandial:   less than 140 mg/dL      Peak postprandial:   less than 180 mg/dL (1-2 hours)      Critically ill patients:  140 - 180 mg/dL   Results for GERVIS, GABA (MRN [id number]) as of 02/10/2017 09:48  Ref. Range 02/09/2017 17:17 02/09/2017 21:15 02/10/2017 08:07  Glucose-Capillary Latest Ref Range: 65 - 99 mg/dL 213 (H) 201 (H) 208 (H)  Results for KEDAR, SEDANO (MRN [id number]) as of 02/10/2017 09:48  Ref. Range 02/09/2017 11:50  Hemoglobin A1C Latest Ref Range: 4.8 - 5.6 % 8.7 (H)   Review of Glycemic Control  Diabetes history: DM2 Outpatient Diabetes medications: Metformin 500 mg BID Current orders for Inpatient glycemic control: Novolog 0-9 units TID with meals  Inpatient Diabetes Program Recommendations: Correction (SSI): Please consider increasing Novolog correction to moderate scale (0-15 units TID) and adding Novolog 0-5 units QHS for bedtime correction. A1C: A1C 8.7% on 02/09/17 indicating an average glucose of 203 mg/dl over the past 2-3 months. Noted patient has Decadron 6 mg TID listed on home medication list (started 28 day course started on 01/15/17). Anticipate Decadron contributing to elevated glucose and A1C. Recommend patient follow up with PCP regarding DM control.  Thanks, Barnie Alderman, RN, MSN, CDE Diabetes Coordinator Inpatient Diabetes Program [phone] (Team Pager from 8am to 5pm)

## 2017-02-10 NOTE — Progress Notes (Signed)
Pharmacy contacted regarding recalculation of Heparin drip.

## 2017-02-10 NOTE — Progress Notes (Signed)
PT Cancellation Note  Patient Details Name: Adam Peck MRN: [id number] DOB: 1933-09-16   Cancelled Treatment:    Reason Eval/Treat Not Completed: Medical issues which prohibited therapy.  Patient not medically stable at this time secondary to + PE and high HR and put on hold per patient's PA.  Please re-order physical therapy when patient medically stable.  Thank you.   8:52 AM, 02/10/17 Lonell Grandchild, MPT Physical Therapist with [facility] [phone] office [phone] mobile phone

## 2017-02-10 NOTE — Progress Notes (Addendum)
PROGRESS NOTE                                                                                                                                                                                                             Patient Demographics:    Adam Peck, is a 83 y.o. male, DOB - 05-14-1933, XQJ:[id number]  Admit date - 02/09/2017   Admitting Physician Murlean Iba, MD  Outpatient Primary MD for the patient is Burdine, Virgina Evener, MD  LOS - 1  Outpatient Specialists:Dr. Domenic Polite  No chief complaint on file.      Brief Narrative   83 year old male with newly diagnosed A. fib with RVR being treated on beta blocker and amiodarone, CAD with history of CABG in 2014, hypertension, BPH with urinary retention on indwelling Foley catheter who was seen in cardiologist office for generalized weakness and increased shortness of breath for past several days. Also has been unsteady on his feet. Patient was seen in the cardiology office with uncontrolled A. fib with fluctuating heart rate (occasionally going as high as 150s). Patient was also increasingly short of breath. Patient omitted to hospitalist service for father management of his increasing shortness of breath with generalized weakness and poorly controlled A. fib. A VQ scan done following admission showed a high probability for PE.   Subjective:   Reports feeling weak. Some improvement in his dyspnea. Denies chest pain.   Assessment  & Plan :    Principal Problem:   Pulmonary embolus (HCC) V/Q scan showing high probability for PE. Lower extremity negative for DVT. supportive care with oxygen. Started on IV heparin. Transition to oral Xarelto per cardiology recommendation.    Active Problems: Atrial fibrillation with RVR Likely contributed by both diastolic CHF and PE. [id number] of 6.  Now on anticoagulation. Metoprolol dose increased by cardiology. Continue  amiodarone. Blood pressure is low normal this afternoon. Will monitor for now.    Hypothyroidism TSH normal. Continue Synthroid.  Acute diastolic CHF. Getting IV Lasix with good diuresis. (-4.1 L since admission)  Slightly worsened renal function in a.m. lab. Lasix dose reduced. Continue losartan and beta blocker.  Coronary artery disease Asymptomatic. Aspirin dose reduced 81 mg daily since he is on anticoagulation. Continue beta blocker.  Chronic kidney disease stage III Lasix dose reduced due to mildly worsened renal function. Monitor. Avoid  nephrotoxins.    Diabetes mellitus type 2 uncontrolled A1c of 8.7. CBC stable. Monitor on sliding scale coverage.  Generalized weakness/failure to thrive. PT evaluation.   BPH with  urinary retention Has chronic indwelling Foley. Continue Flomax.  Hypotension Blood pressure soft this afternoon with systolic blood pressure in 90s. Patient is on IV Lasix, losartan, metoprolol and amlodipine. Will hold his amlodipine.  Enterobacter UTI Recently started on Cipro as outpatient based on culture. Continue antibiotic.  Hyperlipidemia Intolerant to statin and Zetia.  Hypomagnesemia Replenished  Chronic constipation Seen by GI as outpatient on 10/2. Continue amitza.    Code Status : Full code  Family Communication  : Daughter at bedside  Disposition Plan  : Pending clinical improvement/PT evaluation  Barriers For Discharge : Active symptoms  Consults  : Cardiology  Procedures  :  VQ scan neck slight Doppler lower extremity  DVT Prophylaxis  :  Xarelto  Lab Results  Component Value Date   PLT 199 02/10/2017    Antibiotics  :    Anti-infectives    Start     Dose/Rate Route Frequency Ordered Stop   02/09/17 1300  ciprofloxacin (CIPRO) tablet 500 mg     500 mg Oral 2 times daily 02/09/17 1240          Objective:   Vitals:   02/09/17 1440 02/09/17 2140 02/10/17 0700 02/10/17 1500  BP: 117/67 (!) 108/58 115/60 92/66    Pulse: 68 72 65 71  Resp: 20 18 15 20   Temp: (!) 97.5 F (36.4 C) 97.8 F (36.6 C) 98.4 F (36.9 C) 98.4 F (36.9 C)  TempSrc: Oral Oral Oral Oral  SpO2: 97% 96% 95% 97%  Weight: 89.2 kg (196 lb 9.6 oz)     Height: 5' 10.5" (1.791 m)       Wt Readings from Last 3 Encounters:  02/09/17 89.2 kg (196 lb 9.6 oz)  02/09/17 90.8 kg (200 lb 3.2 oz)  02/09/17 90.3 kg (199 lb)     Intake/Output Summary (Last 24 hours) at 02/10/17 1536 Last data filed at 02/10/17 0900  Gross per 24 hour  Intake           1206.3 ml  Output             4300 ml  Net          -3093.7 ml     Physical Exam  Gen: not in distress,  appears fatigued HEENT: no pallor, moist mucosa, supple neck Chest: Fine bibasilar crackles CVS: S1 and S2 irregularly irregular GI: soft, NT, ND, BS+ Musculoskeletal: warm, 2+ pitting edema bilaterally CNS: Alert and oriented, nonfocal    Data Review:    CBC  Recent Labs Lab 02/09/17 1152 02/10/17 0459  WBC 14.0* 11.5*  HGB 13.3 13.0  HCT 40.3 39.2  PLT 172 199  MCV 86.9 85.6  MCH 28.7 28.4  MCHC 33.0 33.2  RDW 14.6 14.7  LYMPHSABS 0.6*  --   MONOABS 0.8  --   EOSABS 0.3  --   BASOSABS 0.0  --     Chemistries   Recent Labs Lab 02/09/17 1152 02/10/17 0459  NA 133* 133*  K 4.3 4.5  CL 96* 97*  CO2 28 29  GLUCOSE 188* 204*  BUN 28* 30*  CREATININE 1.42* 1.60*  CALCIUM 8.6* 8.2*  MG 1.7 1.4*  AST 15 13*  ALT 48 40  ALKPHOS 45 45  BILITOT 0.7 1.0   ------------------------------------------------------------------------------------------------------------------  Recent Labs  02/10/17 0459  CHOL 160  HDL 50  LDLCALC 93  TRIG 86  CHOLHDL 3.2    Lab Results  Component Value Date   HGBA1C 8.7 (H) 02/09/2017   ------------------------------------------------------------------------------------------------------------------  Recent Labs  02/09/17 1152  TSH 2.799    ------------------------------------------------------------------------------------------------------------------ No results for input(s): VITAMINB12, FOLATE, FERRITIN, TIBC, IRON, RETICCTPCT in the last 72 hours.  Coagulation profile  Recent Labs Lab 02/09/17 1203 02/10/17 0459  INR 1.02 1.13     Recent Labs  02/09/17 1152  DDIMER 5.87*    Cardiac Enzymes No results for input(s): CKMB, TROPONINI, MYOGLOBIN in the last 168 hours.  Invalid input(s): CK ------------------------------------------------------------------------------------------------------------------    Component Value Date/Time   BNP 78.0 02/09/2017 1152    Inpatient Medications  Scheduled Meds: . amiodarone  200 mg Oral BID  . amLODipine  5 mg Oral Daily  . aspirin EC  81 mg Oral Daily  . ciprofloxacin  500 mg Oral BID  . furosemide  40 mg Intravenous Daily  . insulin aspart  0-9 Units Subcutaneous TID WC  . levothyroxine  75 mcg Oral QAC breakfast  . losartan  25 mg Oral Daily  . [START ON 02/11/2017] lubiprostone  24 mcg Oral QODAY  . metoprolol tartrate  25 mg Oral BID  . pantoprazole  40 mg Oral Q0600  . potassium chloride  40 mEq Oral BID  . risperiDONE  1 mg Oral QHS  . Rivaroxaban  15 mg Oral BID WC  . sodium chloride flush  3 mL Intravenous Q12H  . tamsulosin  0.4 mg Oral QHS   Continuous Infusions: . sodium chloride    . magnesium sulfate 1 - 4 g bolus IVPB     PRN Meds:.sodium chloride, acetaminophen, ondansetron (ZOFRAN) IV, sodium chloride flush  Micro Results Recent Results (from the past 240 hour(s))  Urine culture     Status: Abnormal   Collection Time: 02/02/17  5:12 PM  Result Value Ref Range Status   Specimen Description URINE, CLEAN CATCH  Final   Special Requests NONE  Final   Culture >=100,000 COLONIES/mL ENTEROBACTER SPECIES (A)  Final   Report Status 02/05/2017 FINAL  Final   Organism ID, Bacteria ENTEROBACTER SPECIES (A)  Final      Susceptibility    Enterobacter species - MIC*    CEFAZOLIN >=64 RESISTANT Resistant     CEFTRIAXONE >=64 RESISTANT Resistant     CIPROFLOXACIN <=0.25 SENSITIVE Sensitive     GENTAMICIN <=1 SENSITIVE Sensitive     IMIPENEM 0.5 SENSITIVE Sensitive     NITROFURANTOIN 128 RESISTANT Resistant     TRIMETH/SULFA <=20 SENSITIVE Sensitive     PIP/TAZO 64 INTERMEDIATE Intermediate     * >=100,000 COLONIES/mL ENTEROBACTER SPECIES    Radiology Reports Dg Chest 2 View  Result Date: 02/02/2017 CLINICAL DATA:  Dyspnea. Heart fluttering for 1 week. History of atrial fibrillation. EXAM: CHEST  2 VIEW COMPARISON:  10/18/2012 radiographs. FINDINGS: The heart size and mediastinal contours are stable status post median sternotomy and CABG. Left basilar scarring or atelectasis is similar to the prior study. There is no edema, confluent airspace opacity, pleural effusion or pneumothorax. No acute osseous findings are seen. IMPRESSION: Stable postoperative chest.  No acute cardiopulmonary process. Electronically Signed   By: Richardean Sale M.D.   On: 02/02/2017 16:50   Nm Pulmonary Perf And Vent  Result Date: 02/09/2017 CLINICAL DATA:  Short of breath. Elevated D-dimer. Concern pulmonary embolism. EXAM: NUCLEAR MEDICINE VENTILATION - PERFUSION LUNG SCAN TECHNIQUE: Ventilation images were obtained in  multiple projections using inhaled aerosol Tc-97m DTPA. Perfusion images were obtained in multiple projections after intravenous injection of Tc-97m MAA. RADIOPHARMACEUTICALS:  Thirty-three mCi Technetium-57m DTPA aerosol inhalation and 3.8 mCi Technetium-28m MAA IV COMPARISON:  Chest radiograph 02/09/2017 FINDINGS: Ventilation: Central deposition of the radiotracer in the proximal bronchi. There is no peripheral perfusion ventilation defects identified. Perfusion: Several small peripheral perfusion defects are present in the upper lobes on LEFT and RIGHT nodule adn to a lesser degree lower lobes. a These small peripheral perfusion defects  are not matched by ventilation defects. IMPRESSION: Several small unmatched peripheral perfusion defects in the upper lobes are concerning for acute pulmonary embolism. High probability. These results will be called to the ordering clinician or representative by the Radiologist Assistant, and communication documented in the PACS or zVision Dashboard. Electronically Signed   By: Suzy Bouchard M.D.   On: 02/09/2017 14:29   US Venous Img Lower Bilateral  Result Date: 02/09/2017 CLINICAL DATA:  Bilateral lower extremity edema with shortness of breath for 10 days EXAM: BILATERAL LOWER EXTREMITY VENOUS DOPPLER ULTRASOUND TECHNIQUE: Gray-scale sonography with graded compression, as well as color Doppler and duplex ultrasound were performed to evaluate the lower extremity deep venous systems from the level of the common femoral vein and including the common femoral, femoral, profunda femoral, popliteal and calf veins including the posterior tibial, peroneal and gastrocnemius veins when visible. The superficial great saphenous vein was also interrogated. Spectral Doppler was utilized to evaluate flow at rest and with distal augmentation maneuvers in the common femoral, femoral and popliteal veins. COMPARISON:  None. FINDINGS: RIGHT LOWER EXTREMITY Common Femoral Vein: No evidence of thrombus. Normal compressibility, respiratory phasicity and response to augmentation. Saphenofemoral Junction: No evidence of thrombus. Normal compressibility and flow on color Doppler imaging. Profunda Femoral Vein: No evidence of thrombus. Normal compressibility and flow on color Doppler imaging. Femoral Vein: No evidence of thrombus. Normal compressibility, respiratory phasicity and response to augmentation. Popliteal Vein: No evidence of thrombus. Normal compressibility, respiratory phasicity and response to augmentation. Calf Veins: No evidence of thrombus. Normal compressibility and flow on color Doppler imaging. Superficial Great  Saphenous Vein: No evidence of thrombus. Normal compressibility and flow on color Doppler imaging. Venous Reflux:  None. Other Findings:  None. LEFT LOWER EXTREMITY Common Femoral Vein: No evidence of thrombus. Normal compressibility, respiratory phasicity and response to augmentation. Saphenofemoral Junction: No evidence of thrombus. Normal compressibility and flow on color Doppler imaging. Profunda Femoral Vein: No evidence of thrombus. Normal compressibility and flow on color Doppler imaging. Femoral Vein: No evidence of thrombus. Normal compressibility, respiratory phasicity and response to augmentation. Popliteal Vein: No evidence of thrombus. Normal compressibility, respiratory phasicity and response to augmentation. Calf Veins: No evidence of thrombus. Normal compressibility and flow on color Doppler imaging. Superficial Great Saphenous Vein: No evidence of thrombus. Normal compressibility and flow on color Doppler imaging. Venous Reflux:  None. Other Findings: Left popliteal fossa minimally complex thick-walled Bakers cyst measures 4.5 x 3.6 cm IMPRESSION: No evidence of DVT within either lower extremity. 4.5 cm left Bakers cyst. Electronically Signed   By: Jerilynn Mages.  Shick M.D.   On: 02/09/2017 13:21   Dg Chest Port 1 View  Result Date: 02/10/2017 CLINICAL DATA:  Shortness of Breath EXAM: PORTABLE CHEST 1 VIEW COMPARISON:  February 09, 2017 FINDINGS: There is left base atelectasis with small left pleural effusion. Lungs elsewhere are clear. Heart is upper normal in size with pulmonary vascularity within normal limits. No adenopathy. Patient is status post median sternotomy.  No evident bone lesions. No appreciable pneumothorax. IMPRESSION: Mild left base atelectasis with small left pleural effusion. Lungs elsewhere are clear. Stable cardiac silhouette. Electronically Signed   By: Lowella Grip III M.D.   On: 02/10/2017 07:57   Dg Chest Port 1 View  Result Date: 02/09/2017 CLINICAL DATA:  Shortness of  breath, hypertension, history of coronary artery disease and CABG, diabetes, CHF, former smoker. EXAM: PORTABLE CHEST 1 VIEW COMPARISON:  Chest x-ray of February 02, 2017 FINDINGS: The lungs are adequately inflated. The interstitial markings are slightly more conspicuous today. The cardiac silhouette remains enlarged. The central pulmonary vascularity is mildly prominent. Patient has undergone previous median sternotomy. IMPRESSION: The findings suggest low-grade CHF with very mild interstitial edema. There is no alveolar pneumonia. Electronically Signed   By: David  Martinique M.D.   On: 02/09/2017 12:07    Time Spent in minutes  35   Louellen Molder M.D on 02/10/2017 at 3:36 PM  Between 7am to 7pm - Pager - [phone]  After 7pm go to [email or web address] - password [facility]  [facility] -  Office  [phone]

## 2017-02-10 NOTE — Progress Notes (Addendum)
Progress Note  Patient Name: Adam Peck Date of Encounter: 02/10/2017  Primary Cardiologist: Dr. Domenic Polite  Subjective   Breathing "ok". Not on O2  Inpatient Medications    Scheduled Meds: . amiodarone  200 mg Oral BID  . amLODipine  5 mg Oral Daily  . aspirin EC  324 mg Oral Daily  . ciprofloxacin  500 mg Oral BID  . furosemide  40 mg Intravenous BID  . insulin aspart  0-9 Units Subcutaneous TID WC  . levothyroxine  75 mcg Oral QAC breakfast  . losartan  25 mg Oral Daily  . [START ON 02/11/2017] lubiprostone  24 mcg Oral QODAY  . metoprolol tartrate  12.5 mg Oral BID  . pantoprazole  40 mg Oral Q0600  . potassium chloride  40 mEq Oral BID  . risperiDONE  1 mg Oral QHS  . sodium chloride flush  3 mL Intravenous Q12H  . tamsulosin  0.4 mg Oral QHS  . warfarin  5 mg Oral Once  . Warfarin - Pharmacist Dosing Inpatient   Does not apply q1800   Continuous Infusions: . sodium chloride    . heparin Stopped (02/10/17 0518)   PRN Meds: sodium chloride, acetaminophen, ondansetron (ZOFRAN) IV, sodium chloride flush   Vital Signs    Vitals:   02/09/17 1100 02/09/17 1440 02/09/17 2140 02/10/17 0700  BP:  117/67 (!) 108/58 115/60  Pulse:  68 72 65  Resp:  20 18 15   Temp:  (!) 97.5 F (36.4 C) 97.8 F (36.6 C) 98.4 F (36.9 C)  TempSrc:  Oral Oral Oral  SpO2:  97% 96% 95%  Weight: 200 lb 3.2 oz (90.8 kg) 196 lb 9.6 oz (89.2 kg)    Height: 5\' 11"  (1.803 m) 5' 10.5" (1.791 m)      Intake/Output Summary (Last 24 hours) at 02/10/17 0809 Last data filed at 02/10/17 0518  Gross per 24 hour  Intake            846.3 ml  Output             4300 ml  Net          -3453.7 ml   Filed Weights   02/09/17 1100 02/09/17 1440  Weight: 200 lb 3.2 oz (90.8 kg) 196 lb 9.6 oz (89.2 kg)    Telemetry    Afib at 140/m Personally Reviewed  ECG       Physical Exam    GEN: No acute distress.   Neck: Increased JVD Cardiac: irreg irreg at 140/m, no murmurs, rubs, or gallops.    Respiratory: Decreased BS with crackles at bases. GI: Soft, nontender, non-distended  MS: plus 2 edema; No deformity. Neuro:  Nonfocal  Psych: Normal affect   Labs    Chemistry Recent Labs Lab 02/09/17 1152 02/10/17 0459  NA 133* 133*  K 4.3 4.5  CL 96* 97*  CO2 28 29  GLUCOSE 188* 204*  BUN 28* 30*  CREATININE 1.42* 1.60*  CALCIUM 8.6* 8.2*  PROT 5.8* 5.5*  ALBUMIN 2.8* 2.7*  AST 15 13*  ALT 48 40  ALKPHOS 45 45  BILITOT 0.7 1.0  GFRNONAA 45* 39*  GFRAA 52* 45*  ANIONGAP 9 7     Hematology Recent Labs Lab 02/09/17 1152 02/10/17 0459  WBC 14.0* 11.5*  RBC 4.64 4.58  HGB 13.3 13.0  HCT 40.3 39.2  MCV 86.9 85.6  MCH 28.7 28.4  MCHC 33.0 33.2  RDW 14.6 14.7  PLT 172 199  Cardiac EnzymesNo results for input(s): TROPONINI in the last 168 hours. No results for input(s): TROPIPOC in the last 168 hours.   BNP Recent Labs Lab 02/09/17 1152  BNP 78.0     DDimer  Recent Labs Lab 02/09/17 1152  DDIMER 5.87*     Radiology    Nm Pulmonary Perf And Vent  Result Date: 02/09/2017 CLINICAL DATA:  Short of breath. Elevated D-dimer. Concern pulmonary embolism. EXAM: NUCLEAR MEDICINE VENTILATION - PERFUSION LUNG SCAN TECHNIQUE: Ventilation images were obtained in multiple projections using inhaled aerosol Tc-66m DTPA. Perfusion images were obtained in multiple projections after intravenous injection of Tc-11m MAA. RADIOPHARMACEUTICALS:  Thirty-three mCi Technetium-21m DTPA aerosol inhalation and 3.8 mCi Technetium-64m MAA IV COMPARISON:  Chest radiograph 02/09/2017 FINDINGS: Ventilation: Central deposition of the radiotracer in the proximal bronchi. There is no peripheral perfusion ventilation defects identified. Perfusion: Several small peripheral perfusion defects are present in the upper lobes on LEFT and RIGHT nodule adn to a lesser degree lower lobes. a These small peripheral perfusion defects are not matched by ventilation defects. IMPRESSION: Several small  unmatched peripheral perfusion defects in the upper lobes are concerning for acute pulmonary embolism. High probability. These results will be called to the ordering clinician or representative by the Radiologist Assistant, and communication documented in the PACS or zVision Dashboard. Electronically Signed   By: Suzy Bouchard M.D.   On: 02/09/2017 14:29   US Venous Img Lower Bilateral  Result Date: 02/09/2017 CLINICAL DATA:  Bilateral lower extremity edema with shortness of breath for 10 days EXAM: BILATERAL LOWER EXTREMITY VENOUS DOPPLER ULTRASOUND TECHNIQUE: Gray-scale sonography with graded compression, as well as color Doppler and duplex ultrasound were performed to evaluate the lower extremity deep venous systems from the level of the common femoral vein and including the common femoral, femoral, profunda femoral, popliteal and calf veins including the posterior tibial, peroneal and gastrocnemius veins when visible. The superficial great saphenous vein was also interrogated. Spectral Doppler was utilized to evaluate flow at rest and with distal augmentation maneuvers in the common femoral, femoral and popliteal veins. COMPARISON:  None. FINDINGS: RIGHT LOWER EXTREMITY Common Femoral Vein: No evidence of thrombus. Normal compressibility, respiratory phasicity and response to augmentation. Saphenofemoral Junction: No evidence of thrombus. Normal compressibility and flow on color Doppler imaging. Profunda Femoral Vein: No evidence of thrombus. Normal compressibility and flow on color Doppler imaging. Femoral Vein: No evidence of thrombus. Normal compressibility, respiratory phasicity and response to augmentation. Popliteal Vein: No evidence of thrombus. Normal compressibility, respiratory phasicity and response to augmentation. Calf Veins: No evidence of thrombus. Normal compressibility and flow on color Doppler imaging. Superficial Great Saphenous Vein: No evidence of thrombus. Normal compressibility and  flow on color Doppler imaging. Venous Reflux:  None. Other Findings:  None. LEFT LOWER EXTREMITY Common Femoral Vein: No evidence of thrombus. Normal compressibility, respiratory phasicity and response to augmentation. Saphenofemoral Junction: No evidence of thrombus. Normal compressibility and flow on color Doppler imaging. Profunda Femoral Vein: No evidence of thrombus. Normal compressibility and flow on color Doppler imaging. Femoral Vein: No evidence of thrombus. Normal compressibility, respiratory phasicity and response to augmentation. Popliteal Vein: No evidence of thrombus. Normal compressibility, respiratory phasicity and response to augmentation. Calf Veins: No evidence of thrombus. Normal compressibility and flow on color Doppler imaging. Superficial Great Saphenous Vein: No evidence of thrombus. Normal compressibility and flow on color Doppler imaging. Venous Reflux:  None. Other Findings: Left popliteal fossa minimally complex thick-walled Bakers cyst measures 4.5 x 3.6 cm IMPRESSION: No  evidence of DVT within either lower extremity. 4.5 cm left Bakers cyst. Electronically Signed   By: Jerilynn Mages.  Shick M.D.   On: 02/09/2017 13:21   Dg Chest Port 1 View  Result Date: 02/10/2017 CLINICAL DATA:  Shortness of Breath EXAM: PORTABLE CHEST 1 VIEW COMPARISON:  February 09, 2017 FINDINGS: There is left base atelectasis with small left pleural effusion. Lungs elsewhere are clear. Heart is upper normal in size with pulmonary vascularity within normal limits. No adenopathy. Patient is status post median sternotomy. No evident bone lesions. No appreciable pneumothorax. IMPRESSION: Mild left base atelectasis with small left pleural effusion. Lungs elsewhere are clear. Stable cardiac silhouette. Electronically Signed   By: Lowella Grip III M.D.   On: 02/10/2017 07:57   Dg Chest Port 1 View  Result Date: 02/09/2017 CLINICAL DATA:  Shortness of breath, hypertension, history of coronary artery disease and CABG,  diabetes, CHF, former smoker. EXAM: PORTABLE CHEST 1 VIEW COMPARISON:  Chest x-ray of February 02, 2017 FINDINGS: The lungs are adequately inflated. The interstitial markings are slightly more conspicuous today. The cardiac silhouette remains enlarged. The central pulmonary vascularity is mildly prominent. Patient has undergone previous median sternotomy. IMPRESSION: The findings suggest low-grade CHF with very mild interstitial edema. There is no alveolar pneumonia. Electronically Signed   By: David  Martinique M.D.   On: 02/09/2017 12:07    Cardiac Studies   Echocardiogram 02/04/2017: Study Conclusions   - Left ventricle: The cavity size was normal. Wall thickness was   increased in a pattern of mild LVH. The estimated ejection   fraction was 55%. The study is not technically sufficient to   allow evaluation of LV diastolic function. - Aortic valve: Mildly calcified annulus. Trileaflet; mildly   calcified leaflets. - Mitral valve: There was mild regurgitation. - Left atrium: The atrium was at the upper limits of normal in   size. - Right ventricle: Systolic function was mildly reduced. - Right atrium: Central venous pressure (est): 3 mm Hg. - Tricuspid valve: There was mild regurgitation. - Pulmonary arteries: PA peak pressure: 39 mm Hg (S). - Pericardium, extracardiac: There was no pericardial effusion.   Impressions:   - Mild LVH with LVEF approximately 55% based on imaging in the   latter portions of the study when heart rate had decreased.   Diastolic function is indeterminate in the setting of atrial   flutter. Upper normal left atrial chamber size. Mild mitral   regurgitation. Mildly sclerotic aortic valve. Mildly reduced   right ventricular contraction. Mild tricuspid regurgitation with   PASP estimated 39 mmHg.     Patient Profile     83 y.o. male admitted from the office with atrial fibrillation with uncontrolled rate, CHADSVASC 6, hadn't been anticoagulated b/c of fall  risk but needs for DCCV, and hypoxia was found to have acute pulmonary embolus.  Assessment & Plan    1. Atrial fibrillation/flutter with variable heart rate control on Lopressor and oral amiodarone load which was initiated on September 20. CHADSVASC score is 6. Dr. Myles Gip recommendation would be to initiate oral anticoagulation with a plan to ultimately pursue cardioversion, particularly if he does not convert on amiodarone. Patient's HR 140 this am, not on O2. Will add O2 and Increase metoprolol 25 mg BID- will give dose now. If HR doesn't slow down will give IV.    2. Acute Pulmonary Embolus on IV heparin   3. Acute on chronic diastolic heart failure complicated by atrial arrhythmia and urinary retention. Will likely need  IV diuretics during hospital stay, adjusted based on urine output and degree of renal insufficiency   4. CAD status post CABG, no active angina symptoms at this time.   5. Essential hypertension, blood pressure is currently controlled.   6. Hyperlipidemia with statin intolerance.   7. Recurring urinary retention, currently has Foley catheter in place. Likely need further input from Urology service.     For questions or updates, please contact [place] Please consult [email or web address] for contact info under Cardiology/STEMI.      Signed, Ermalinda Barrios, PA-C  02/10/2017, 8:09 AM    The patient was seen and examined, and I agree with the history, physical exam, assessment and plan as documented above, with modifications as noted below. I have also personally reviewed all relevant documentation, old records, labs, and both radiographic and cardiovascular studies. I have also independently interpreted old and new ECG's.  83 yr old gentleman evaluated in the office by Dr. Domenic Polite on 10/2 who recommended hospitalization, and subsequently found to have bilateral upper lobe pulmonary emboli by VQ scan.  He also has rapid atrial atrial flutter with variable  conduction and consequent acute diastolic heart failure.  From a CAD standpoint, he is stable. He is currently on ASA and is statin intolerant.  Daughter also present in room during my evaluation.  Recommendations: 1. Acute bilateral upper lobe pulmonary emboli: Currently on IV heparin and warfarin has been ordered. GFR currently 39, had been 49 over a week ago. He is on IV Lasix dosed bid which is also contributing to rising creatinine.  I will not change for now but one could consider switching to Xarelto for feasibility as renal function is permissible.  2. Rapid atrial flutter and fibrillation: HR's elevated, being driven by PE and diastolic heart failure. Will increase metoprolol for more optimal HR control and continue amiodarone. Can decrease antihypertensive therapy if need be to allow for advancement of beta blockers. Currently on IV heparin and warfarin for anticoagulation, but could be switched to Xarelto for reasons mentioned above.  3. Acute diastolic heart failure: Likely secondary to both PE and rapid atrial flutter/fibrillation. Aim to control HR by increasing metoprolol. Chest xray does not show pulmonary edema today but he does have lower extremity edema. Has put out nearly 3.5 L. Given decline in GFR, I will reduce Lasix frequency to 40 mg daily.  4. CAD: Symptomatically stable. Statin intolerant. I will reduce ASA to 81 mg daily as he is now on IV heparin and warfarin, so as to decrease bleeding risk.   Kate Sable, MD, [facility]  02/10/2017 9:46 AM

## 2017-02-10 NOTE — Progress Notes (Signed)
ANTICOAGULATION CONSULT NOTE - Follow Up Consult  Pharmacy Consult for Heparin Indication: Pulmonary embolism  Allergies  Allergen Reactions  . Ezetimibe Other (See Comments)    Reaction:muscle cramps  . Statins Other (See Comments)    Reaction:muscle cramps    Patient Measurements: Height: 5' 10.5" (179.1 cm) Weight: 196 lb 9.6 oz (89.2 kg) IBW/kg (Calculated) : 74.15 Heparin Dosing Weight: 89.2 kg  Vital Signs: Temp: 97.8 F (36.6 C) (10/02 2140) Temp Source: Oral (10/02 2140) BP: 108/58 (10/02 2140) Pulse Rate: 72 (10/02 2140)  Labs:  Recent Labs  02/09/17 1152 02/09/17 1203 02/09/17 2208  HGB 13.3  --   --   HCT 40.3  --   --   PLT 172  --   --   APTT  --  30  --   LABPROT  --  13.3  --   INR  --  1.02  --   HEPARINUNFRC  --   --  1.04*  CREATININE 1.42*  --   --   CKTOTAL 52  --   --     Estimated Creatinine Clearance: 47.1 mL/min (A) (by C-G formula based on SCr of 1.42 mg/dL (H)).   Medications:   Assessment: Heparin infusing at 1400 units/hr with initial heparin level result at 1.04 units/ml.  Goal of Therapy:  Heparin goal level 0.3-0.7 units/ml Monitor platelets by anticoagulation protocol: Yes   Plan:  Hold heparin infusion x 1 hour Restart infusion at 1200 unit/hr Recheck heparin level in 6-8 hrs. Continue to monitor H&H and platelets  Norberto Sorenson 02/10/2017,5:15 AM

## 2017-02-10 NOTE — Progress Notes (Signed)
Hold Heparin for an hour then restart at 51ml/hr per pharmacist([facility])

## 2017-02-10 NOTE — Progress Notes (Signed)
Spoke with pt's daughter and POA, Kevan Rosebush, daughter informed this RN that pt has another daughter, Janine Ores, that health information should not be shared with. Confirmed this with patient. Pt states that Velva Harman is on her way here from [place] and should arrive 10/4, pt does not want Velva Harman to know any health information about him. Will inform day shift RN of this and leave a note for MD.

## 2017-02-11 DIAGNOSIS — R627 Adult failure to thrive: Secondary | ICD-10-CM

## 2017-02-11 DIAGNOSIS — N183 Chronic kidney disease, stage 3 (moderate): Secondary | ICD-10-CM

## 2017-02-11 DIAGNOSIS — I4819 Other persistent atrial fibrillation: Secondary | ICD-10-CM

## 2017-02-11 DIAGNOSIS — N179 Acute kidney failure, unspecified: Secondary | ICD-10-CM

## 2017-02-11 DIAGNOSIS — I2782 Chronic pulmonary embolism: Secondary | ICD-10-CM

## 2017-02-11 LAB — CBC
HCT: 38.3 % — ABNORMAL LOW (ref 39.0–52.0)
Hemoglobin: 12.6 g/dL — ABNORMAL LOW (ref 13.0–17.0)
MCH: 28.6 pg (ref 26.0–34.0)
MCHC: 32.9 g/dL (ref 30.0–36.0)
MCV: 86.8 fL (ref 78.0–100.0)
PLATELETS: 190 10*3/uL (ref 150–400)
RBC: 4.41 MIL/uL (ref 4.22–5.81)
RDW: 14.7 % (ref 11.5–15.5)
WBC: 8.6 10*3/uL (ref 4.0–10.5)

## 2017-02-11 LAB — BASIC METABOLIC PANEL
Anion gap: 8 (ref 5–15)
BUN: 33 mg/dL — ABNORMAL HIGH (ref 6–20)
CO2: 28 mmol/L (ref 22–32)
Calcium: 7.9 mg/dL — ABNORMAL LOW (ref 8.9–10.3)
Chloride: 95 mmol/L — ABNORMAL LOW (ref 101–111)
Creatinine, Ser: 1.86 mg/dL — ABNORMAL HIGH (ref 0.61–1.24)
GFR calc Af Amer: 38 mL/min — ABNORMAL LOW (ref >60)
GFR calc non Af Amer: 33 mL/min — ABNORMAL LOW (ref >60)
Glucose, Bld: 202 mg/dL — ABNORMAL HIGH (ref 65–99)
Potassium: 4.7 mmol/L (ref 3.5–5.1)
Sodium: 131 mmol/L — ABNORMAL LOW (ref 135–145)

## 2017-02-11 LAB — GLUCOSE, CAPILLARY
GLUCOSE-CAPILLARY: 239 mg/dL — AB (ref 65–99)
Glucose-Capillary: 204 mg/dL — ABNORMAL HIGH (ref 65–99)
Glucose-Capillary: 322 mg/dL — ABNORMAL HIGH (ref 65–99)
Glucose-Capillary: 156 mg/dL — ABNORMAL HIGH (ref 65–99)

## 2017-02-11 LAB — MAGNESIUM: MAGNESIUM: 2.1 mg/dL (ref 1.7–2.4)

## 2017-02-11 MED ORDER — FUROSEMIDE 20 MG PO TABS
20.0000 mg | ORAL_TABLET | Freq: Every day | ORAL | Status: DC
  Administered 2017-02-12 – 2017-02-13 (×2): 20 mg via ORAL
  Filled 2017-02-11 (×2): qty 1

## 2017-02-11 NOTE — Progress Notes (Signed)
Results for CLOY, COZZENS (MRN [id number]) as of 02/11/2017 12:56  Ref. Range 02/10/2017 11:24 02/10/2017 16:20 02/10/2017 21:12 02/11/2017 07:34 02/11/2017 11:25  Glucose-Capillary Latest Ref Range: 65 - 99 mg/dL 234 (H) 227 (H) 213 (H) 204 (H) 322 (H)  Noted that blood sugars continue to be elevated.  Recommend adding Novolog 4-5 units TID with meals if eating at least 50 % of meals.  Recommend increasing Novolog correction scale to MODERATE TID & HS.   Harvel Ricks RN BSN CDE Diabetes Coordinator Pager: [phone]  8am-5pm

## 2017-02-11 NOTE — Progress Notes (Deleted)
Patient discharged home with personal belongings, prescriptions, and IV removed and site intact.  

## 2017-02-11 NOTE — Progress Notes (Signed)
Progress Note  Patient Name: Adam Peck Date of Encounter: 02/11/2017   Primary Cardiologist: Dr. Satira Sark  Subjective   Less short of breath but still not at baseline. No chest pain or palpitations. Slept better last night with sleep aid.  Inpatient Medications    Scheduled Meds: . amiodarone  200 mg Oral BID  . aspirin EC  81 mg Oral Daily  . ciprofloxacin  500 mg Oral BID  . furosemide  40 mg Intravenous Daily  . insulin aspart  0-9 Units Subcutaneous TID WC  . levothyroxine  75 mcg Oral QAC breakfast  . lubiprostone  24 mcg Oral QODAY  . metoprolol tartrate  25 mg Oral BID  . pantoprazole  40 mg Oral Q0600  . potassium chloride  40 mEq Oral BID  . risperiDONE  1 mg Oral QHS  . Rivaroxaban  15 mg Oral BID WC  . sodium chloride flush  3 mL Intravenous Q12H  . tamsulosin  0.4 mg Oral QHS   Continuous Infusions: . sodium chloride     PRN Meds: sodium chloride, acetaminophen, ondansetron (ZOFRAN) IV, sodium chloride flush   Vital Signs    Vitals:   02/10/17 1958 02/10/17 2110 02/11/17 0500 02/11/17 0535  BP:  115/84  101/63  Pulse:  77  75  Resp:  20  18  Temp:  98.4 F (36.9 C)  98.7 F (37.1 C)  TempSrc:  Oral  Oral  SpO2: 93% 97%  94%  Weight:   190 lb (86.2 kg)   Height:        Intake/Output Summary (Last 24 hours) at 02/11/17 1142 Last data filed at 02/11/17 0800  Gross per 24 hour  Intake              773 ml  Output             2000 ml  Net            -1227 ml   Filed Weights   02/09/17 1440 02/10/17 1800 02/11/17 0500  Weight: 196 lb 9.6 oz (89.2 kg) 189 lb 12.8 oz (86.1 kg) 190 lb (86.2 kg)    Telemetry    Atrial fibrillation/flutter. Personally reviewed.  Physical Exam   GEN: Chronically ill-appearing elderly male. No acute distress.   Neck: No JVD. Cardiac:  Irregularly irregular, no gallop.  Respiratory:  Decreased breath sounds without wheezing. GI: Soft, nontender, bowel sounds present. MS:  Mild ankle edema; No  deformity. Neuro:  Nonfocal. Psych: Alert and oriented x 3. Decreased hearing.  Labs    Chemistry Recent Labs Lab 02/09/17 1152 02/10/17 0459 02/11/17 0438  NA 133* 133* 131*  K 4.3 4.5 4.7  CL 96* 97* 95*  CO2 28 29 28   GLUCOSE 188* 204* 202*  BUN 28* 30* 33*  CREATININE 1.42* 1.60* 1.86*  CALCIUM 8.6* 8.2* 7.9*  PROT 5.8* 5.5*  --   ALBUMIN 2.8* 2.7*  --   AST 15 13*  --   ALT 48 40  --   ALKPHOS 45 45  --   BILITOT 0.7 1.0  --   GFRNONAA 45* 39* 33*  GFRAA 52* 45* 38*  ANIONGAP 9 7 8      Hematology Recent Labs Lab 02/09/17 1152 02/10/17 0459 02/11/17 0438  WBC 14.0* 11.5* 8.6  RBC 4.64 4.58 4.41  HGB 13.3 13.0 12.6*  HCT 40.3 39.2 38.3*  MCV 86.9 85.6 86.8  MCH 28.7 28.4 28.6  MCHC 33.0 33.2 32.9  RDW 14.6 14.7 14.7  PLT 172 199 190    BNP Recent Labs Lab 02/09/17 1152  BNP 78.0     DDimer  Recent Labs Lab 02/09/17 1152  DDIMER 5.87*     Radiology    Nm Pulmonary Perf And Vent  Result Date: 02/09/2017 CLINICAL DATA:  Short of breath. Elevated D-dimer. Concern pulmonary embolism. EXAM: NUCLEAR MEDICINE VENTILATION - PERFUSION LUNG SCAN TECHNIQUE: Ventilation images were obtained in multiple projections using inhaled aerosol Tc-21m DTPA. Perfusion images were obtained in multiple projections after intravenous injection of Tc-50m MAA. RADIOPHARMACEUTICALS:  Thirty-three mCi Technetium-1m DTPA aerosol inhalation and 3.8 mCi Technetium-53m MAA IV COMPARISON:  Chest radiograph 02/09/2017 FINDINGS: Ventilation: Central deposition of the radiotracer in the proximal bronchi. There is no peripheral perfusion ventilation defects identified. Perfusion: Several small peripheral perfusion defects are present in the upper lobes on LEFT and RIGHT nodule adn to a lesser degree lower lobes. a These small peripheral perfusion defects are not matched by ventilation defects. IMPRESSION: Several small unmatched peripheral perfusion defects in the upper lobes are  concerning for acute pulmonary embolism. High probability. These results will be called to the ordering clinician or representative by the Radiologist Assistant, and communication documented in the PACS or zVision Dashboard. Electronically Signed   By: Suzy Bouchard M.D.   On: 02/09/2017 14:29   US Venous Img Lower Bilateral  Result Date: 02/09/2017 CLINICAL DATA:  Bilateral lower extremity edema with shortness of breath for 10 days EXAM: BILATERAL LOWER EXTREMITY VENOUS DOPPLER ULTRASOUND TECHNIQUE: Gray-scale sonography with graded compression, as well as color Doppler and duplex ultrasound were performed to evaluate the lower extremity deep venous systems from the level of the common femoral vein and including the common femoral, femoral, profunda femoral, popliteal and calf veins including the posterior tibial, peroneal and gastrocnemius veins when visible. The superficial great saphenous vein was also interrogated. Spectral Doppler was utilized to evaluate flow at rest and with distal augmentation maneuvers in the common femoral, femoral and popliteal veins. COMPARISON:  None. FINDINGS: RIGHT LOWER EXTREMITY Common Femoral Vein: No evidence of thrombus. Normal compressibility, respiratory phasicity and response to augmentation. Saphenofemoral Junction: No evidence of thrombus. Normal compressibility and flow on color Doppler imaging. Profunda Femoral Vein: No evidence of thrombus. Normal compressibility and flow on color Doppler imaging. Femoral Vein: No evidence of thrombus. Normal compressibility, respiratory phasicity and response to augmentation. Popliteal Vein: No evidence of thrombus. Normal compressibility, respiratory phasicity and response to augmentation. Calf Veins: No evidence of thrombus. Normal compressibility and flow on color Doppler imaging. Superficial Great Saphenous Vein: No evidence of thrombus. Normal compressibility and flow on color Doppler imaging. Venous Reflux:  None. Other  Findings:  None. LEFT LOWER EXTREMITY Common Femoral Vein: No evidence of thrombus. Normal compressibility, respiratory phasicity and response to augmentation. Saphenofemoral Junction: No evidence of thrombus. Normal compressibility and flow on color Doppler imaging. Profunda Femoral Vein: No evidence of thrombus. Normal compressibility and flow on color Doppler imaging. Femoral Vein: No evidence of thrombus. Normal compressibility, respiratory phasicity and response to augmentation. Popliteal Vein: No evidence of thrombus. Normal compressibility, respiratory phasicity and response to augmentation. Calf Veins: No evidence of thrombus. Normal compressibility and flow on color Doppler imaging. Superficial Great Saphenous Vein: No evidence of thrombus. Normal compressibility and flow on color Doppler imaging. Venous Reflux:  None. Other Findings: Left popliteal fossa minimally complex thick-walled Bakers cyst measures 4.5 x 3.6 cm IMPRESSION: No evidence of DVT within either lower extremity. 4.5 cm left Bakers  cyst. Electronically Signed   By: Jerilynn Mages.  Shick M.D.   On: 02/09/2017 13:21   Dg Chest Port 1 View  Result Date: 02/10/2017 CLINICAL DATA:  Shortness of Breath EXAM: PORTABLE CHEST 1 VIEW COMPARISON:  February 09, 2017 FINDINGS: There is left base atelectasis with small left pleural effusion. Lungs elsewhere are clear. Heart is upper normal in size with pulmonary vascularity within normal limits. No adenopathy. Patient is status post median sternotomy. No evident bone lesions. No appreciable pneumothorax. IMPRESSION: Mild left base atelectasis with small left pleural effusion. Lungs elsewhere are clear. Stable cardiac silhouette. Electronically Signed   By: Lowella Grip III M.D.   On: 02/10/2017 07:57   Dg Chest Port 1 View  Result Date: 02/09/2017 CLINICAL DATA:  Shortness of breath, hypertension, history of coronary artery disease and CABG, diabetes, CHF, former smoker. EXAM: PORTABLE CHEST 1 VIEW  COMPARISON:  Chest x-ray of February 02, 2017 FINDINGS: The lungs are adequately inflated. The interstitial markings are slightly more conspicuous today. The cardiac silhouette remains enlarged. The central pulmonary vascularity is mildly prominent. Patient has undergone previous median sternotomy. IMPRESSION: The findings suggest low-grade CHF with very mild interstitial edema. There is no alveolar pneumonia. Electronically Signed   By: David  Martinique M.D.   On: 02/09/2017 12:07    Cardiac Studies   Echocardiogram 02/04/2017: Study Conclusions  - Left ventricle: The cavity size was normal. Wall thickness was   increased in a pattern of mild LVH. The estimated ejection   fraction was 55%. The study is not technically sufficient to   allow evaluation of LV diastolic function. - Aortic valve: Mildly calcified annulus. Trileaflet; mildly   calcified leaflets. - Mitral valve: There was mild regurgitation. - Left atrium: The atrium was at the upper limits of normal in   size. - Right ventricle: Systolic function was mildly reduced. - Right atrium: Central venous pressure (est): 3 mm Hg. - Tricuspid valve: There was mild regurgitation. - Pulmonary arteries: PA peak pressure: 39 mm Hg (S). - Pericardium, extracardiac: There was no pericardial effusion.  Impressions:  - Mild LVH with LVEF approximately 55% based on imaging in the   latter portions of the study when heart rate had decreased.   Diastolic function is indeterminate in the setting of atrial   flutter. Upper normal left atrial chamber size. Mild mitral   regurgitation. Mildly sclerotic aortic valve. Mildly reduced   right ventricular contraction. Mild tricuspid regurgitation with   PASP estimated 39 mmHg.  Patient Profile     83 y.o. male with a history of persistent atrial fibrillation/flutter, CHADSVASC score of 6, CAD status post CABG, hypertension, acute on chronic diastolic heart failure, and recently documented hypoxia  with diagnosis of acute pulmonary embolus based on high probability VQ scan.  Assessment & Plan    1. Atrial fibrillation/flutter with CHADSVASC score of 6. Heart rate control is generally better on higher dose Lopressor. He also continues on amiodarone 200 mg twice daily and has been transitioned to Xarelto with concurrent diagnosis of acute pulmonary embolus.  2. Acute pulmonary embolus based on high probability VQ scan. This would explain his recent hypoxia and also likely contributing to his atrial arrhythmia.   3. CAD status post CABG. No active angina symptoms. LVEF 55% by recent echocardiogram.  4. Progressive renal insufficiency, creatinine at 1.8.  Medications reviewed. Patient has been transitioned from IV heparin to treatment dose Xarelto for acute pulmonary embolus. From the perspective of his atrial fibrillation/flutter, would continue with  strategy of heart rate control and anticoagulation, although continue amiodarone in hopes that he will convert as pulmonary embolus is further treated. Does not look to be as urgent a need to consider an inpatient cardioversion, this can be done at a later date. Would stop IV Lasix and convert to oral diuretic. Follow-up BMET in a.m.  Signed, Rozann Lesches, MD  02/11/2017, 11:42 AM

## 2017-02-11 NOTE — Progress Notes (Signed)
Pt's daughter, Suanne Marker is POA and has asked that no information be given to sister, Janine Ores.  Patient's PASSWORD is Adam Peck.  Do not give out any information without this password.

## 2017-02-11 NOTE — Progress Notes (Addendum)
PROGRESS NOTE                                                                                                                                                                                                             Patient Demographics:    Adam Peck, is a 83 y.o. male, DOB - [birth year], PNT:[id number]  Admit date - 02/09/2017   Admitting Physician Murlean Iba, MD  Outpatient Primary MD for the patient is Burdine, Virgina Evener, MD  LOS - 2  Outpatient Specialists:Dr. Domenic Polite  No chief complaint on file.      Brief Narrative   83 year old male with newly diagnosed A. fib with RVR being treated on beta blocker and amiodarone, CAD with history of CABG in 2014, hypertension, BPH with urinary retention on indwelling Foley catheter who was seen in cardiologist office for generalized weakness and increased shortness of breath for past several days. Also has been unsteady on his feet. Patient was seen in the cardiology office with uncontrolled A. fib with fluctuating heart rate (occasionally going as high as 150s). Patient was also increasingly short of breath. Patient omitted to hospitalist service for father management of his increasing shortness of breath with generalized weakness and poorly controlled A. fib. A VQ scan done following admission showed a high probability for PE.   Subjective:   Still feels weak but shortness of breath is slightly better. Heart rate will controlled on the monitor.   Assessment  & Plan :    Principal Problem:   Pulmonary embolus (HCC) V/Q scan showing high probability for PE. Lower extremity negative for DVT. supportive care with oxygen. IV heparin and transitioned to oral Xarelto per cardiology recommendation.    Active Problems: Atrial fibrillation with RVR Likely contributed by both diastolic CHF and PE. [id number] of 6.  Now on anticoagulation. Metoprolol dose increased by  cardiology and heart rate appears better controlled. Continue amiodarone.     Hypothyroidism TSH normal. Continue Synthroid.  Acute diastolic CHF. Getting IV Lasix with good diuresis. (-4.3 L since admission)  Renal function worsened further in and lab. May need to tediously 6 kg further (will defer to cardiology).   Coronary artery disease Asymptomatic. Aspirin dose reduced 81 mg daily since he is on anticoagulation. Continue beta blocker.  Acute on Chronic kidney disease stage III Lasix dose reduced due  to mildly worsened renal function. Hold losartan.  Avoid nephrotoxins.    Diabetes mellitus type 2 uncontrolled A1c of 8.7. CBC stable. Monitor on sliding scale coverage.  Generalized weakness/failure to thrive. PT evaluation. Patient does not want to go to SNF.   BPH with  urinary retention Has chronic indwelling Foley. Continue Flomax.  Hypotension Blood pressure soft this afternoon with systolic blood pressure in 90s. Patient is on IV Lasix, losartan, metoprolol and amlodipine. Amlodipine held, will hold losartan today given worsened renal fn.  Enterobacter UTI Was getting bactrim as outpt since 9/29. Switched to cipro on 10/2.  Hyperlipidemia Intolerant to statin and Zetia.  Hypomagnesemia Replenished  Chronic constipation Seen by GI as outpatient on 10/2. Continue amitza.    Code Status : Full code  Family Communication  : Daughter at bedside  Disposition Plan  : Pending clinical improvement/PT evaluation  Barriers For Discharge : Active symptoms  Consults  : Cardiology  Procedures  :  VQ scan neck slight Doppler lower extremity  DVT Prophylaxis  :  Xarelto  Lab Results  Component Value Date   PLT 190 02/11/2017    Antibiotics  :    Anti-infectives    Start     Dose/Rate Route Frequency Ordered Stop   02/09/17 1300  ciprofloxacin (CIPRO) tablet 500 mg     500 mg Oral 2 times daily 02/09/17 1240          Objective:   Vitals:   02/10/17  1958 02/10/17 2110 02/11/17 0500 02/11/17 0535  BP:  115/84  101/63  Pulse:  77  75  Resp:  20  18  Temp:  98.4 F (36.9 C)  98.7 F (37.1 C)  TempSrc:  Oral  Oral  SpO2: 93% 97%  94%  Weight:   86.2 kg (190 lb)   Height:        Wt Readings from Last 3 Encounters:  02/11/17 86.2 kg (190 lb)  02/09/17 90.8 kg (200 lb 3.2 oz)  02/09/17 90.3 kg (199 lb)     Intake/Output Summary (Last 24 hours) at 02/11/17 0921 Last data filed at 02/11/17 0800  Gross per 24 hour  Intake              799 ml  Output             2000 ml  Net            -1201 ml     Physical Exam Gen.: Elderly male appears fatigued, not in distress HEENT: Moist mucosa, supple neck Chest: Improvement sounds over lung bases CVS: S1-S2 is regular, no murmur, gallop GI: Soft, nondistended, nontender, bowel sounds present Musculoskeletal: Warm, 1+ pitting edema bilaterally     Data Review:    CBC  Recent Labs Lab 02/09/17 1152 02/10/17 0459 02/11/17 0438  WBC 14.0* 11.5* 8.6  HGB 13.3 13.0 12.6*  HCT 40.3 39.2 38.3*  PLT 172 199 190  MCV 86.9 85.6 86.8  MCH 28.7 28.4 28.6  MCHC 33.0 33.2 32.9  RDW 14.6 14.7 14.7  LYMPHSABS 0.6*  --   --   MONOABS 0.8  --   --   EOSABS 0.3  --   --   BASOSABS 0.0  --   --     Chemistries   Recent Labs Lab 02/09/17 1152 02/10/17 0459 02/11/17 0438  NA 133* 133* 131*  K 4.3 4.5 4.7  CL 96* 97* 95*  CO2 28 29 28   GLUCOSE 188* 204* 202*  BUN 28* 30* 33*  CREATININE 1.42* 1.60* 1.86*  CALCIUM 8.6* 8.2* 7.9*  MG 1.7 1.4* 2.1  AST 15 13*  --   ALT 48 40  --   ALKPHOS 45 45  --   BILITOT 0.7 1.0  --    ------------------------------------------------------------------------------------------------------------------  Recent Labs  02/10/17 0459  CHOL 160  HDL 50  LDLCALC 93  TRIG 86  CHOLHDL 3.2    Lab Results  Component Value Date   HGBA1C 8.7 (H) 02/09/2017    ------------------------------------------------------------------------------------------------------------------  Recent Labs  02/09/17 1152  TSH 2.799   ------------------------------------------------------------------------------------------------------------------ No results for input(s): VITAMINB12, FOLATE, FERRITIN, TIBC, IRON, RETICCTPCT in the last 72 hours.  Coagulation profile  Recent Labs Lab 02/09/17 1203 02/10/17 0459  INR 1.02 1.13     Recent Labs  02/09/17 1152  DDIMER 5.87*    Cardiac Enzymes No results for input(s): CKMB, TROPONINI, MYOGLOBIN in the last 168 hours.  Invalid input(s): CK ------------------------------------------------------------------------------------------------------------------    Component Value Date/Time   BNP 78.0 02/09/2017 1152    Inpatient Medications  Scheduled Meds: . amiodarone  200 mg Oral BID  . aspirin EC  81 mg Oral Daily  . ciprofloxacin  500 mg Oral BID  . furosemide  40 mg Intravenous Daily  . insulin aspart  0-9 Units Subcutaneous TID WC  . levothyroxine  75 mcg Oral QAC breakfast  . lubiprostone  24 mcg Oral QODAY  . metoprolol tartrate  25 mg Oral BID  . pantoprazole  40 mg Oral Q0600  . potassium chloride  40 mEq Oral BID  . risperiDONE  1 mg Oral QHS  . Rivaroxaban  15 mg Oral BID WC  . sodium chloride flush  3 mL Intravenous Q12H  . tamsulosin  0.4 mg Oral QHS   Continuous Infusions: . sodium chloride     PRN Meds:.sodium chloride, acetaminophen, ondansetron (ZOFRAN) IV, sodium chloride flush  Micro Results Recent Results (from the past 240 hour(s))  Urine culture     Status: Abnormal   Collection Time: 02/02/17  5:12 PM  Result Value Ref Range Status   Specimen Description URINE, CLEAN CATCH  Final   Special Requests NONE  Final   Culture >=100,000 COLONIES/mL ENTEROBACTER SPECIES (A)  Final   Report Status 02/05/2017 FINAL  Final   Organism ID, Bacteria ENTEROBACTER SPECIES (A)   Final      Susceptibility   Enterobacter species - MIC*    CEFAZOLIN >=64 RESISTANT Resistant     CEFTRIAXONE >=64 RESISTANT Resistant     CIPROFLOXACIN <=0.25 SENSITIVE Sensitive     GENTAMICIN <=1 SENSITIVE Sensitive     IMIPENEM 0.5 SENSITIVE Sensitive     NITROFURANTOIN 128 RESISTANT Resistant     TRIMETH/SULFA <=20 SENSITIVE Sensitive     PIP/TAZO 64 INTERMEDIATE Intermediate     * >=100,000 COLONIES/mL ENTEROBACTER SPECIES    Radiology Reports Dg Chest 2 View  Result Date: 02/02/2017 CLINICAL DATA:  Dyspnea. Heart fluttering for 1 week. History of atrial fibrillation. EXAM: CHEST  2 VIEW COMPARISON:  10/18/2012 radiographs. FINDINGS: The heart size and mediastinal contours are stable status post median sternotomy and CABG. Left basilar scarring or atelectasis is similar to the prior study. There is no edema, confluent airspace opacity, pleural effusion or pneumothorax. No acute osseous findings are seen. IMPRESSION: Stable postoperative chest.  No acute cardiopulmonary process. Electronically Signed   By: Richardean Sale M.D.   On: 02/02/2017 16:50   Nm Pulmonary Perf And Vent  Result Date:  02/09/2017 CLINICAL DATA:  Short of breath. Elevated D-dimer. Concern pulmonary embolism. EXAM: NUCLEAR MEDICINE VENTILATION - PERFUSION LUNG SCAN TECHNIQUE: Ventilation images were obtained in multiple projections using inhaled aerosol Tc-35m DTPA. Perfusion images were obtained in multiple projections after intravenous injection of Tc-60m MAA. RADIOPHARMACEUTICALS:  Thirty-three mCi Technetium-52m DTPA aerosol inhalation and 3.8 mCi Technetium-32m MAA IV COMPARISON:  Chest radiograph 02/09/2017 FINDINGS: Ventilation: Central deposition of the radiotracer in the proximal bronchi. There is no peripheral perfusion ventilation defects identified. Perfusion: Several small peripheral perfusion defects are present in the upper lobes on LEFT and RIGHT nodule adn to a lesser degree lower lobes. a These small  peripheral perfusion defects are not matched by ventilation defects. IMPRESSION: Several small unmatched peripheral perfusion defects in the upper lobes are concerning for acute pulmonary embolism. High probability. These results will be called to the ordering clinician or representative by the Radiologist Assistant, and communication documented in the PACS or zVision Dashboard. Electronically Signed   By: Suzy Bouchard M.D.   On: 02/09/2017 14:29   US Venous Img Lower Bilateral  Result Date: 02/09/2017 CLINICAL DATA:  Bilateral lower extremity edema with shortness of breath for 10 days EXAM: BILATERAL LOWER EXTREMITY VENOUS DOPPLER ULTRASOUND TECHNIQUE: Gray-scale sonography with graded compression, as well as color Doppler and duplex ultrasound were performed to evaluate the lower extremity deep venous systems from the level of the common femoral vein and including the common femoral, femoral, profunda femoral, popliteal and calf veins including the posterior tibial, peroneal and gastrocnemius veins when visible. The superficial great saphenous vein was also interrogated. Spectral Doppler was utilized to evaluate flow at rest and with distal augmentation maneuvers in the common femoral, femoral and popliteal veins. COMPARISON:  None. FINDINGS: RIGHT LOWER EXTREMITY Common Femoral Vein: No evidence of thrombus. Normal compressibility, respiratory phasicity and response to augmentation. Saphenofemoral Junction: No evidence of thrombus. Normal compressibility and flow on color Doppler imaging. Profunda Femoral Vein: No evidence of thrombus. Normal compressibility and flow on color Doppler imaging. Femoral Vein: No evidence of thrombus. Normal compressibility, respiratory phasicity and response to augmentation. Popliteal Vein: No evidence of thrombus. Normal compressibility, respiratory phasicity and response to augmentation. Calf Veins: No evidence of thrombus. Normal compressibility and flow on color Doppler  imaging. Superficial Great Saphenous Vein: No evidence of thrombus. Normal compressibility and flow on color Doppler imaging. Venous Reflux:  None. Other Findings:  None. LEFT LOWER EXTREMITY Common Femoral Vein: No evidence of thrombus. Normal compressibility, respiratory phasicity and response to augmentation. Saphenofemoral Junction: No evidence of thrombus. Normal compressibility and flow on color Doppler imaging. Profunda Femoral Vein: No evidence of thrombus. Normal compressibility and flow on color Doppler imaging. Femoral Vein: No evidence of thrombus. Normal compressibility, respiratory phasicity and response to augmentation. Popliteal Vein: No evidence of thrombus. Normal compressibility, respiratory phasicity and response to augmentation. Calf Veins: No evidence of thrombus. Normal compressibility and flow on color Doppler imaging. Superficial Great Saphenous Vein: No evidence of thrombus. Normal compressibility and flow on color Doppler imaging. Venous Reflux:  None. Other Findings: Left popliteal fossa minimally complex thick-walled Bakers cyst measures 4.5 x 3.6 cm IMPRESSION: No evidence of DVT within either lower extremity. 4.5 cm left Bakers cyst. Electronically Signed   By: Jerilynn Mages.  Shick M.D.   On: 02/09/2017 13:21   Dg Chest Port 1 View  Result Date: 02/10/2017 CLINICAL DATA:  Shortness of Breath EXAM: PORTABLE CHEST 1 VIEW COMPARISON:  February 09, 2017 FINDINGS: There is left base atelectasis with small left  pleural effusion. Lungs elsewhere are clear. Heart is upper normal in size with pulmonary vascularity within normal limits. No adenopathy. Patient is status post median sternotomy. No evident bone lesions. No appreciable pneumothorax. IMPRESSION: Mild left base atelectasis with small left pleural effusion. Lungs elsewhere are clear. Stable cardiac silhouette. Electronically Signed   By: Lowella Grip III M.D.   On: 02/10/2017 07:57   Dg Chest Port 1 View  Result Date:  02/09/2017 CLINICAL DATA:  Shortness of breath, hypertension, history of coronary artery disease and CABG, diabetes, CHF, former smoker. EXAM: PORTABLE CHEST 1 VIEW COMPARISON:  Chest x-ray of February 02, 2017 FINDINGS: The lungs are adequately inflated. The interstitial markings are slightly more conspicuous today. The cardiac silhouette remains enlarged. The central pulmonary vascularity is mildly prominent. Patient has undergone previous median sternotomy. IMPRESSION: The findings suggest low-grade CHF with very mild interstitial edema. There is no alveolar pneumonia. Electronically Signed   By: David  Martinique M.D.   On: 02/09/2017 12:07    Time Spent in minutes  35   Louellen Molder M.D on 02/11/2017 at 9:21 AM  Between 7am to 7pm - Pager - [phone]  After 7pm go to [email or web address] - password [facility]  [facility] -  Office  [phone]

## 2017-02-11 NOTE — Evaluation (Signed)
Physical Therapy Evaluation Patient Details Name: Adam Peck MRN: [id number] DOB: 10-Sep-1933 Today's Date: 02/11/2017   History of Present Illness   Adam Peck is a 83 y.o. male with history of paroxysmal atrial fibrillation, congestive heart failure, coronary artery disease and other medical history detailed below presented as a direct admission to the hospital from his cardiologist's office today. The patient has been having progressive symptoms of weakness and shortness of breath over the past several days. His atrial fibrillation has been difficult to control. He has been managed outpatient with oral medications but continues to have fluctuating episodes of tachycardia and bradycardia. The patient presented today with edema in the legs and some acute diastolic heart failure. Dr. Domenic Polite was concerned about a PE given the patient's symptoms of shortness of breath and hypoxia. He also has been suffering with urinary retention and has a chronic indwelling Foley catheter. He was recently treated for an Enterobacter UTI and started medication for that yesterday. He is being admitted to the hospital for difficult to control atrial fibrillation, hypoxia, acute on chronic diastolic heart failure and progressive generalized weakness.    Clinical Impression  Patient unsteady on feet and advised to use RW when he returns home (see functional status below).  Patient will benefit from continued physical therapy in hospital and recommended venue below to increase strength, balance, endurance for safe ADLs and gait.    Follow Up Recommendations Home health PT;Supervision/Assistance - 24 hour    Equipment Recommendations  None recommended by PT    Recommendations for Other Services OT consult (OT consult for smaller shower chair that can fit in patient's shower)     Precautions / Restrictions Precautions Precautions: Fall Restrictions Weight Bearing Restrictions: No      Mobility  Bed  Mobility Overal bed mobility: Needs Assistance Bed Mobility: Supine to Sit;Sit to Supine     Supine to sit: Min assist Sit to supine: Min assist      Transfers Overall transfer level: Needs assistance Equipment used: Rolling walker (2 wheeled);1 person hand held assist Transfers: Sit to/from Stand Sit to Stand: Min assist            Ambulation/Gait Ambulation/Gait assistance: Min assist Ambulation Distance (Feet): 50 Feet Assistive device: Rolling walker (2 wheeled) Gait Pattern/deviations: Decreased step length - right;Decreased step length - left;Decreased stride length   Gait velocity interpretation: Below normal speed for age/gender General Gait Details: Patient had near fall after taking a few steps without using RW, balance improved with RW and demonstrates slow slightly labored cadence without losing balance, limited secondary to c/o fatigue  Stairs            Wheelchair Mobility    Modified Rankin (Stroke Patients Only)       Balance Overall balance assessment: Needs assistance Sitting-balance support: Feet supported;No upper extremity supported Sitting balance-Leahy Scale: Good     Standing balance support: During functional activity;Bilateral upper extremity supported Standing balance-Leahy Scale: Fair Standing balance comment: very unsteady with near fall with upper extremities unsupported                             Pertinent Vitals/Pain Pain Assessment: No/denies pain    Home Living Family/patient expects to be discharged to:: Private residence Living Arrangements: Children Available Help at Discharge: Family Type of Home: House Home Access: Stairs to enter Entrance Stairs-Rails: None Entrance Stairs-Number of Steps: 1 Home Layout: One level Home Equipment: Environmental consultant -  2 wheels;Cane - single point;Shower seat Additional Comments: Patient states his shower chair is to big to fit in his shower stall    Prior Function Level of  Independence: Independent               Hand Dominance        Extremity/Trunk Assessment   Upper Extremity Assessment Upper Extremity Assessment: Generalized weakness    Lower Extremity Assessment Lower Extremity Assessment: Generalized weakness    Cervical / Trunk Assessment Cervical / Trunk Assessment: Normal  Communication   Communication: No difficulties  Cognition Arousal/Alertness: Awake/alert Behavior During Therapy: WFL for tasks assessed/performed Overall Cognitive Status: Within Functional Limits for tasks assessed                                        General Comments      Exercises     Assessment/Plan    PT Assessment Patient needs continued PT services  PT Problem List Decreased strength;Decreased activity tolerance;Decreased balance;Decreased mobility       PT Treatment Interventions Gait training;Functional mobility training;Therapeutic activities;Therapeutic exercise;Patient/family education    PT Goals (Current goals can be found in the Care Plan section)  Acute Rehab PT Goals Patient Stated Goal: Return home with family to assist PT Goal Formulation: With patient/family Time For Goal Achievement: 02/18/17 Potential to Achieve Goals: Good    Frequency Min 3X/week   Barriers to discharge        Co-evaluation               AM-PAC PT "6 Clicks" Daily Activity  Outcome Measure Difficulty turning over in bed (including adjusting bedclothes, sheets and blankets)?: A Little Difficulty moving from lying on back to sitting on the side of the bed? : A Lot Difficulty sitting down on and standing up from a chair with arms (e.g., wheelchair, bedside commode, etc,.)?: A Little Help needed moving to and from a bed to chair (including a wheelchair)?: A Little Help needed walking in hospital room?: A Little Help needed climbing 3-5 steps with a railing? : A Lot 6 Click Score: 16    End of Session Equipment Utilized  During Treatment: Gait belt Activity Tolerance: Patient tolerated treatment well Patient left: in bed;with call bell/phone within reach;with nursing/sitter in room (seated at bedside to take medications with RN) Nurse Communication: Mobility status PT Visit Diagnosis: Unsteadiness on feet (R26.81);Other abnormalities of gait and mobility (R26.89);Muscle weakness (generalized) (M62.81)    Time: 5462-7035 PT Time Calculation (min) (ACUTE ONLY): 27 min   Charges:     PT Treatments $Therapeutic Activity: 23-37 mins   PT G Codes:        11:54 AM, 2017/02/14 Lonell Grandchild, MPT Physical Therapist with [facility] [phone] office [phone] mobile phone

## 2017-02-12 DIAGNOSIS — I25708 Atherosclerosis of coronary artery bypass graft(s), unspecified, with other forms of angina pectoris: Secondary | ICD-10-CM

## 2017-02-12 DIAGNOSIS — I2699 Other pulmonary embolism without acute cor pulmonale: Secondary | ICD-10-CM | POA: Diagnosis present

## 2017-02-12 DIAGNOSIS — I2609 Other pulmonary embolism with acute cor pulmonale: Secondary | ICD-10-CM

## 2017-02-12 DIAGNOSIS — N182 Chronic kidney disease, stage 2 (mild): Secondary | ICD-10-CM

## 2017-02-12 DIAGNOSIS — N179 Acute kidney failure, unspecified: Secondary | ICD-10-CM | POA: Diagnosis present

## 2017-02-12 DIAGNOSIS — I4891 Unspecified atrial fibrillation: Secondary | ICD-10-CM

## 2017-02-12 LAB — BASIC METABOLIC PANEL
Anion gap: 9 (ref 5–15)
BUN: 32 mg/dL — AB (ref 6–20)
CALCIUM: 7.9 mg/dL — AB (ref 8.9–10.3)
CHLORIDE: 95 mmol/L — AB (ref 101–111)
CO2: 27 mmol/L (ref 22–32)
CREATININE: 1.82 mg/dL — AB (ref 0.61–1.24)
GFR calc Af Amer: 39 mL/min — ABNORMAL LOW (ref >60)
GFR calc non Af Amer: 33 mL/min — ABNORMAL LOW (ref >60)
Glucose, Bld: 194 mg/dL — ABNORMAL HIGH (ref 65–99)
Potassium: 4.6 mmol/L (ref 3.5–5.1)
SODIUM: 131 mmol/L — AB (ref 135–145)

## 2017-02-12 LAB — GLUCOSE, CAPILLARY
GLUCOSE-CAPILLARY: 140 mg/dL — AB (ref 65–99)
GLUCOSE-CAPILLARY: 188 mg/dL — AB (ref 65–99)
Glucose-Capillary: 189 mg/dL — ABNORMAL HIGH (ref 65–99)
Glucose-Capillary: 266 mg/dL — ABNORMAL HIGH (ref 65–99)

## 2017-02-12 LAB — CBC
HCT: 38.6 % — ABNORMAL LOW (ref 39.0–52.0)
HEMOGLOBIN: 12.6 g/dL — AB (ref 13.0–17.0)
MCH: 28.6 pg (ref 26.0–34.0)
MCHC: 32.6 g/dL (ref 30.0–36.0)
MCV: 87.5 fL (ref 78.0–100.0)
Platelets: 223 10*3/uL (ref 150–400)
RBC: 4.41 MIL/uL (ref 4.22–5.81)
RDW: 14.7 % (ref 11.5–15.5)
WBC: 7.7 10*3/uL (ref 4.0–10.5)

## 2017-02-12 MED ORDER — ZOLPIDEM TARTRATE 5 MG PO TABS
5.0000 mg | ORAL_TABLET | Freq: Every evening | ORAL | Status: DC | PRN
  Administered 2017-02-12: 5 mg via ORAL
  Filled 2017-02-12: qty 1

## 2017-02-12 MED ORDER — INSULIN ASPART 100 UNIT/ML ~~LOC~~ SOLN
3.0000 [IU] | Freq: Three times a day (TID) | SUBCUTANEOUS | Status: DC
  Administered 2017-02-12 – 2017-02-13 (×3): 3 [IU] via SUBCUTANEOUS

## 2017-02-12 MED ORDER — INSULIN GLARGINE 100 UNIT/ML ~~LOC~~ SOLN
8.0000 [IU] | Freq: Every day | SUBCUTANEOUS | Status: DC
  Administered 2017-02-12: 8 [IU] via SUBCUTANEOUS
  Filled 2017-02-12 (×3): qty 0.08

## 2017-02-12 NOTE — Progress Notes (Signed)
Physical Therapy Treatment Patient Details Name: Adam Peck MRN: [id number] DOB: [birth year] Today's Date: 02/12/2017    History of Present Illness  Adam Peck is a 83 y.o. male with history of paroxysmal atrial fibrillation, congestive heart failure, coronary artery disease and other medical history detailed below presented as a direct admission to the hospital from his cardiologist's office today. The patient has been having progressive symptoms of weakness and shortness of breath over the past several days. His atrial fibrillation has been difficult to control. He has been managed outpatient with oral medications but continues to have fluctuating episodes of tachycardia and bradycardia. The patient presented today with edema in the legs and some acute diastolic heart failure. Dr. Domenic Polite was concerned about a PE given the patient's symptoms of shortness of breath and hypoxia. He also has been suffering with urinary retention and has a chronic indwelling Foley catheter. He was recently treated for an Enterobacter UTI and started medication for that yesterday. He is being admitted to the hospital for difficult to control atrial fibrillation, hypoxia, acute on chronic diastolic heart failure and progressive generalized weakness.    PT Comments    Patient demonstrates increased endurance for gait and requires less assistance for bed mobility/transfers.  Oxygen sats at 93% on room air prior to treatment and 93-94% on room after ambulation.  Patient put back on 2 LPM O2 and able to sit in chair to eat lunch.  Patient will benefit from continued physical therapy in hospital and recommended venue below to increase strength, balance, endurance for safe ADLs and gait.    Follow Up Recommendations  Home health PT;Supervision/Assistance - 24 hour     Equipment Recommendations  None recommended by PT    Recommendations for Other Services       Precautions / Restrictions Precautions Precautions:  Fall Restrictions Weight Bearing Restrictions: No    Mobility  Bed Mobility Overal bed mobility: Needs Assistance Bed Mobility: Supine to Sit;Sit to Supine     Supine to sit: Supervision Sit to supine: Supervision   General bed mobility comments: Requires less assistance for sitting up  Transfers Overall transfer level: Needs assistance Equipment used: Rolling walker (2 wheeled) Transfers: Sit to/from [employer] Transfers Sit to Stand: Supervision         General transfer comment: less assistance for sit to stands  Ambulation/Gait Ambulation/Gait assistance: Supervision Ambulation Distance (Feet): 80 Feet Assistive device: Rolling walker (2 wheeled) Gait Pattern/deviations: Decreased step length - right;Decreased step length - left;Decreased stride length   Gait velocity interpretation: Below normal speed for age/gender General Gait Details: Patient demonstrates increased endurance for gait training without loss of balance, demonstrated slightly labored slower than normal cadence without loss of balance   Stairs            Wheelchair Mobility    Modified Rankin (Stroke Patients Only)       Balance Overall balance assessment: Needs assistance Sitting-balance support: Feet supported;No upper extremity supported Sitting balance-Leahy Scale: Good     Standing balance support: During functional activity;Bilateral upper extremity supported Standing balance-Leahy Scale: Fair                              Cognition Arousal/Alertness: Awake/alert Behavior During Therapy: WFL for tasks assessed/performed Overall Cognitive Status: Within Functional Limits for tasks assessed  Exercises General Exercises - Lower Extremity Ankle Circles/Pumps: Seated;AROM;Strengthening;Both;10 reps Long Arc Quad: Seated;AROM;Strengthening;Both;10 reps Hip Flexion/Marching: Seated;AROM;Both;10  reps;Strengthening    General Comments        Pertinent Vitals/Pain Pain Assessment: No/denies pain    Home Living                      Prior Function            PT Goals (current goals can now be found in the care plan section) Acute Rehab PT Goals Patient Stated Goal: Return home with family to assist PT Goal Formulation: With patient/family Time For Goal Achievement: 02/18/17 Potential to Achieve Goals: Good Progress towards PT goals: Progressing toward goals    Frequency    Min 3X/week      PT Plan Current plan remains appropriate    Co-evaluation              AM-PAC PT "6 Clicks" Daily Activity  Outcome Measure  Difficulty turning over in bed (including adjusting bedclothes, sheets and blankets)?: None Difficulty moving from lying on back to sitting on the side of the bed? : None Difficulty sitting down on and standing up from a chair with arms (e.g., wheelchair, bedside commode, etc,.)?: None Help needed moving to and from a bed to chair (including a wheelchair)?: A Little Help needed walking in hospital room?: A Little Help needed climbing 3-5 steps with a railing? : Total 6 Click Score: 19    End of Session Equipment Utilized During Treatment: Gait belt Activity Tolerance: Patient tolerated treatment well Patient left: in chair;with call bell/phone within reach Nurse Communication: Mobility status PT Visit Diagnosis: Unsteadiness on feet (R26.81);Other abnormalities of gait and mobility (R26.89);Muscle weakness (generalized) (M62.81)     Time: 7371-0626 PT Time Calculation (min) (ACUTE ONLY): 27 min  Charges:  $Therapeutic Activity: 23-37 mins                    G Codes:      1:38 PM, 2017/03/03 Lonell Grandchild, MPT Physical Therapist with [facility] [phone] office [phone] mobile phone

## 2017-02-12 NOTE — Progress Notes (Addendum)
PROGRESS NOTE                                                                                                                                                                                                             Patient Demographics:    Adam Peck, is a 83 y.o. male, DOB - 1933/11/08, XTA:[id number]  Admit date - 02/09/2017   Admitting Physician Murlean Iba, MD  Outpatient Primary MD for the patient is Burdine, Virgina Evener, MD  LOS - 3  Outpatient Specialists:Dr. Domenic Polite  No chief complaint on file.      Brief Narrative   83 year old male with newly diagnosed A. fib with RVR being treated on beta blocker and amiodarone, CAD with history of CABG in 2014, hypertension, BPH with urinary retention on indwelling Foley catheter who was seen in cardiologist office for generalized weakness and increased shortness of breath for past several days. Also has been unsteady on his feet. Patient was seen in the cardiology office with uncontrolled A. fib with fluctuating heart rate (occasionally going as high as 150s). Patient was also increasingly short of breath. Patient omitted to hospitalist service for father management of his increasing shortness of breath with generalized weakness and poorly controlled A. fib. A VQ scan done following admission showed a high probability for PE.   Subjective:   Still feels weak but shortness of breath is slightly better. Heart rate will controlled on the monitor.   Assessment  & Plan :    Principal Problem:   Pulmonary embolus (HCC) V/Q scan showing high probability for PE. Lower extremity negative for DVT. supportive care with oxygen. IV heparin and transitioned to oral Xarelto per cardiology recommendation.    Active Problems: Atrial fibrillation with RVR Likely contributed by both diastolic CHF and PE. [id number] of 6.  Now on anticoagulation. Metoprolol dose increased by  cardiology and heart rate better controlled. Continue amiodarone.   Acute on Chronic kidney disease stage III (baseline creatinine of 1.3) Lasix dose reduced due to worsened renal function. Losartan held.  Monitor renal function in a.m. Avoid nephrotoxins.   Acute diastolic CHF. Getting IV Lasix with good diuresis. (-5.6 L since admission)  Renal function worsened further in and lab. Lasix reduced to low oral dose (20 mg daily). Continue to monitor I/O.     Hypothyroidism TSH normal. Continue  Synthroid.    Coronary artery disease Asymptomatic. Aspirin dose reduced 81 mg daily since he is on anticoagulation. Continue beta blocker.   Diabetes mellitus type 2 uncontrolled with diabetic nephropathy. A1c of 8.7. He is on metformin at home which is held (should be discontinued upon discharge given his renal function). CBG in 200s.  added bedtime Lantus and low-dose premeal  aspart. Patient will  benefit from low dose daily Lantus at home. (daughter Suanne Marker tells me she was a Marine scientist and a  diabetic coordinator and has no issue administering  insulin and monitoring glucose for him)   Generalized weakness/failure to thrive. PT recommends home health with supervision.   BPH with  urinary retention Has chronic indwelling Foley. Continue Flomax.  Hypotension on 10/3. Systolic blood pressure in the 90s. Held amlodipine, losartan held for AKI and Lasix dose reduced. Currently stable.   Enterobacter UTI Was getting bactrim as outpt since 9/29. Switched to cipro on 10/2. (Complete 7 day course of antibiotics today).  Hyperlipidemia Intolerant to statin and Zetia.  Hypomagnesemia Replenished  Chronic constipation Seen by GI as outpatient on 10/2. Continue amitza.    Code Status : Full code  Family Communication  : Daughter Suanne Marker is the POA updated on the form.. Patient has another daughter by the name of Velva Harman who lives in [place] and was visiting him. Patient and his daughter do  not want his medical issues to be discussed with [place].   Disposition Plan  : Possibly in the next 24-48 hours if shortness of breath and renal function improved.   Barriers For Discharge : Active symptoms  Consults  : Cardiology  Procedures  :  VQ scan   Doppler lower extremity  DVT Prophylaxis  :  Xarelto  Lab Results  Component Value Date   PLT 223 02/12/2017    Antibiotics  :    Anti-infectives    Start     Dose/Rate Route Frequency Ordered Stop   02/09/17 1300  ciprofloxacin (CIPRO) tablet 500 mg     500 mg Oral 2 times daily 02/09/17 1240          Objective:   Vitals:   02/11/17 1400 02/11/17 1949 02/11/17 2336 02/12/17 0656  BP: (!) 112/58  (!) 96/51 (!) 102/59  Pulse: 60  (!) 58 (!) 56  Resp: 18  19 16   Temp: 97.8 F (36.6 C)  97.8 F (36.6 C) 97.8 F (36.6 C)  TempSrc: Oral  Oral Oral  SpO2: 98% 96% 97% 94%  Weight:    83.8 kg (184 lb 12.8 oz)  Height:        Wt Readings from Last 3 Encounters:  02/12/17 83.8 kg (184 lb 12.8 oz)  02/09/17 90.8 kg (200 lb 3.2 oz)  02/09/17 90.3 kg (199 lb)     Intake/Output Summary (Last 24 hours) at 02/12/17 1005 Last data filed at 02/12/17 0900  Gross per 24 hour  Intake              720 ml  Output             2050 ml  Net            -1330 ml     Physical Exam Gen.: Elderly male appears very fatigued, not in distress HEENT: No pallor, moist mucosa, supple neck Chest: Fine bibasilar crackles, no rhonchi or wheeze CVS: S1 and S2 irregular, no murmurs rub or gallop GI: Soft, nondistended, nontender, bowel sounds present, chronic indwelling Foley. Musculoskeletal:  Warm, trace pitting edema bilaterally (improved from yesterday)      Data Review:    CBC  Recent Labs Lab 02/09/17 1152 02/10/17 0459 02/11/17 0438 02/12/17 0435  WBC 14.0* 11.5* 8.6 7.7  HGB 13.3 13.0 12.6* 12.6*  HCT 40.3 39.2 38.3* 38.6*  PLT 172 199 190 223  MCV 86.9 85.6 86.8 87.5  MCH 28.7 28.4 28.6 28.6  MCHC 33.0 33.2  32.9 32.6  RDW 14.6 14.7 14.7 14.7  LYMPHSABS 0.6*  --   --   --   MONOABS 0.8  --   --   --   EOSABS 0.3  --   --   --   BASOSABS 0.0  --   --   --     Chemistries   Recent Labs Lab 02/09/17 1152 02/10/17 0459 02/11/17 0438 02/12/17 0435  NA 133* 133* 131* 131*  K 4.3 4.5 4.7 4.6  CL 96* 97* 95* 95*  CO2 28 29 28 27   GLUCOSE 188* 204* 202* 194*  BUN 28* 30* 33* 32*  CREATININE 1.42* 1.60* 1.86* 1.82*  CALCIUM 8.6* 8.2* 7.9* 7.9*  MG 1.7 1.4* 2.1  --   AST 15 13*  --   --   ALT 48 40  --   --   ALKPHOS 45 45  --   --   BILITOT 0.7 1.0  --   --    ------------------------------------------------------------------------------------------------------------------  Recent Labs  02/10/17 0459  CHOL 160  HDL 50  LDLCALC 93  TRIG 86  CHOLHDL 3.2    Lab Results  Component Value Date   HGBA1C 8.7 (H) 02/09/2017   ------------------------------------------------------------------------------------------------------------------  Recent Labs  02/09/17 1152  TSH 2.799   ------------------------------------------------------------------------------------------------------------------ No results for input(s): VITAMINB12, FOLATE, FERRITIN, TIBC, IRON, RETICCTPCT in the last 72 hours.  Coagulation profile  Recent Labs Lab 02/09/17 1203 02/10/17 0459  INR 1.02 1.13     Recent Labs  02/09/17 1152  DDIMER 5.87*    Cardiac Enzymes No results for input(s): CKMB, TROPONINI, MYOGLOBIN in the last 168 hours.  Invalid input(s): CK ------------------------------------------------------------------------------------------------------------------    Component Value Date/Time   BNP 78.0 02/09/2017 1152    Inpatient Medications  Scheduled Meds: . amiodarone  200 mg Oral BID  . aspirin EC  81 mg Oral Daily  . ciprofloxacin  500 mg Oral BID  . furosemide  20 mg Oral Daily  . insulin aspart  0-9 Units Subcutaneous TID WC  . insulin aspart  3 Units Subcutaneous  TID WC  . levothyroxine  75 mcg Oral QAC breakfast  . lubiprostone  24 mcg Oral QODAY  . metoprolol tartrate  25 mg Oral BID  . pantoprazole  40 mg Oral Q0600  . potassium chloride  40 mEq Oral BID  . risperiDONE  1 mg Oral QHS  . Rivaroxaban  15 mg Oral BID WC  . sodium chloride flush  3 mL Intravenous Q12H  . tamsulosin  0.4 mg Oral QHS   Continuous Infusions: . sodium chloride     PRN Meds:.sodium chloride, acetaminophen, ondansetron (ZOFRAN) IV, sodium chloride flush  Micro Results Recent Results (from the past 240 hour(s))  Urine culture     Status: Abnormal   Collection Time: 02/02/17  5:12 PM  Result Value Ref Range Status   Specimen Description URINE, CLEAN CATCH  Final   Special Requests NONE  Final   Culture >=100,000 COLONIES/mL ENTEROBACTER SPECIES (A)  Final   Report Status 02/05/2017 FINAL  Final  Organism ID, Bacteria ENTEROBACTER SPECIES (A)  Final      Susceptibility   Enterobacter species - MIC*    CEFAZOLIN >=64 RESISTANT Resistant     CEFTRIAXONE >=64 RESISTANT Resistant     CIPROFLOXACIN <=0.25 SENSITIVE Sensitive     GENTAMICIN <=1 SENSITIVE Sensitive     IMIPENEM 0.5 SENSITIVE Sensitive     NITROFURANTOIN 128 RESISTANT Resistant     TRIMETH/SULFA <=20 SENSITIVE Sensitive     PIP/TAZO 64 INTERMEDIATE Intermediate     * >=100,000 COLONIES/mL ENTEROBACTER SPECIES    Radiology Reports Dg Chest 2 View  Result Date: 02/02/2017 CLINICAL DATA:  Dyspnea. Heart fluttering for 1 week. History of atrial fibrillation. EXAM: CHEST  2 VIEW COMPARISON:  10/18/2012 radiographs. FINDINGS: The heart size and mediastinal contours are stable status post median sternotomy and CABG. Left basilar scarring or atelectasis is similar to the prior study. There is no edema, confluent airspace opacity, pleural effusion or pneumothorax. No acute osseous findings are seen. IMPRESSION: Stable postoperative chest.  No acute cardiopulmonary process. Electronically Signed   By: Richardean Sale M.D.   On: 02/02/2017 16:50   Nm Pulmonary Perf And Vent  Result Date: 02/09/2017 CLINICAL DATA:  Short of breath. Elevated D-dimer. Concern pulmonary embolism. EXAM: NUCLEAR MEDICINE VENTILATION - PERFUSION LUNG SCAN TECHNIQUE: Ventilation images were obtained in multiple projections using inhaled aerosol Tc-26m DTPA. Perfusion images were obtained in multiple projections after intravenous injection of Tc-7m MAA. RADIOPHARMACEUTICALS:  Thirty-three mCi Technetium-53m DTPA aerosol inhalation and 3.8 mCi Technetium-25m MAA IV COMPARISON:  Chest radiograph 02/09/2017 FINDINGS: Ventilation: Central deposition of the radiotracer in the proximal bronchi. There is no peripheral perfusion ventilation defects identified. Perfusion: Several small peripheral perfusion defects are present in the upper lobes on LEFT and RIGHT nodule adn to a lesser degree lower lobes. a These small peripheral perfusion defects are not matched by ventilation defects. IMPRESSION: Several small unmatched peripheral perfusion defects in the upper lobes are concerning for acute pulmonary embolism. High probability. These results will be called to the ordering clinician or representative by the Radiologist Assistant, and communication documented in the PACS or zVision Dashboard. Electronically Signed   By: Suzy Bouchard M.D.   On: 02/09/2017 14:29   US Venous Img Lower Bilateral  Result Date: 02/09/2017 CLINICAL DATA:  Bilateral lower extremity edema with shortness of breath for 10 days EXAM: BILATERAL LOWER EXTREMITY VENOUS DOPPLER ULTRASOUND TECHNIQUE: Gray-scale sonography with graded compression, as well as color Doppler and duplex ultrasound were performed to evaluate the lower extremity deep venous systems from the level of the common femoral vein and including the common femoral, femoral, profunda femoral, popliteal and calf veins including the posterior tibial, peroneal and gastrocnemius veins when visible. The superficial  great saphenous vein was also interrogated. Spectral Doppler was utilized to evaluate flow at rest and with distal augmentation maneuvers in the common femoral, femoral and popliteal veins. COMPARISON:  None. FINDINGS: RIGHT LOWER EXTREMITY Common Femoral Vein: No evidence of thrombus. Normal compressibility, respiratory phasicity and response to augmentation. Saphenofemoral Junction: No evidence of thrombus. Normal compressibility and flow on color Doppler imaging. Profunda Femoral Vein: No evidence of thrombus. Normal compressibility and flow on color Doppler imaging. Femoral Vein: No evidence of thrombus. Normal compressibility, respiratory phasicity and response to augmentation. Popliteal Vein: No evidence of thrombus. Normal compressibility, respiratory phasicity and response to augmentation. Calf Veins: No evidence of thrombus. Normal compressibility and flow on color Doppler imaging. Superficial Great Saphenous Vein: No evidence of thrombus. Normal  compressibility and flow on color Doppler imaging. Venous Reflux:  None. Other Findings:  None. LEFT LOWER EXTREMITY Common Femoral Vein: No evidence of thrombus. Normal compressibility, respiratory phasicity and response to augmentation. Saphenofemoral Junction: No evidence of thrombus. Normal compressibility and flow on color Doppler imaging. Profunda Femoral Vein: No evidence of thrombus. Normal compressibility and flow on color Doppler imaging. Femoral Vein: No evidence of thrombus. Normal compressibility, respiratory phasicity and response to augmentation. Popliteal Vein: No evidence of thrombus. Normal compressibility, respiratory phasicity and response to augmentation. Calf Veins: No evidence of thrombus. Normal compressibility and flow on color Doppler imaging. Superficial Great Saphenous Vein: No evidence of thrombus. Normal compressibility and flow on color Doppler imaging. Venous Reflux:  None. Other Findings: Left popliteal fossa minimally complex  thick-walled Bakers cyst measures 4.5 x 3.6 cm IMPRESSION: No evidence of DVT within either lower extremity. 4.5 cm left Bakers cyst. Electronically Signed   By: Jerilynn Mages.  Shick M.D.   On: 02/09/2017 13:21   Dg Chest Port 1 View  Result Date: 02/10/2017 CLINICAL DATA:  Shortness of Breath EXAM: PORTABLE CHEST 1 VIEW COMPARISON:  February 09, 2017 FINDINGS: There is left base atelectasis with small left pleural effusion. Lungs elsewhere are clear. Heart is upper normal in size with pulmonary vascularity within normal limits. No adenopathy. Patient is status post median sternotomy. No evident bone lesions. No appreciable pneumothorax. IMPRESSION: Mild left base atelectasis with small left pleural effusion. Lungs elsewhere are clear. Stable cardiac silhouette. Electronically Signed   By: Lowella Grip III M.D.   On: 02/10/2017 07:57   Dg Chest Port 1 View  Result Date: 02/09/2017 CLINICAL DATA:  Shortness of breath, hypertension, history of coronary artery disease and CABG, diabetes, CHF, former smoker. EXAM: PORTABLE CHEST 1 VIEW COMPARISON:  Chest x-ray of February 02, 2017 FINDINGS: The lungs are adequately inflated. The interstitial markings are slightly more conspicuous today. The cardiac silhouette remains enlarged. The central pulmonary vascularity is mildly prominent. Patient has undergone previous median sternotomy. IMPRESSION: The findings suggest low-grade CHF with very mild interstitial edema. There is no alveolar pneumonia. Electronically Signed   By: David  Martinique M.D.   On: 02/09/2017 12:07    Time Spent in minutes  25   Louellen Molder M.D on 02/12/2017 at 10:05 AM  Between 7am to 7pm - Pager - [phone]  After 7pm go to [email or web address] - password [facility]  [facility] -  Office  [phone]

## 2017-02-12 NOTE — Care Management Important Message (Signed)
Important Message  Patient Details  Name: ABDULAHAD MEDEROS MRN: [id number] Date of Birth: 08-Apr-1934   Medicare Important Message Given:  Yes    Sherald Barge, RN 02/12/2017, 1:19 PM

## 2017-02-12 NOTE — Care Management Note (Signed)
Case Management Note  Patient Details  Name: RUE TINNEL MRN: [id number] Date of Birth: [birth year]  Expected Discharge Date:    02/13/2017              Expected Discharge Plan:  [place]  In-House Referral:  NA  Discharge planning Services  CM Consult  Post Acute Care Choice:  Home Health, Resumption of Svcs/PTA Provider Choice offered to:  Patient  HH Arranged:  RN, PT [facility] Agency:  [place]  Status of Service:  Completed, signed off  Additional Comments: [place] home over weekend. Pt has been given xeralto free month card by pharmacy. Pt understands HH has 48 hrs to make resumption visit. Vaughan Basta, [facility] rep, aware of potential DC home over weekend. Pt on [facility] registry and has been referred for [facility] Transition calls. Dtr not present, emmi calls explained to pt. Pt also on oxygen acutely, will need to wean from oxygen or have home O2 assessment prior to DC. Pt would like to use [facility] for oxygen if he needs it.   Sherald Barge, RN 02/12/2017, 1:11 PM

## 2017-02-12 NOTE — Progress Notes (Signed)
Progress Note  Patient Name: Adam Peck Date of Encounter: 02/12/2017  Primary Cardiologist: Dr. Satira Sark  Subjective   Denies chest pain, palpitations, and shortness of breath.  Inpatient Medications    Scheduled Meds: . amiodarone  200 mg Oral BID  . aspirin EC  81 mg Oral Daily  . ciprofloxacin  500 mg Oral BID  . furosemide  20 mg Oral Daily  . insulin aspart  0-9 Units Subcutaneous TID WC  . insulin aspart  3 Units Subcutaneous TID WC  . levothyroxine  75 mcg Oral QAC breakfast  . lubiprostone  24 mcg Oral QODAY  . metoprolol tartrate  25 mg Oral BID  . pantoprazole  40 mg Oral Q0600  . potassium chloride  40 mEq Oral BID  . risperiDONE  1 mg Oral QHS  . Rivaroxaban  15 mg Oral BID WC  . sodium chloride flush  3 mL Intravenous Q12H  . tamsulosin  0.4 mg Oral QHS   Continuous Infusions: . sodium chloride     PRN Meds: sodium chloride, acetaminophen, ondansetron (ZOFRAN) IV, sodium chloride flush   Vital Signs    Vitals:   02/11/17 1400 02/11/17 1949 02/11/17 2336 02/12/17 0656  BP: (!) 112/58  (!) 96/51 (!) 102/59  Pulse: 60  (!) 58 (!) 56  Resp: 18  19 16   Temp: 97.8 F (36.6 C)  97.8 F (36.6 C) 97.8 F (36.6 C)  TempSrc: Oral  Oral Oral  SpO2: 98% 96% 97% 94%  Weight:    184 lb 12.8 oz (83.8 kg)  Height:        Intake/Output Summary (Last 24 hours) at 02/12/17 0951 Last data filed at 02/12/17 0900  Gross per 24 hour  Intake              720 ml  Output             2050 ml  Net            -1330 ml   Filed Weights   02/10/17 1800 02/11/17 0500 02/12/17 0656  Weight: 189 lb 12.8 oz (86.1 kg) 190 lb (86.2 kg) 184 lb 12.8 oz (83.8 kg)    Telemetry    Atrial fibrillation and flutter, HR 90-100 bpm - Personally Reviewed    Physical Exam   GEN: No acute distress.   Neck: No JVD Cardiac: Irregular rhythm, rate at upper normal limits, no murmurs, rubs, or gallops.  Respiratory: Clear to auscultation bilaterally. GI: Soft,  nontender, non-distended  MS: No edema; No deformity. Neuro:  Nonfocal  Psych: Normal affect   Labs    Chemistry Recent Labs Lab 02/09/17 1152 02/10/17 0459 02/11/17 0438 02/12/17 0435  NA 133* 133* 131* 131*  K 4.3 4.5 4.7 4.6  CL 96* 97* 95* 95*  CO2 28 29 28 27   GLUCOSE 188* 204* 202* 194*  BUN 28* 30* 33* 32*  CREATININE 1.42* 1.60* 1.86* 1.82*  CALCIUM 8.6* 8.2* 7.9* 7.9*  PROT 5.8* 5.5*  --   --   ALBUMIN 2.8* 2.7*  --   --   AST 15 13*  --   --   ALT 48 40  --   --   ALKPHOS 45 45  --   --   BILITOT 0.7 1.0  --   --   GFRNONAA 45* 39* 33* 33*  GFRAA 52* 45* 38* 39*  ANIONGAP 9 7 8 9      Hematology Recent Labs Lab 02/10/17  2633 02/11/17 0438 02/12/17 0435  WBC 11.5* 8.6 7.7  RBC 4.58 4.41 4.41  HGB 13.0 12.6* 12.6*  HCT 39.2 38.3* 38.6*  MCV 85.6 86.8 87.5  MCH 28.4 28.6 28.6  MCHC 33.2 32.9 32.6  RDW 14.7 14.7 14.7  PLT 199 190 223    Cardiac EnzymesNo results for input(s): TROPONINI in the last 168 hours. No results for input(s): TROPIPOC in the last 168 hours.   BNP Recent Labs Lab 02/09/17 1152  BNP 78.0     DDimer  Recent Labs Lab 02/09/17 1152  DDIMER 5.87*     Radiology    No results found.  Cardiac Studies   Echocardiogram 02/04/2017: Study Conclusions  - Left ventricle: The cavity size was normal. Wall thickness was increased in a pattern of mild LVH. The estimated ejection fraction was 55%. The study is not technically sufficient to allow evaluation of LV diastolic function. - Aortic valve: Mildly calcified annulus. Trileaflet; mildly calcified leaflets. - Mitral valve: There was mild regurgitation. - Left atrium: The atrium was at the upper limits of normal in size. - Right ventricle: Systolic function was mildly reduced. - Right atrium: Central venous pressure (est): 3 mm Hg. - Tricuspid valve: There was mild regurgitation. - Pulmonary arteries: PA peak pressure: 39 mm Hg (S). - Pericardium,  extracardiac: There was no pericardial effusion.  Impressions:  - Mild LVH with LVEF approximately 55% based on imaging in the latter portions of the study when heart rate had decreased. Diastolic function is indeterminate in the setting of atrial flutter. Upper normal left atrial chamber size. Mild mitral regurgitation. Mildly sclerotic aortic valve. Mildly reduced right ventricular contraction. Mild tricuspid regurgitation with PASP estimated 39 mmHg.  Patient Profile     83 y.o. male with a history of persistent atrial fibrillation/flutter, CHADSVASC score of 6, CAD status post CABG, hypertension, acute on chronic diastolic heart failure, and recently documented hypoxia with diagnosis of acute pulmonary embolus based on high probability VQ scan.  Assessment & Plan    1. Rapid atrial fibrillation/flutter with CHADSVASC score of 6. Heart rate control is reasonable on higher dose Lopressor with rates in 90-100 bpm range. He also continues on amiodarone 200 mg twice daily and has been transitioned to Xarelto with concurrent diagnosis of acute pulmonary embolus.  2. Acute bilateral upper lobe pulmonary embolus based on high probability VQ scan. This would explain his recent hypoxia and also likely contributing to his atrial arrhythmia. He is now on Xarelto.  3. CAD status post CABG. No active angina symptoms. LVEF 55% by recent echocardiogram. Continue ASA and metoprolol.  4. Progressive renal insufficiency, creatinine at 1.82.  5. Acute diastolic heart failure: Resolved. Now on Lasix 20 mg daily. Over 1 L output in last 24 hours.   For questions or updates, please contact [place] Please consult [email or web address] for contact info under Cardiology/STEMI.      Signed, Kate Sable, MD  02/12/2017, 9:51 AM

## 2017-02-13 DIAGNOSIS — R531 Weakness: Secondary | ICD-10-CM

## 2017-02-13 DIAGNOSIS — R0600 Dyspnea, unspecified: Secondary | ICD-10-CM

## 2017-02-13 DIAGNOSIS — E1121 Type 2 diabetes mellitus with diabetic nephropathy: Secondary | ICD-10-CM

## 2017-02-13 DIAGNOSIS — I5033 Acute on chronic diastolic (congestive) heart failure: Secondary | ICD-10-CM

## 2017-02-13 DIAGNOSIS — I10 Essential (primary) hypertension: Secondary | ICD-10-CM

## 2017-02-13 DIAGNOSIS — N182 Chronic kidney disease, stage 2 (mild): Secondary | ICD-10-CM

## 2017-02-13 DIAGNOSIS — I481 Persistent atrial fibrillation: Secondary | ICD-10-CM

## 2017-02-13 DIAGNOSIS — R339 Retention of urine, unspecified: Secondary | ICD-10-CM

## 2017-02-13 DIAGNOSIS — I2699 Other pulmonary embolism without acute cor pulmonale: Principal | ICD-10-CM

## 2017-02-13 DIAGNOSIS — I2510 Atherosclerotic heart disease of native coronary artery without angina pectoris: Secondary | ICD-10-CM

## 2017-02-13 DIAGNOSIS — E785 Hyperlipidemia, unspecified: Secondary | ICD-10-CM

## 2017-02-13 LAB — BASIC METABOLIC PANEL
ANION GAP: 9 (ref 5–15)
BUN: 32 mg/dL — ABNORMAL HIGH (ref 6–20)
CHLORIDE: 101 mmol/L (ref 101–111)
CO2: 24 mmol/L (ref 22–32)
CREATININE: 1.82 mg/dL — AB (ref 0.61–1.24)
Calcium: 8.1 mg/dL — ABNORMAL LOW (ref 8.9–10.3)
GFR calc non Af Amer: 33 mL/min — ABNORMAL LOW (ref >60)
GFR, EST AFRICAN AMERICAN: 39 mL/min — AB (ref >60)
GLUCOSE: 159 mg/dL — AB (ref 65–99)
Potassium: 4.5 mmol/L (ref 3.5–5.1)
SODIUM: 134 mmol/L — AB (ref 135–145)

## 2017-02-13 LAB — GLUCOSE, CAPILLARY: GLUCOSE-CAPILLARY: 155 mg/dL — AB (ref 65–99)

## 2017-02-13 LAB — CBC
HCT: 37.6 % — ABNORMAL LOW (ref 39.0–52.0)
HEMOGLOBIN: 12.2 g/dL — AB (ref 13.0–17.0)
MCH: 28.3 pg (ref 26.0–34.0)
MCHC: 32.4 g/dL (ref 30.0–36.0)
MCV: 87.2 fL (ref 78.0–100.0)
Platelets: 290 10*3/uL (ref 150–400)
RBC: 4.31 MIL/uL (ref 4.22–5.81)
RDW: 14.7 % (ref 11.5–15.5)
WBC: 7.1 10*3/uL (ref 4.0–10.5)

## 2017-02-13 MED ORDER — INSULIN GLARGINE 100 UNIT/ML ~~LOC~~ SOLN: 8.0000 [IU] | Freq: Every day | SUBCUTANEOUS | 11 refills | Status: AC

## 2017-02-13 MED ORDER — METOPROLOL TARTRATE 25 MG PO TABS: 25.0000 mg | ORAL_TABLET | Freq: Two times a day (BID) | ORAL | 2 refills | Status: AC

## 2017-02-13 MED ORDER — LOSARTAN POTASSIUM 100 MG PO TABS: 50.0000 mg | ORAL_TABLET | Freq: Every day | ORAL | 2 refills | Status: AC

## 2017-02-13 MED ORDER — FUROSEMIDE 20 MG PO TABS: 20.0000 mg | ORAL_TABLET | Freq: Every day | ORAL | 2 refills | Status: AC

## 2017-02-13 MED ORDER — RIVAROXABAN (XARELTO) VTE STARTER PACK (15 & 20 MG): ORAL_TABLET | ORAL | 0 refills | Status: AC

## 2017-02-13 NOTE — Progress Notes (Signed)
Patient is alert and oriented. Vital signs are stable. Saline lock removed. Patient has ambulated in the hall this am with a walker and without difficulty. Discharge instructions given. Prescriptions given. Patient and family verbalized understanding of instructions. Patient left floor via wheelchair with family and nursing staff.

## 2017-02-13 NOTE — Discharge Summary (Signed)
Physician Discharge Summary  Adam Peck VXB:[id number] DOB: 1934-04-06 DOA: 02/09/2017  PCP: Curlene Labrum, MD  Admit date: 02/09/2017 Discharge date: 02/13/2017  Time spent: 35 minutes  Recommendations for Outpatient Follow-up:  1. Follow-up cardiology in 2 weeks 2. Follow PCP in 2 weeks   Discharge Diagnoses:  Principal Problem:   Other pulmonary embolism without acute cor pulmonale (HCC) Active Problems:   Hypothyroidism   Hyperlipidemia   Essential hypertension, benign   Coronary artery disease involving native coronary artery of native heart without angina pectoris   CKD (chronic kidney disease) stage 2, GFR 60-89 ml/min   Diabetes (HCC)   Paroxysmal atrial fibrillation (HCC)   Weakness   History of gout   GERD (gastroesophageal reflux disease)   Acute dyspnea   Acute on chronic diastolic heart failure (HCC)   Urinary retention   Persistent atrial fibrillation (HCC)   Acute renal failure superimposed on stage 2 chronic kidney disease ([place])   Discharge Condition: He is stable  Diet recommendation: Heart healthy diet, carb modified  Filed Weights   02/11/17 0500 02/12/17 0656 02/13/17 0502  Weight: 86.2 kg (190 lb) 83.8 kg (184 lb 12.8 oz) 85.1 kg (187 lb 9.6 oz)    History of present illness:  83 year old male with newly diagnosed A. fib with RVR being treated on beta blocker and amiodarone, CAD with history of CABG in 2014, hypertension, BPH with urinary retention on indwelling Foley catheter who was seen in cardiologist office for generalized weakness and increased shortness of breath for past several days. Also has been unsteady on his feet. Patient was seen in the cardiology office with uncontrolled A. fib with fluctuating heart rate (occasionally going as high as 150s). Patient was also increasingly short of breath. Patient omitted to hospitalist service for father management of his increasing shortness of breath with generalized weakness and poorly  controlled A. fib. A VQ scan done following admission showed a high probability for PE.   Hospital Course:   Pulmonary embolism- VQ scan showed hypermobility for pulmonary embolism. Lower extremity ultrasound was negative for DVT. Patient was initially started on IV heparin and transitioned to oral Xarelto. At this time patient discharged on Xarelto.  Atrial fibrillation with RVR- improved patient has CHA2DS2VASc score of 6, he is on anticoagulation with Xarelto. Metoprolol dose has been increased 25 mg by mouth twice a day. Heart rate is well controlled  Acute diastolic CHF- improved after IV Lasix. Patient has lost 13 pounds weight since admission. Renal function stable. Patient currently switch to Lasix 20 mg by mouth daily.  Hypothyroidism-TSH is normal, continue Synthroid.  Coronary artery disease- aspirin dose reduced to 81 mg by mouth daily. Continue metoprolol  Diabetes mellitus with diabetic nephropathy- metformin has been discontinued due to renal insufficiency. Patient started on low-dose Lantus 8 units subcutaneous daily at bedtime.  Generalized weakness-will send patient on home health physical therapy  BPH with urinary retention-continue indwelling Foley catheter. Flomax.  Hypertension- amlodipine has been discontinued dose of metoprolol increased to 25 mg by mouth twice a day. Losartan dose will be cut down to 50 mg by mouth daily due to worsening renal function.  Enterobacter UTI-completed antibiotics with Cipro in the hospital.  Hyperlipidemia-patient is intolerant to statin and Zetia.  Hypomagnesemia-replenished  Chronic constipation-seen by GI as outpatient on 10-18. Continue Amitiza  Procedures:  None   Consultations:  Cardiology  Discharge Exam: Vitals:   02/12/17 2336 02/13/17 0502  BP: 119/82 (!) 122/57  Pulse: 88 (!) 52  Resp:  16  Temp:  98.2 F (36.8 C)  SpO2: 93% 91%    General: Appears in no acute distress Cardiovascular: S1-S2,  irregular Respiratory: Clear to auscultation bilaterally  Discharge Instructions   Discharge Instructions    Diet - low sodium heart healthy    Complete by:  As directed    Increase activity slowly    Complete by:  As directed      Current Discharge Medication List    START taking these medications   Details  furosemide (LASIX) 20 MG tablet Take 1 tablet (20 mg total) by mouth daily. Qty: 30 tablet, Refills: 2    insulin glargine (LANTUS) 100 UNIT/ML injection Inject 0.08 mLs (8 Units total) into the skin at bedtime. Qty: 10 mL, Refills: 11    Rivaroxaban (XARELTO STARTER PACK) 15 & 20 MG TBPK Take as directed on package: Start with one 15mg  tablet by mouth twice a day with food for 17 more days . On Day 18, switch to one 20mg  tablet once a day with food. Qty: 51 each, Refills: 0      CONTINUE these medications which have CHANGED   Details  losartan (COZAAR) 100 MG tablet Take 0.5 tablets (50 mg total) by mouth daily. Qty: 30 tablet, Refills: 2      CONTINUE these medications which have NOT CHANGED   Details  amiodarone (PACERONE) 200 MG tablet Take 1 tablet (200 mg total) by mouth 2 (two) times daily. Qty: 180 tablet, Refills: 3    aspirin EC 81 MG tablet Take 324 mg by mouth daily.     Aspirin-Salicylamide-Caffeine (BC FAST PAIN RELIEF) 650-195-33.3 MG PACK Take 1 packet by mouth daily as needed (for pain.).    levothyroxine (SYNTHROID, LEVOTHROID) 75 MCG tablet Take 75 mcg by mouth daily before breakfast.     lubiprostone (AMITIZA) 24 MCG capsule Take 1 capsule (24 mcg total) by mouth 2 (two) times daily with a meal. Qty: 60 capsule, Refills: 5    metoprolol tartrate (LOPRESSOR) 25 MG tablet Take 25 mg by mouth 2 (two) times daily. Qty: 180 tablet, Refills: 3    NITROSTAT 0.4 MG SL tablet Take 0.4 tablets by mouth every 5 (five) minutes x 3 doses as needed for chest pain.     risperiDONE (RISPERDAL) 1 MG tablet Take 1 mg by mouth at bedtime.     Tamsulosin HCl  (FLOMAX) 0.4 MG CAPS Take 0.4 mg by mouth at bedtime.       STOP taking these medications     amLODipine (NORVASC) 5 MG tablet      dexamethasone (DECADRON) 4 MG tablet      metFORMIN (GLUCOPHAGE) 500 MG tablet      sulfamethoxazole-trimethoprim (BACTRIM DS,SEPTRA DS) 800-160 MG tablet        Allergies  Allergen Reactions  . Ezetimibe Other (See Comments)    Reaction:muscle cramps  . Statins Other (See Comments)    Reaction:muscle cramps      The results of significant diagnostics from this hospitalization (including imaging, microbiology, ancillary and laboratory) are listed below for reference.    Significant Diagnostic Studies: Dg Chest 2 View  Result Date: 02/02/2017 CLINICAL DATA:  Dyspnea. Heart fluttering for 1 week. History of atrial fibrillation. EXAM: CHEST  2 VIEW COMPARISON:  10/18/2012 radiographs. FINDINGS: The heart size and mediastinal contours are stable status post median sternotomy and CABG. Left basilar scarring or atelectasis is similar to the prior study. There is no edema, confluent airspace opacity, pleural  effusion or pneumothorax. No acute osseous findings are seen. IMPRESSION: Stable postoperative chest.  No acute cardiopulmonary process. Electronically Signed   By: Richardean Sale M.D.   On: 02/02/2017 16:50   Nm Pulmonary Perf And Vent  Result Date: 02/09/2017 CLINICAL DATA:  Short of breath. Elevated D-dimer. Concern pulmonary embolism. EXAM: NUCLEAR MEDICINE VENTILATION - PERFUSION LUNG SCAN TECHNIQUE: Ventilation images were obtained in multiple projections using inhaled aerosol Tc-33m DTPA. Perfusion images were obtained in multiple projections after intravenous injection of Tc-50m MAA. RADIOPHARMACEUTICALS:  Thirty-three mCi Technetium-63m DTPA aerosol inhalation and 3.8 mCi Technetium-39m MAA IV COMPARISON:  Chest radiograph 02/09/2017 FINDINGS: Ventilation: Central deposition of the radiotracer in the proximal bronchi. There is no peripheral  perfusion ventilation defects identified. Perfusion: Several small peripheral perfusion defects are present in the upper lobes on LEFT and RIGHT nodule adn to a lesser degree lower lobes. a These small peripheral perfusion defects are not matched by ventilation defects. IMPRESSION: Several small unmatched peripheral perfusion defects in the upper lobes are concerning for acute pulmonary embolism. High probability. These results will be called to the ordering clinician or representative by the Radiologist Assistant, and communication documented in the PACS or zVision Dashboard. Electronically Signed   By: Suzy Bouchard M.D.   On: 02/09/2017 14:29   US Venous Img Lower Bilateral  Result Date: 02/09/2017 CLINICAL DATA:  Bilateral lower extremity edema with shortness of breath for 10 days EXAM: BILATERAL LOWER EXTREMITY VENOUS DOPPLER ULTRASOUND TECHNIQUE: Gray-scale sonography with graded compression, as well as color Doppler and duplex ultrasound were performed to evaluate the lower extremity deep venous systems from the level of the common femoral vein and including the common femoral, femoral, profunda femoral, popliteal and calf veins including the posterior tibial, peroneal and gastrocnemius veins when visible. The superficial great saphenous vein was also interrogated. Spectral Doppler was utilized to evaluate flow at rest and with distal augmentation maneuvers in the common femoral, femoral and popliteal veins. COMPARISON:  None. FINDINGS: RIGHT LOWER EXTREMITY Common Femoral Vein: No evidence of thrombus. Normal compressibility, respiratory phasicity and response to augmentation. Saphenofemoral Junction: No evidence of thrombus. Normal compressibility and flow on color Doppler imaging. Profunda Femoral Vein: No evidence of thrombus. Normal compressibility and flow on color Doppler imaging. Femoral Vein: No evidence of thrombus. Normal compressibility, respiratory phasicity and response to augmentation.  Popliteal Vein: No evidence of thrombus. Normal compressibility, respiratory phasicity and response to augmentation. Calf Veins: No evidence of thrombus. Normal compressibility and flow on color Doppler imaging. Superficial Great Saphenous Vein: No evidence of thrombus. Normal compressibility and flow on color Doppler imaging. Venous Reflux:  None. Other Findings:  None. LEFT LOWER EXTREMITY Common Femoral Vein: No evidence of thrombus. Normal compressibility, respiratory phasicity and response to augmentation. Saphenofemoral Junction: No evidence of thrombus. Normal compressibility and flow on color Doppler imaging. Profunda Femoral Vein: No evidence of thrombus. Normal compressibility and flow on color Doppler imaging. Femoral Vein: No evidence of thrombus. Normal compressibility, respiratory phasicity and response to augmentation. Popliteal Vein: No evidence of thrombus. Normal compressibility, respiratory phasicity and response to augmentation. Calf Veins: No evidence of thrombus. Normal compressibility and flow on color Doppler imaging. Superficial Great Saphenous Vein: No evidence of thrombus. Normal compressibility and flow on color Doppler imaging. Venous Reflux:  None. Other Findings: Left popliteal fossa minimally complex thick-walled Bakers cyst measures 4.5 x 3.6 cm IMPRESSION: No evidence of DVT within either lower extremity. 4.5 cm left Bakers cyst. Electronically Signed   By: Jerilynn Mages.  Shick  M.D.   On: 02/09/2017 13:21   Dg Chest Port 1 View  Result Date: 02/10/2017 CLINICAL DATA:  Shortness of Breath EXAM: PORTABLE CHEST 1 VIEW COMPARISON:  February 09, 2017 FINDINGS: There is left base atelectasis with small left pleural effusion. Lungs elsewhere are clear. Heart is upper normal in size with pulmonary vascularity within normal limits. No adenopathy. Patient is status post median sternotomy. No evident bone lesions. No appreciable pneumothorax. IMPRESSION: Mild left base atelectasis with small left  pleural effusion. Lungs elsewhere are clear. Stable cardiac silhouette. Electronically Signed   By: Lowella Grip III M.D.   On: 02/10/2017 07:57   Dg Chest Port 1 View  Result Date: 02/09/2017 CLINICAL DATA:  Shortness of breath, hypertension, history of coronary artery disease and CABG, diabetes, CHF, former smoker. EXAM: PORTABLE CHEST 1 VIEW COMPARISON:  Chest x-ray of February 02, 2017 FINDINGS: The lungs are adequately inflated. The interstitial markings are slightly more conspicuous today. The cardiac silhouette remains enlarged. The central pulmonary vascularity is mildly prominent. Patient has undergone previous median sternotomy. IMPRESSION: The findings suggest low-grade CHF with very mild interstitial edema. There is no alveolar pneumonia. Electronically Signed   By: David  Martinique M.D.   On: 02/09/2017 12:07    Microbiology: No results found for this or any previous visit (from the past 240 hour(s)).   Labs: Basic Metabolic Panel:  Recent Labs Lab 02/09/17 1152 02/10/17 0459 02/11/17 0438 02/12/17 0435 02/13/17 0600  NA 133* 133* 131* 131* 134*  K 4.3 4.5 4.7 4.6 4.5  CL 96* 97* 95* 95* 101  CO2 28 29 28 27 24   GLUCOSE 188* 204* 202* 194* 159*  BUN 28* 30* 33* 32* 32*  CREATININE 1.42* 1.60* 1.86* 1.82* 1.82*  CALCIUM 8.6* 8.2* 7.9* 7.9* 8.1*  MG 1.7 1.4* 2.1  --   --    Liver Function Tests:  Recent Labs Lab 02/09/17 1152 02/10/17 0459  AST 15 13*  ALT 48 40  ALKPHOS 45 45  BILITOT 0.7 1.0  PROT 5.8* 5.5*  ALBUMIN 2.8* 2.7*   No results for input(s): LIPASE, AMYLASE in the last 168 hours. No results for input(s): AMMONIA in the last 168 hours. CBC:  Recent Labs Lab 02/09/17 1152 02/10/17 0459 02/11/17 0438 02/12/17 0435 02/13/17 0600  WBC 14.0* 11.5* 8.6 7.7 7.1  NEUTROABS 12.3*  --   --   --   --   HGB 13.3 13.0 12.6* 12.6* 12.2*  HCT 40.3 39.2 38.3* 38.6* 37.6*  MCV 86.9 85.6 86.8 87.5 87.2  PLT 172 199 190 223 290   Cardiac  Enzymes:  Recent Labs Lab 02/09/17 1152  CKTOTAL 52   BNP: BNP (last 3 results)  Recent Labs  02/02/17 1600 02/09/17 1152  BNP 183.0* 78.0    ProBNP (last 3 results) No results for input(s): PROBNP in the last 8760 hours.  CBG:  Recent Labs Lab 02/12/17 0803 02/12/17 1150 02/12/17 1623 02/12/17 2136 02/13/17 0736  GLUCAP 189* 266* 140* 188* 155*       Signed:  Eleonore Chiquito S MD.  [facility] 02/13/2017, 11:12 AM

## 2017-02-13 NOTE — Progress Notes (Signed)
SATURATION QUALIFICATIONS: (This note is used to comply with regulatory documentation for home oxygen)  Patient Saturations on Room Air at Rest = 95%  Patient Saturations on Room Air while Ambulating = 92%   

## 2017-02-14 DIAGNOSIS — Z7984 Long term (current) use of oral hypoglycemic drugs: Secondary | ICD-10-CM | POA: Diagnosis not present

## 2017-02-14 DIAGNOSIS — E039 Hypothyroidism, unspecified: Secondary | ICD-10-CM | POA: Diagnosis not present

## 2017-02-14 DIAGNOSIS — R339 Retention of urine, unspecified: Secondary | ICD-10-CM | POA: Diagnosis not present

## 2017-02-14 DIAGNOSIS — Z466 Encounter for fitting and adjustment of urinary device: Secondary | ICD-10-CM | POA: Diagnosis not present

## 2017-02-14 DIAGNOSIS — I10 Essential (primary) hypertension: Secondary | ICD-10-CM | POA: Diagnosis not present

## 2017-02-14 DIAGNOSIS — G540 Brachial plexus disorders: Secondary | ICD-10-CM | POA: Diagnosis not present

## 2017-02-14 DIAGNOSIS — E1140 Type 2 diabetes mellitus with diabetic neuropathy, unspecified: Secondary | ICD-10-CM | POA: Diagnosis not present

## 2017-02-14 DIAGNOSIS — Z7982 Long term (current) use of aspirin: Secondary | ICD-10-CM | POA: Diagnosis not present

## 2017-02-14 DIAGNOSIS — M542 Cervicalgia: Secondary | ICD-10-CM | POA: Diagnosis not present

## 2017-02-14 DIAGNOSIS — Z79891 Long term (current) use of opiate analgesic: Secondary | ICD-10-CM | POA: Diagnosis not present

## 2017-02-14 DIAGNOSIS — I2510 Atherosclerotic heart disease of native coronary artery without angina pectoris: Secondary | ICD-10-CM | POA: Diagnosis not present

## 2017-02-15 DIAGNOSIS — Z466 Encounter for fitting and adjustment of urinary device: Secondary | ICD-10-CM | POA: Diagnosis not present

## 2017-02-15 DIAGNOSIS — E1140 Type 2 diabetes mellitus with diabetic neuropathy, unspecified: Secondary | ICD-10-CM | POA: Diagnosis not present

## 2017-02-15 DIAGNOSIS — Z7984 Long term (current) use of oral hypoglycemic drugs: Secondary | ICD-10-CM | POA: Diagnosis not present

## 2017-02-15 DIAGNOSIS — R339 Retention of urine, unspecified: Secondary | ICD-10-CM | POA: Diagnosis not present

## 2017-02-15 DIAGNOSIS — I10 Essential (primary) hypertension: Secondary | ICD-10-CM | POA: Diagnosis not present

## 2017-02-15 DIAGNOSIS — M542 Cervicalgia: Secondary | ICD-10-CM | POA: Diagnosis not present

## 2017-02-15 DIAGNOSIS — G540 Brachial plexus disorders: Secondary | ICD-10-CM | POA: Diagnosis not present

## 2017-02-15 DIAGNOSIS — Z7982 Long term (current) use of aspirin: Secondary | ICD-10-CM | POA: Diagnosis not present

## 2017-02-15 DIAGNOSIS — Z79891 Long term (current) use of opiate analgesic: Secondary | ICD-10-CM | POA: Diagnosis not present

## 2017-02-15 DIAGNOSIS — E039 Hypothyroidism, unspecified: Secondary | ICD-10-CM | POA: Diagnosis not present

## 2017-02-15 DIAGNOSIS — I2510 Atherosclerotic heart disease of native coronary artery without angina pectoris: Secondary | ICD-10-CM | POA: Diagnosis not present

## 2017-03-11 DEATH — deceased

## 2017-03-25 ENCOUNTER — Ambulatory Visit: Payer: Medicare Other | Admitting: Cardiology

## 2018-02-09 ENCOUNTER — Ambulatory Visit (INDEPENDENT_AMBULATORY_CARE_PROVIDER_SITE_OTHER): Payer: Medicare Other | Admitting: Internal Medicine

## 2018-03-23 IMAGING — DX DG CHEST 2V
2 series · 2 of 2 positions shown · non-contrast
Comparison: 10/18/2012 radiographs.

CLINICAL DATA: Dyspnea. Heart fluttering for 1 week. History of
atrial fibrillation.

EXAM:
CHEST  2 VIEW

[chest lat]
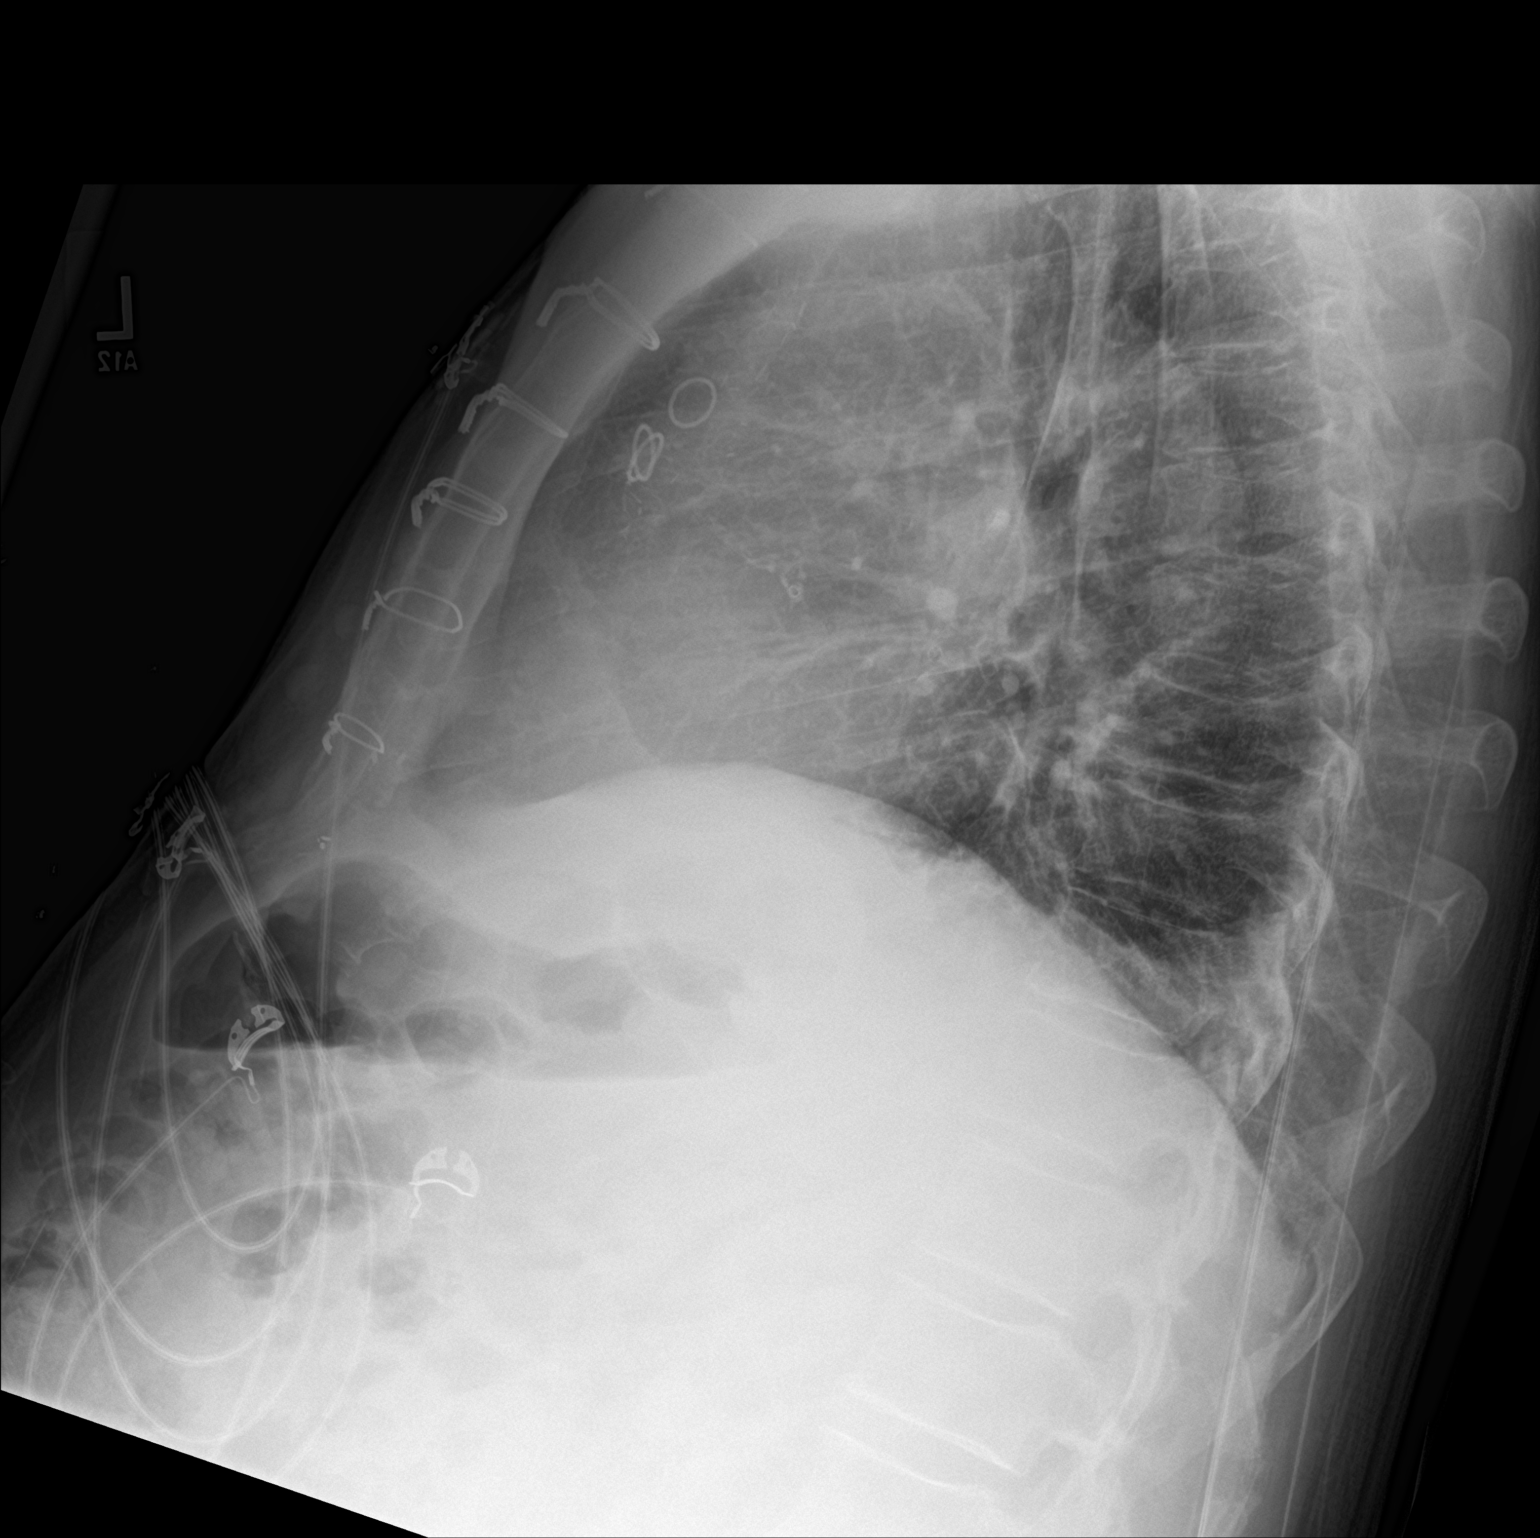

[chest ap]
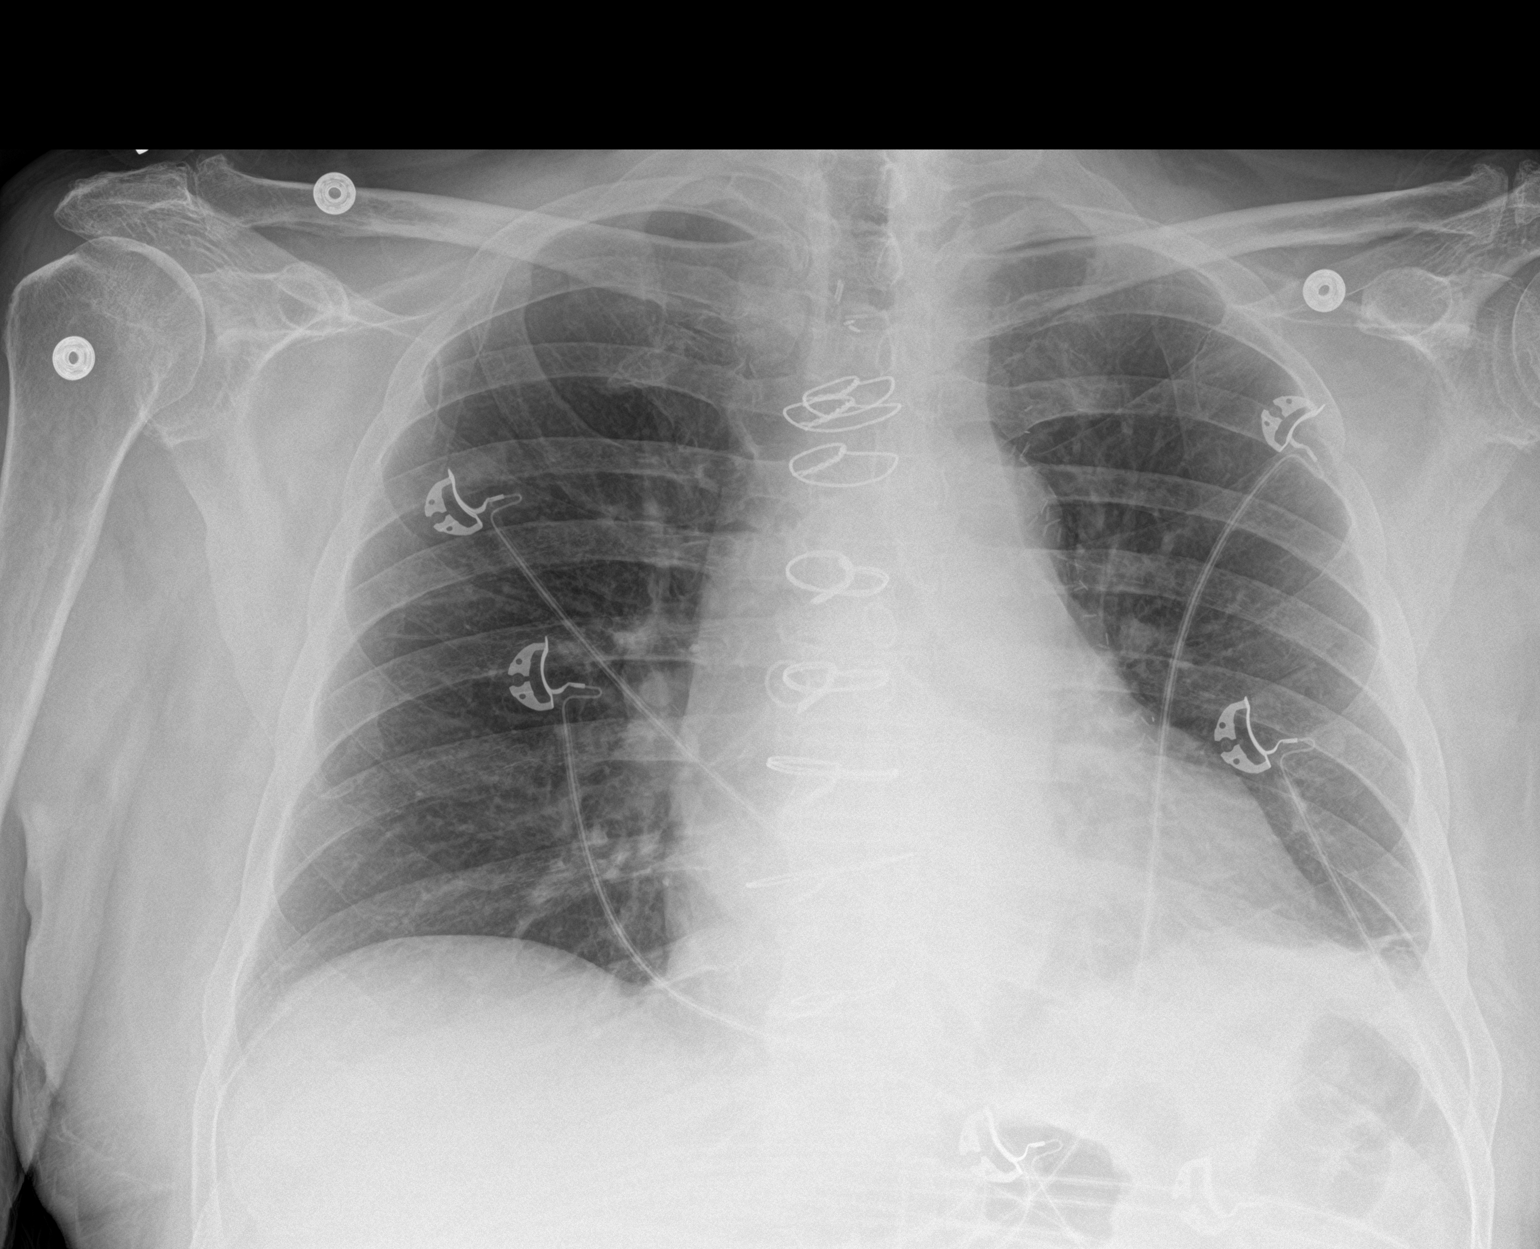

[2 of 2 positions shown; findings below may reference images not displayed]

FINDINGS: The heart size and mediastinal contours are stable status post
median sternotomy and CABG. Left basilar scarring or atelectasis is
similar to the prior study. There is no edema, confluent airspace
opacity, pleural effusion or pneumothorax. No acute osseous findings
are seen.
IMPRESSION: Stable postoperative chest.  No acute cardiopulmonary process.

## 2018-03-31 IMAGING — CR DG CHEST 1V PORT
1 series · 1 of 1 positions shown · non-contrast
Comparison: February 09, 2017

CLINICAL DATA: Shortness of Breath

EXAM:
PORTABLE CHEST 1 VIEW

[portable]
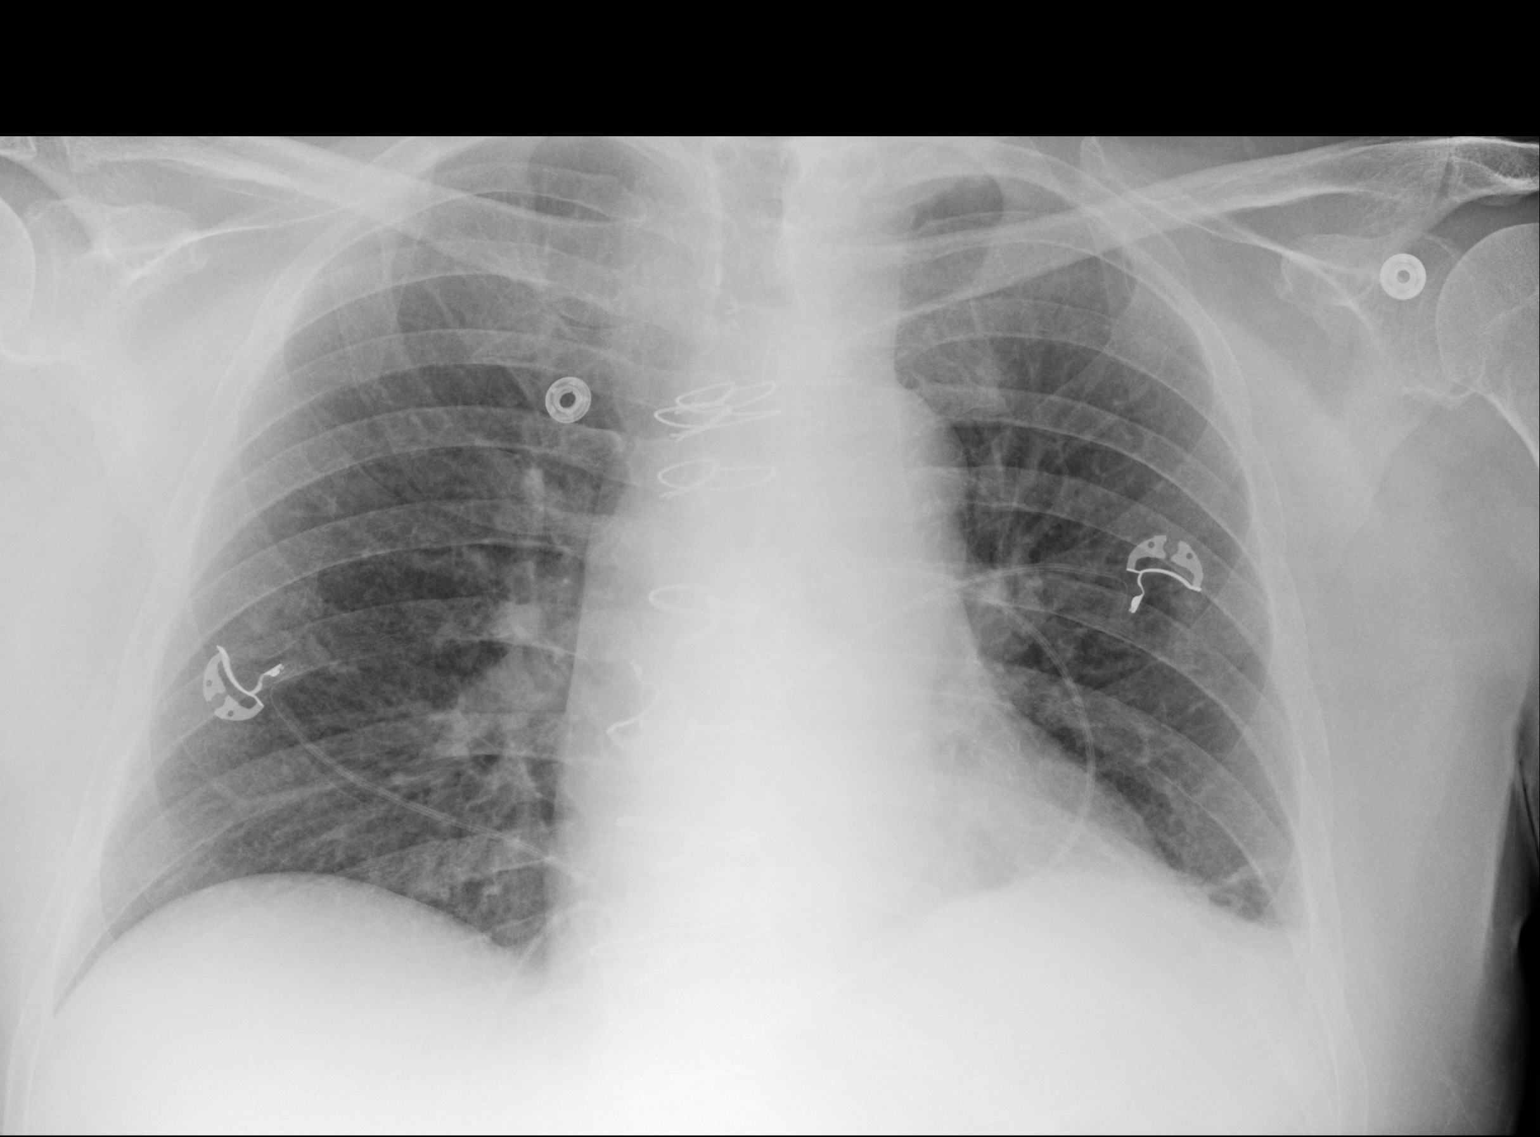

[1 of 1 positions shown; findings below may reference images not displayed]

FINDINGS: There is left base atelectasis with small left pleural effusion.
Lungs elsewhere are clear. Heart is upper normal in size with
pulmonary vascularity within normal limits. No adenopathy. Patient
is status post median sternotomy. No evident bone lesions. No
appreciable pneumothorax.
IMPRESSION: Mild left base atelectasis with small left pleural effusion. Lungs
elsewhere are clear. Stable cardiac silhouette.
